# Patient Record
Sex: Female | Born: 1955
Health system: Southern US, Community
[De-identification: ages and names within clinical notes are randomized; demographics above are authoritative.]

## PROBLEM LIST (undated history)

## (undated) DIAGNOSIS — I10 Essential (primary) hypertension: Secondary | ICD-10-CM

## (undated) DIAGNOSIS — M1990 Unspecified osteoarthritis, unspecified site: Secondary | ICD-10-CM

## (undated) DIAGNOSIS — E039 Hypothyroidism, unspecified: Secondary | ICD-10-CM

## (undated) DIAGNOSIS — G4730 Sleep apnea, unspecified: Secondary | ICD-10-CM

## (undated) DIAGNOSIS — T7840XA Allergy, unspecified, initial encounter: Secondary | ICD-10-CM

## (undated) DIAGNOSIS — K432 Incisional hernia without obstruction or gangrene: Secondary | ICD-10-CM

## (undated) DIAGNOSIS — R140 Abdominal distension (gaseous): Secondary | ICD-10-CM

## (undated) DIAGNOSIS — E079 Disorder of thyroid, unspecified: Secondary | ICD-10-CM

## (undated) DIAGNOSIS — F419 Anxiety disorder, unspecified: Secondary | ICD-10-CM

## (undated) DIAGNOSIS — J189 Pneumonia, unspecified organism: Secondary | ICD-10-CM

## (undated) DIAGNOSIS — K219 Gastro-esophageal reflux disease without esophagitis: Secondary | ICD-10-CM

## (undated) DIAGNOSIS — J302 Other seasonal allergic rhinitis: Secondary | ICD-10-CM

## (undated) DIAGNOSIS — E785 Hyperlipidemia, unspecified: Secondary | ICD-10-CM

## (undated) DIAGNOSIS — E119 Type 2 diabetes mellitus without complications: Secondary | ICD-10-CM

## (undated) DIAGNOSIS — R011 Cardiac murmur, unspecified: Secondary | ICD-10-CM

## (undated) HISTORY — DX: Anxiety disorder, unspecified: F41.9

## (undated) HISTORY — DX: Allergy, unspecified, initial encounter: T78.40XA

## (undated) HISTORY — DX: Essential (primary) hypertension: I10

## (undated) HISTORY — PX: CARDIAC CATHETERIZATION: SHX172

## (undated) HISTORY — DX: Disorder of thyroid, unspecified: E07.9

## (undated) HISTORY — DX: Abdominal distension (gaseous): R14.0

## (undated) HISTORY — DX: Incisional hernia without obstruction or gangrene: K43.2

## (undated) HISTORY — DX: Cardiac murmur, unspecified: R01.1

## (undated) HISTORY — DX: Unspecified osteoarthritis, unspecified site: M19.90

## (undated) HISTORY — DX: Other seasonal allergic rhinitis: J30.2

## (undated) HISTORY — DX: Type 2 diabetes mellitus without complications: E11.9

## (undated) HISTORY — DX: Gastro-esophageal reflux disease without esophagitis: K21.9

## (undated) HISTORY — DX: Hyperlipidemia, unspecified: E78.5

---

## 1980-08-24 HISTORY — PX: APPENDECTOMY: SHX54

## 1999-01-14 ENCOUNTER — Ambulatory Visit (HOSPITAL_COMMUNITY): Admission: RE | Admit: 1999-01-14 | Discharge: 1999-01-14 | Payer: Self-pay | Admitting: Internal Medicine

## 1999-01-21 ENCOUNTER — Encounter: Payer: Self-pay | Admitting: *Deleted

## 1999-01-21 ENCOUNTER — Inpatient Hospital Stay (HOSPITAL_COMMUNITY): Admission: EM | Admit: 1999-01-21 | Discharge: 1999-01-23 | Payer: Self-pay | Admitting: *Deleted

## 1999-11-18 ENCOUNTER — Emergency Department (HOSPITAL_COMMUNITY): Admission: EM | Admit: 1999-11-18 | Discharge: 1999-11-18 | Payer: Self-pay | Admitting: Emergency Medicine

## 2001-11-24 ENCOUNTER — Ambulatory Visit (HOSPITAL_BASED_OUTPATIENT_CLINIC_OR_DEPARTMENT_OTHER): Admission: RE | Admit: 2001-11-24 | Discharge: 2001-11-24 | Payer: Self-pay | Admitting: Internal Medicine

## 2002-08-24 HISTORY — PX: HERNIA REPAIR: SHX51

## 2002-10-04 ENCOUNTER — Other Ambulatory Visit: Admission: RE | Admit: 2002-10-04 | Discharge: 2002-10-04 | Payer: Self-pay | Admitting: Obstetrics and Gynecology

## 2002-10-18 ENCOUNTER — Ambulatory Visit: Admission: RE | Admit: 2002-10-18 | Discharge: 2002-10-18 | Payer: Self-pay | Admitting: General Surgery

## 2002-10-30 ENCOUNTER — Encounter (HOSPITAL_BASED_OUTPATIENT_CLINIC_OR_DEPARTMENT_OTHER): Payer: Self-pay | Admitting: General Surgery

## 2002-10-30 ENCOUNTER — Ambulatory Visit (HOSPITAL_COMMUNITY): Admission: RE | Admit: 2002-10-30 | Discharge: 2002-10-30 | Payer: Self-pay | Admitting: General Surgery

## 2002-10-30 ENCOUNTER — Encounter (INDEPENDENT_AMBULATORY_CARE_PROVIDER_SITE_OTHER): Payer: Self-pay | Admitting: *Deleted

## 2003-07-25 ENCOUNTER — Emergency Department (HOSPITAL_COMMUNITY): Admission: EM | Admit: 2003-07-25 | Discharge: 2003-07-25 | Payer: Self-pay | Admitting: Emergency Medicine

## 2004-08-24 HISTORY — PX: KNEE SURGERY: SHX244

## 2005-02-02 ENCOUNTER — Other Ambulatory Visit: Admission: RE | Admit: 2005-02-02 | Discharge: 2005-02-02 | Payer: Self-pay | Admitting: Obstetrics and Gynecology

## 2005-04-06 ENCOUNTER — Ambulatory Visit: Payer: Self-pay | Admitting: Internal Medicine

## 2005-11-13 ENCOUNTER — Ambulatory Visit: Payer: Self-pay | Admitting: Internal Medicine

## 2006-04-27 ENCOUNTER — Ambulatory Visit: Payer: Self-pay | Admitting: Internal Medicine

## 2006-07-26 ENCOUNTER — Ambulatory Visit: Payer: Self-pay | Admitting: Pulmonary Disease

## 2006-10-22 ENCOUNTER — Ambulatory Visit: Payer: Self-pay | Admitting: Pulmonary Disease

## 2007-04-08 ENCOUNTER — Encounter: Admission: RE | Admit: 2007-04-08 | Discharge: 2007-04-08 | Payer: Self-pay | Admitting: Obstetrics and Gynecology

## 2007-10-14 ENCOUNTER — Ambulatory Visit: Payer: Self-pay | Admitting: Internal Medicine

## 2007-10-14 DIAGNOSIS — R7309 Other abnormal glucose: Secondary | ICD-10-CM | POA: Insufficient documentation

## 2007-10-14 DIAGNOSIS — I152 Hypertension secondary to endocrine disorders: Secondary | ICD-10-CM | POA: Insufficient documentation

## 2007-10-14 DIAGNOSIS — I10 Essential (primary) hypertension: Secondary | ICD-10-CM

## 2007-10-14 DIAGNOSIS — K432 Incisional hernia without obstruction or gangrene: Secondary | ICD-10-CM | POA: Insufficient documentation

## 2007-10-14 DIAGNOSIS — M1990 Unspecified osteoarthritis, unspecified site: Secondary | ICD-10-CM | POA: Insufficient documentation

## 2007-10-17 ENCOUNTER — Ambulatory Visit: Payer: Self-pay | Admitting: Internal Medicine

## 2007-10-17 DIAGNOSIS — I259 Chronic ischemic heart disease, unspecified: Secondary | ICD-10-CM | POA: Insufficient documentation

## 2007-10-17 DIAGNOSIS — I10 Essential (primary) hypertension: Secondary | ICD-10-CM | POA: Insufficient documentation

## 2007-10-19 LAB — CONVERTED CEMR LAB
ALT: 30 units/L (ref 0–35)
AST: 23 units/L (ref 0–37)
Albumin: 4.1 g/dL (ref 3.5–5.2)
Alkaline Phosphatase: 86 units/L (ref 39–117)
BUN: 10 mg/dL (ref 6–23)
Basophils Absolute: 0.1 10*3/uL (ref 0.0–0.1)
Basophils Relative: 1.3 % — ABNORMAL HIGH (ref 0.0–1.0)
Bilirubin Urine: NEGATIVE
Bilirubin, Direct: 0.1 mg/dL (ref 0.0–0.3)
CO2: 29 meq/L (ref 19–32)
Calcium: 9.4 mg/dL (ref 8.4–10.5)
Chloride: 104 meq/L (ref 96–112)
Cholesterol: 170 mg/dL (ref 0–200)
Creatinine, Ser: 0.6 mg/dL (ref 0.4–1.2)
Eosinophils Absolute: 0.3 10*3/uL (ref 0.0–0.6)
Eosinophils Relative: 4.6 % (ref 0.0–5.0)
GFR calc Af Amer: 136 mL/min
GFR calc non Af Amer: 112 mL/min
Glucose, Bld: 109 mg/dL — ABNORMAL HIGH (ref 70–99)
HCT: 43.4 % (ref 36.0–46.0)
HDL: 39.4 mg/dL (ref >39.0)
Hemoglobin, Urine: NEGATIVE
Hemoglobin: 14.6 g/dL (ref 12.0–15.0)
Hgb A1c MFr Bld: 6 % (ref 4.6–6.0)
Ketones, ur: NEGATIVE mg/dL
LDL Cholesterol: 102 mg/dL — ABNORMAL HIGH (ref 0–99)
Leukocytes, UA: NEGATIVE
Lymphocytes Relative: 35.5 % (ref 12.0–46.0)
MCHC: 33.8 g/dL (ref 30.0–36.0)
MCV: 85.4 fL (ref 78.0–100.0)
Monocytes Absolute: 0.4 10*3/uL (ref 0.2–0.7)
Monocytes Relative: 6.5 % (ref 3.0–11.0)
Neutro Abs: 3 10*3/uL (ref 1.4–7.7)
Neutrophils Relative %: 52.1 % (ref 43.0–77.0)
Nitrite: NEGATIVE
Platelets: 242 10*3/uL (ref 150–400)
Potassium: 4.4 meq/L (ref 3.5–5.1)
RBC: 5.08 M/uL (ref 3.87–5.11)
RDW: 13 % (ref 11.5–14.6)
Sodium: 139 meq/L (ref 135–145)
Specific Gravity, Urine: 1.025 (ref 1.000–1.03)
TSH: 2.87 microintl units/mL (ref 0.35–5.50)
Total Bilirubin: 0.7 mg/dL (ref 0.3–1.2)
Total CHOL/HDL Ratio: 4.3
Total Protein, Urine: NEGATIVE mg/dL
Total Protein: 7.3 g/dL (ref 6.0–8.3)
Triglycerides: 142 mg/dL (ref 0–149)
Urine Glucose: NEGATIVE mg/dL
Urobilinogen, UA: 0.2 (ref 0.0–1.0)
VLDL: 28 mg/dL (ref 0–40)
WBC: 5.9 10*3/uL (ref 4.5–10.5)
pH: 5.5 (ref 5.0–8.0)

## 2008-05-30 ENCOUNTER — Encounter: Payer: Self-pay | Admitting: Internal Medicine

## 2008-08-24 HISTORY — PX: CHOLECYSTECTOMY: SHX55

## 2009-05-17 ENCOUNTER — Encounter: Admission: RE | Admit: 2009-05-17 | Discharge: 2009-05-17 | Payer: Self-pay | Admitting: General Surgery

## 2009-07-01 ENCOUNTER — Ambulatory Visit (HOSPITAL_COMMUNITY): Admission: RE | Admit: 2009-07-01 | Discharge: 2009-07-02 | Payer: Self-pay | Admitting: General Surgery

## 2009-09-01 ENCOUNTER — Observation Stay (HOSPITAL_COMMUNITY): Admission: EM | Admit: 2009-09-01 | Discharge: 2009-09-01 | Payer: Self-pay | Admitting: Emergency Medicine

## 2009-09-01 ENCOUNTER — Encounter (INDEPENDENT_AMBULATORY_CARE_PROVIDER_SITE_OTHER): Payer: Self-pay | Admitting: Emergency Medicine

## 2009-09-01 ENCOUNTER — Ambulatory Visit: Payer: Self-pay | Admitting: Vascular Surgery

## 2009-10-22 ENCOUNTER — Encounter (INDEPENDENT_AMBULATORY_CARE_PROVIDER_SITE_OTHER): Payer: Self-pay | Admitting: *Deleted

## 2010-09-25 NOTE — Letter (Signed)
Summary: LEC Referral (unable to schedule) Notification  [facility]  [address] [place], [place] [address]   Phone: [phone]  Fax: [phone]      October 22, 2009 Katherine Sanchez 1956-07-13 MRN: [id number]   [facility] [address] [place], [place]  [address]   Dear A. MCCLUNG, PA-C:   Thank you for your kind referral of the above patient. We have attempted to schedule the recommended COLONOSCOPY but have been unable to schedule because:  _X_ The patient was not available by phone and/or has not returned our calls.  __ The patient declined to schedule the procedure at this time.  We appreciate the referral and hope that we will have the opportunity to treat this patient in the future.    Sincerely,   [facility]  Vania Rea. Jarold Motto M.D. Hedwig Morton. Juanda Chance M.D. Venita Lick. Russella Dar M.D. Wilhemina Bonito. Marina Goodell M.D. Barbette Hair. Arlyce Dice M.D. Iva Boop M.D. Cheron Every.D.

## 2010-11-26 LAB — DIFFERENTIAL
Basophils Absolute: 0 10*3/uL (ref 0.0–0.1)
Lymphocytes Relative: 36 % (ref 12–46)
Monocytes Absolute: 0.4 10*3/uL (ref 0.1–1.0)
Neutro Abs: 2.8 10*3/uL (ref 1.7–7.7)

## 2010-11-26 LAB — CBC
Hemoglobin: 14.6 g/dL (ref 12.0–15.0)
Platelets: 234 10*3/uL (ref 150–400)
RDW: 12.3 % (ref 11.5–15.5)

## 2010-11-26 LAB — BASIC METABOLIC PANEL
Calcium: 9.6 mg/dL (ref 8.4–10.5)
GFR calc Af Amer: >60 mL/min (ref >60)
GFR calc non Af Amer: >60 mL/min (ref >60)
Glucose, Bld: 111 mg/dL — ABNORMAL HIGH (ref 70–99)
Sodium: 139 mEq/L (ref 135–145)

## 2011-01-09 NOTE — Assessment & Plan Note (Signed)
[facility]                             PULMONARY OFFICE NOTE   NAME:Katherine Sanchez, Katherine Sanchez                    MRN:          [id number]  DATE:07/26/2006                            DOB:          [birth year]    REFERRING PHYSICIAN:  Self   HISTORY OF PRESENT ILLNESS:  The patient is a 55 year old female who  comes in today on self referral for evaluation of obstructive sleep  apnea. The patient was diagnosed with mild sleep apnea in 2003 and  decided to work on weight loss in order to treat this. I have not seen  the patient since that time and comes in today where her story has  gotten much worse. She has been having pauses in her breathing during  sleep as well as startles during the night and frequent awakenings. She  typically goes to bed between 11 and 6 a.m. and gets up at 6:15 to start  her day. It typically takes her approximately 30 minutes to fall asleep.  The patient states that she will fall asleep with any inactivity in the  evenings and has only rare sleepiness with driving. She denies any leg  jerks or other restless leg syndrome symptoms. Of note, the patient's  weight is up about 4 pounds since the last visit.   PAST MEDICAL HISTORY:  1. Hypertension.  2. History of dysrhythmias.  3. Status post appendectomy.  4. History of umbilical hernia repair.   CURRENT MEDICATIONS:  1. Lotrel 10/20 one daily.  2. Lorazepam 1 mg daily.  3. Aspirin 81 mg daily.   She has no known drug allergies.   SOCIAL HISTORY:  She has never smoked, she is married and has children.   FAMILY HISTORY:  Remarkable for her son having had asthma otherwise no  history on first degree relatives.   REVIEW OF SYSTEMS:  As per history of present illness. Also see patient  intake form documented in the chart.   PHYSICAL EXAMINATION:  GENERAL:  She is an overweight female in no acute  distress.  VITAL SIGNS:  Blood pressure is 118/76, pulse 66, temperature is 98,  weight is 199 pounds. O2 saturations on room air is 97%.  HEENT:  Pupils equal round and reactive to light and accommodation.  Extraocular muscles are intact. Nares shows mild septal deviation to the  left and there is elongation of the soft palate and uvula.  NECK:  Supple without JVD or lymphadenopathy. There is no palpable  thyromegaly.  CHEST:  Totally clear.  CARDIAC:  Reveals regular rate and rhythm. No murmurs, rubs or gallops.  ABDOMEN:  Soft, nontender with good bowel sounds.  GENITAL/RECTAL/BREASTS:  Not done and not indicated.  EXTREMITIES:  Lower extremities are without edema, good pulses distally,  no calf tenderness.  NEUROLOGIC:  Alert and oriented with no evidence of motor deficits.   IMPRESSION:  History of obstructive sleep apnea which was mild in  nature. Certainly based on her current symptomatology, I would think  that her degree of sleep apnea has worsened. At this point in time, she  is willing to try  CPAP and will go ahead and initiate her on this. She  also needs to work aggressively on weight loss.   PLAN:  1. Initial CPAP at 10 cm.  2. Follow up in 4 weeks or sooner if there are problems.  3. The patient will eventually need pressure optimization.  4. Work on weight loss.     Barbaraann Share, MD,FCCP  Electronically Signed    [facility]  DD: 08/31/2006  DT: 09/01/2006  Job #: [id number]

## 2011-01-09 NOTE — Op Note (Signed)
   NAME:  Katherine Sanchez, Katherine Sanchez                       ACCOUNT NO.:  [id number]   MEDICAL RECORD NO.:  [id number]                   PATIENT TYPE:  OIB   LOCATION:  2860                                 FACILITY:  [facility]   PHYSICIAN:  Leonie Man, M.D.                DATE OF BIRTH:  [birth year]   DATE OF PROCEDURE:  10/30/2002  DATE OF DISCHARGE:                                 OPERATIVE REPORT   PREOPERATIVE DIAGNOSIS:  Incarcerated umbilical hernia.   POSTOPERATIVE DIAGNOSIS:  Incarcerated umbilical hernia.   PROCEDURE:  Repair of incarcerated umbilical hernia with mesh.   SURGEON:  Leonie Man, M.D.   ASSISTANT:  Magnus Ivan, RN FA.   ANESTHESIA:  General.   INDICATIONS FOR PROCEDURE:  The patient is a 55 year old woman who presented  with umbilical pain, no symptoms of bowel obstruction, but with a persistent  umbilical mass which is tender.  She comes to the operating room after the  risks and potential benefits of surgery are fully discussed.  All questions  answered and consent obtained.   DESCRIPTION OF PROCEDURE:  Following induction of satisfactory general  anesthesia, the patient is positioned supinely. The abdomen was prepped and  draped routinely to be included in a sterile operative field.  I infiltrated  the repair umbilical region with 0.5% Marcaine with epinephrine. An  infraumbilical incision is made in a semicircular fashion, raising the  umbilical skin as a flap, carrying it cephalad, and dissecting the hernia  sac from off of the umbilicus and carrying the dissection down and around  the hernia sac on all sides down to the fascia.  The fascia is scored at its  base and the umbilical hernia sac is removed in its entirety along with a  portion of the incarcerated omentum, which was divided with electrocautery.  The remainder of the omentum was then reduced into the peritoneal cavity.  The defect is repaired with a mesh plug placed into the defect and using  interrupted 0 Novafil sutures, sewing the mesh into the defect.  At the end  of the repair, the repair was felt to be intact.  Needle, sponge, and  instrument count correct.  The subcutaneous tissues were closed with a  running 2-0 Vicryl suture and the skin was closed with a running 4-0  Monocryl suture reinforced with Steri-Strips. A sterile dressing was  applied. The anesthetic was reversed. The patient was removed from the  operating room to the recovery room in stable condition. She tolerated the  procedure well.                                               Leonie Man, M.D.    PB/MEDQ  D:  10/30/2002  T:  10/30/2002  Job:  [id number]

## 2011-07-08 ENCOUNTER — Ambulatory Visit (INDEPENDENT_AMBULATORY_CARE_PROVIDER_SITE_OTHER): Payer: Federal, State, Local not specified - PPO | Admitting: Surgery

## 2011-07-08 ENCOUNTER — Encounter (INDEPENDENT_AMBULATORY_CARE_PROVIDER_SITE_OTHER): Payer: Self-pay | Admitting: Surgery

## 2011-07-08 VITALS — BP 158/96 | HR 80 | Temp 98.2°F | Resp 18 | Ht 62.0 in | Wt 217.8 lb

## 2011-07-08 DIAGNOSIS — E669 Obesity, unspecified: Secondary | ICD-10-CM | POA: Insufficient documentation

## 2011-07-08 DIAGNOSIS — K432 Incisional hernia without obstruction or gangrene: Secondary | ICD-10-CM

## 2011-07-08 NOTE — Progress Notes (Signed)
Subjective:     Patient ID: Katherine Sanchez, female   DOB: [birth year], 55 y.o.   MRN: [id number]  HPI  Patient Care Team: Sonda Primes, MD as PCP - General  This patient is a 55 y.o.female who presents today for surgical evaluation.   Reason for visit: Recurrent periumbilical swelling. Concern of recurrent hernia.  Patient is a pleasant obese female who's had a periumbilical ventral hernia. She had an open repair with mesh in 2004. She had a half cholecystectomy and primary re-repair in 2010. She had to take care of her ailing mother last year. This required a lot of lifting. She worries that a hernia came back after that point.  She notes that she has pain and swelling with activity.  She has been trying to lose weight. She woke her dogs several times a day. They have adjusted to a low fiber discharge diet. She has been losing about the 10 pounds over the past month. However, while her activities better after her knee surgery she still limited secondary to this pain. She wishes to have consideration of surgery for repair.  She has some friends whom are nurses that recommended me.  She walks regularly without difficulty. Daily bowel movements on a fiber regimen. No history of skin infections.  Past Medical History  Diagnosis Date  . Arthritis   . Hypertension   . Hyperlipidemia   . Thyroid disease   . Heart murmur   . Abdominal distension   . Constipation   . Incisional hernia     Past Surgical History  Procedure Date  . Appendectomy 1982  . Knee surgery 2006    left - arthroscopic  . Cholecystectomy 2010    lap with open redo umbilical hernia repair.  Dr. Zachery Dakins  . Hernia repair 2004    umbilical open w mesh plug. Dr. Lurene Shadow    History   Social History  . Marital Status: Married    Spouse Name: N/A    Number of Children: N/A  . Years of Education: N/A   Occupational History  . Not on file.   Social History Main Topics  . Smoking status: Former Smoker   Quit date: 07/07/1978  . Smokeless tobacco: Never Used  . Alcohol Use: No  . Drug Use: No  . Sexually Active: Not on file   Other Topics Concern  . Not on file   Social History Narrative  . No narrative on file    Family History  Problem Relation Age of Onset  . Cancer Mother     colon  . Heart disease Mother 94    congestive heart failure  . Other Father 58    car accident  . Cancer Maternal Grandmother     ovarian    Current outpatient prescriptions:levothyroxine (SYNTHROID, LEVOTHROID) 25 MCG tablet, Daily., Disp: , Rfl: ;  meloxicam (MOBIC) 15 MG tablet, as needed., Disp: , Rfl: ;  simvastatin (ZOCOR) 10 MG tablet, Take 10 mg by mouth at bedtime.  , Disp: , Rfl: ;  amLODipine-benazepril (LOTREL) 10-20 MG per capsule, 1 capsule daily. , Disp: , Rfl:   No Known Allergies     Review of Systems  Constitutional: Negative for fever, chills, diaphoresis, appetite change and fatigue.  HENT: Negative for ear pain, sore throat, trouble swallowing, neck pain and ear discharge.   Eyes: Negative for photophobia, discharge and visual disturbance.  Respiratory: Negative for cough, choking, chest tightness and shortness of breath.   Cardiovascular: Negative for chest  pain and palpitations.  Gastrointestinal: Positive for abdominal pain. Negative for nausea, vomiting, diarrhea, constipation, blood in stool, abdominal distention, anal bleeding and rectal pain.  Genitourinary: Negative for dysuria, frequency and difficulty urinating.  Musculoskeletal: Negative for myalgias and gait problem.  Skin: Negative for color change, pallor and rash.  Neurological: Negative for dizziness, speech difficulty, weakness and numbness.  Hematological: Negative for adenopathy.  Psychiatric/Behavioral: Negative for confusion and agitation. The patient is not nervous/anxious.        Objective:   Physical Exam  Constitutional: She is oriented to person, place, and time. She appears well-developed and  well-nourished. No distress.  HENT:  Head: Normocephalic.  Mouth/Throat: Oropharynx is clear and moist. No oropharyngeal exudate.  Eyes: Conjunctivae and EOM are normal. Pupils are equal, round, and reactive to light. No scleral icterus.  Neck: Normal range of motion. Neck supple. No tracheal deviation present.  Cardiovascular: Normal rate, regular rhythm and intact distal pulses.   Pulmonary/Chest: Effort normal and breath sounds normal. No respiratory distress. She exhibits no tenderness.  Abdominal: Soft. She exhibits mass. She exhibits no distension. There is tenderness. Hernia confirmed negative in the right inguinal area and confirmed negative in the left inguinal area.       6x6cm periumb mass - reduces while supine.  +TTP  Morbidly obese w panniculis  Genitourinary: No vaginal discharge found.  Musculoskeletal: Normal range of motion. She exhibits no tenderness.  Lymphadenopathy:    She has no cervical adenopathy.       Right: No inguinal adenopathy present.       Left: No inguinal adenopathy present.  Neurological: She is alert and oriented to person, place, and time. No cranial nerve deficit. She exhibits normal muscle tone. Coordination normal.  Skin: Skin is warm and dry. No rash noted. She is not diaphoretic. No erythema.  Psychiatric: Her behavior is normal. Judgment and thought content normal.       Pleasant, tearful talking about her recently deceased mother but consolable       Assessment:     Recurrent VWH    Plan:     She would benefit from repair. I think he would be good to do a laparoscopic approach to make sure that she does not have multiple small Swiss cheese hernias. I suspect given her obesity and size that she will be seen an overnight stay. I spent some time trying to console her about the death of her mother. I noted that she had to do what she needed take care of her mother. Hopefully, we can help take care of this problem. I agreed with her efforts to  lose weight and have a healthier lifestyle.  The anatomy & physiology of the abdominal wall was discussed.  The pathophysiology of hernias was discussed.  Natural history risks without surgery of enlargement, pain, incarceration & strangulation was discussed.   Contributors to complications such as smoking, obesity, diabetes, prior surgery, etc were discussed.  I feel the risks of no intervention will lead to serious problems that outweigh the operative risks; therefore, I recommended surgery to reduce and repair the hernia.  I explained laparoscopic techniques with possible need for an open approach.  I noted the probable use of mesh to patch and/or buttress hernia repair  Risks such as bleeding, infection, abscess, need for further treatment, heart attack, death, and other risks were discussed.  Goals of post-operative recovery were discussed as well.  Possibility that this will not correct all symptoms was explained.  I stressed the importance of low-impact activity, aggressive pain control, avoiding constipation, & not pushing through pain to minimize risk of post-operative chronic pain or injury. Possibility of reherniation was discussed.  We will work to minimize complications.   An educational handout further explaining the pathology & treatment options was given as well.  Questions were answered.  The patient expresses understanding & wishes to proceed with surgery.

## 2011-08-06 ENCOUNTER — Encounter (HOSPITAL_COMMUNITY): Payer: Self-pay | Admitting: Pharmacy Technician

## 2011-08-11 ENCOUNTER — Other Ambulatory Visit: Payer: Self-pay

## 2011-08-11 ENCOUNTER — Encounter (HOSPITAL_COMMUNITY)
Admission: RE | Admit: 2011-08-11 | Discharge: 2011-08-11 | Disposition: A | Discharge status: Home / Self Care | Payer: Federal, State, Local not specified - PPO | Source: Ambulatory Visit | Attending: Surgery | Admitting: Surgery

## 2011-08-11 ENCOUNTER — Encounter (HOSPITAL_COMMUNITY): Payer: Self-pay

## 2011-08-11 ENCOUNTER — Ambulatory Visit (HOSPITAL_COMMUNITY)
Admission: RE | Admit: 2011-08-11 | Discharge: 2011-08-11 | Disposition: A | Discharge status: Home / Self Care | Payer: Federal, State, Local not specified - PPO | Source: Ambulatory Visit | Attending: Surgery | Admitting: Surgery

## 2011-08-11 DIAGNOSIS — Z01812 Encounter for preprocedural laboratory examination: Secondary | ICD-10-CM | POA: Insufficient documentation

## 2011-08-11 DIAGNOSIS — R0789 Other chest pain: Secondary | ICD-10-CM | POA: Insufficient documentation

## 2011-08-11 DIAGNOSIS — M5380 Other specified dorsopathies, site unspecified: Secondary | ICD-10-CM | POA: Insufficient documentation

## 2011-08-11 DIAGNOSIS — K439 Ventral hernia without obstruction or gangrene: Secondary | ICD-10-CM | POA: Insufficient documentation

## 2011-08-11 DIAGNOSIS — Z01810 Encounter for preprocedural cardiovascular examination: Secondary | ICD-10-CM | POA: Insufficient documentation

## 2011-08-11 DIAGNOSIS — I10 Essential (primary) hypertension: Secondary | ICD-10-CM | POA: Insufficient documentation

## 2011-08-11 HISTORY — DX: Sleep apnea, unspecified: G47.30

## 2011-08-11 HISTORY — DX: Hypothyroidism, unspecified: E03.9

## 2011-08-11 HISTORY — DX: Pneumonia, unspecified organism: J18.9

## 2011-08-11 LAB — SURGICAL PCR SCREEN: MRSA, PCR: NEGATIVE

## 2011-08-11 NOTE — Pre-Procedure Instructions (Signed)
07/22/11 CBC  With DIFF and CMP on chart

## 2011-08-11 NOTE — Pre-Procedure Instructions (Signed)
08/11/11 Instructed pt to bring CPA mask and tubing day of surgery .

## 2011-08-11 NOTE — Pre-Procedure Instructions (Signed)
08/11/11 most recent office visit note for pulmonary - Dr Shelle Iron who handles CPAP is 07/26/2006 - on chart - CPAP settings are within note.  I called office to verify there was no updated info. None available per office personnel.

## 2011-08-11 NOTE — Patient Instructions (Signed)
20 Katherine Sanchez  08/11/2011   Your procedure is scheduled on:  08/13/2011 0800am-0950am  Report to [facility] Stay [facility] at 0600 AM.  Call this number if you have problems the morning of surgery: [phone]   Remember:   Do not eat food:After Midnight.  May have clear liquids:until Midnight .  Clear liquids include soda, tea, black coffee, apple or grape juice, broth.  Take these medicines the morning of surgery with A SIP OF WATER:    Do not wear jewelry, make-up or nail polish.  Do not wear lotions, powders, or perfumes.  Do not shave 48 hours prior to surgery.  Do not bring valuables to the hospital.  Contacts, dentures or bridgework may not be worn into surgery.  Leave suitcase in the car. After surgery it may be brought to your room.  For patients admitted to the hospital, checkout time is 11:00 AM the day of discharge.      Special Instructions: CHG Shower Use Special Wash: 1/2 bottle night before surgery and 1/2 bottle morning of surgery. shower chin to toes with CHG. Wash face and private parts with regular soap.    Please read over the following fact sheets that you were given: MRSA Information, coughing and deep breathing exercises, leg exercises

## 2011-08-13 ENCOUNTER — Encounter (HOSPITAL_COMMUNITY): Payer: Self-pay | Admitting: Anesthesiology

## 2011-08-13 ENCOUNTER — Encounter (HOSPITAL_COMMUNITY): Payer: Self-pay

## 2011-08-13 ENCOUNTER — Ambulatory Visit (HOSPITAL_COMMUNITY): Payer: Federal, State, Local not specified - PPO | Admitting: Anesthesiology

## 2011-08-13 ENCOUNTER — Observation Stay (HOSPITAL_COMMUNITY)
Admission: RE | Admit: 2011-08-13 | Discharge: 2011-08-14 | Disposition: A | Discharge status: Home / Self Care | Payer: Federal, State, Local not specified - PPO | Source: Ambulatory Visit | Attending: Surgery | Admitting: Surgery

## 2011-08-13 ENCOUNTER — Encounter (HOSPITAL_COMMUNITY)
Admission: RE | Disposition: A | Discharge status: Home / Self Care | Payer: Self-pay | Source: Ambulatory Visit | Attending: Surgery

## 2011-08-13 DIAGNOSIS — R109 Unspecified abdominal pain: Secondary | ICD-10-CM | POA: Insufficient documentation

## 2011-08-13 DIAGNOSIS — K420 Umbilical hernia with obstruction, without gangrene: Principal | ICD-10-CM | POA: Insufficient documentation

## 2011-08-13 DIAGNOSIS — G4730 Sleep apnea, unspecified: Secondary | ICD-10-CM | POA: Insufficient documentation

## 2011-08-13 DIAGNOSIS — I10 Essential (primary) hypertension: Secondary | ICD-10-CM | POA: Diagnosis present

## 2011-08-13 DIAGNOSIS — E785 Hyperlipidemia, unspecified: Secondary | ICD-10-CM | POA: Insufficient documentation

## 2011-08-13 DIAGNOSIS — M1990 Unspecified osteoarthritis, unspecified site: Secondary | ICD-10-CM | POA: Diagnosis present

## 2011-08-13 DIAGNOSIS — K432 Incisional hernia without obstruction or gangrene: Secondary | ICD-10-CM | POA: Diagnosis present

## 2011-08-13 DIAGNOSIS — K436 Other and unspecified ventral hernia with obstruction, without gangrene: Secondary | ICD-10-CM

## 2011-08-13 DIAGNOSIS — E039 Hypothyroidism, unspecified: Secondary | ICD-10-CM | POA: Insufficient documentation

## 2011-08-13 HISTORY — PX: VENTRAL HERNIA REPAIR: SHX424

## 2011-08-13 LAB — CBC
HCT: 40.1 % (ref 36.0–46.0)
MCV: 83.7 fL (ref 78.0–100.0)
RBC: 4.79 MIL/uL (ref 3.87–5.11)
WBC: 10.2 10*3/uL (ref 4.0–10.5)

## 2011-08-13 LAB — CREATININE, SERUM: GFR calc Af Amer: >90 mL/min (ref >90)

## 2011-08-13 SURGERY — REPAIR, HERNIA, VENTRAL, LAPAROSCOPIC
Anesthesia: General | Site: Abdomen | Wound class: Clean

## 2011-08-13 MED ORDER — LACTATED RINGERS IV SOLN
INTRAVENOUS | Status: DC
  Administered 2011-08-13: 75 mL via INTRAVENOUS

## 2011-08-13 MED ORDER — TETRAHYDROZOLINE HCL 0.05 % OP SOLN
1.0000 [drp] | Freq: Two times a day (BID) | OPHTHALMIC | Status: DC | PRN
  Filled 2011-08-13: qty 15

## 2011-08-13 MED ORDER — KETOROLAC TROMETHAMINE 30 MG/ML IJ SOLN
INTRAMUSCULAR | Status: DC | PRN
  Administered 2011-08-13: 30 mg via INTRAVENOUS

## 2011-08-13 MED ORDER — NEOSTIGMINE METHYLSULFATE 1 MG/ML IJ SOLN
INTRAMUSCULAR | Status: DC | PRN
  Administered 2011-08-13: 3 mg via INTRAVENOUS

## 2011-08-13 MED ORDER — SUCCINYLCHOLINE CHLORIDE 20 MG/ML IJ SOLN
INTRAMUSCULAR | Status: DC | PRN
  Administered 2011-08-13: 100 mg via INTRAVENOUS

## 2011-08-13 MED ORDER — STERILE WATER FOR IRRIGATION IR SOLN
Status: DC | PRN
  Administered 2011-08-13: 1500 mL

## 2011-08-13 MED ORDER — MIDAZOLAM HCL 5 MG/5ML IJ SOLN
INTRAMUSCULAR | Status: DC | PRN
  Administered 2011-08-13 (×2): 1 mg via INTRAVENOUS

## 2011-08-13 MED ORDER — SIMVASTATIN 20 MG PO TABS
20.0000 mg | ORAL_TABLET | Freq: Every day | ORAL | Status: DC
  Administered 2011-08-13: 20 mg via ORAL
  Filled 2011-08-13 (×2): qty 1

## 2011-08-13 MED ORDER — BUPIVACAINE 0.25 % ON-Q PUMP DUAL CATH 300 ML
300.0000 mL | INJECTION | Status: DC
  Administered 2011-08-13: 300 mL
  Filled 2011-08-13: qty 300

## 2011-08-13 MED ORDER — LACTATED RINGERS IR SOLN
Status: DC | PRN
  Administered 2011-08-13: 1000 mL

## 2011-08-13 MED ORDER — BUPIVACAINE 0.25 % ON-Q PUMP DUAL CATH 300 ML: 300.0000 mL | INJECTION | Status: DC

## 2011-08-13 MED ORDER — GUAIFENESIN-DM 100-10 MG/5ML PO SYRP: 15.0000 mL | ORAL_SOLUTION | ORAL | Status: DC | PRN

## 2011-08-13 MED ORDER — NAPROXEN 500 MG PO TABS
500.0000 mg | ORAL_TABLET | Freq: Two times a day (BID) | ORAL | Status: DC
  Administered 2011-08-13 – 2011-08-14 (×2): 500 mg via ORAL
  Filled 2011-08-13 (×3): qty 1

## 2011-08-13 MED ORDER — HEPARIN SODIUM (PORCINE) 5000 UNIT/ML IJ SOLN
5000.0000 [IU] | Freq: Three times a day (TID) | INTRAMUSCULAR | Status: DC
  Administered 2011-08-13 – 2011-08-14 (×3): 5000 [IU] via SUBCUTANEOUS
  Filled 2011-08-13 (×5): qty 1

## 2011-08-13 MED ORDER — METOPROLOL TARTRATE 1 MG/ML IV SOLN
5.0000 mg | Freq: Four times a day (QID) | INTRAVENOUS | Status: DC | PRN
  Filled 2011-08-13: qty 5

## 2011-08-13 MED ORDER — 0.9 % SODIUM CHLORIDE (POUR BTL) OPTIME
TOPICAL | Status: DC | PRN
  Administered 2011-08-13: 1000 mL

## 2011-08-13 MED ORDER — CEFAZOLIN SODIUM 1-5 GM-% IV SOLN
INTRAVENOUS | Status: DC | PRN
  Administered 2011-08-13: 2 g via INTRAVENOUS

## 2011-08-13 MED ORDER — CISATRACURIUM BESYLATE 2 MG/ML IV SOLN
INTRAVENOUS | Status: DC | PRN
  Administered 2011-08-13: 6 mg via INTRAVENOUS
  Administered 2011-08-13: 2 mg via INTRAVENOUS

## 2011-08-13 MED ORDER — HYDROMORPHONE HCL PF 1 MG/ML IJ SOLN
0.5000 mg | INTRAMUSCULAR | Status: DC | PRN
  Administered 2011-08-13 (×2): 0.5 mg via INTRAVENOUS

## 2011-08-13 MED ORDER — DIPHENHYDRAMINE HCL 50 MG/ML IJ SOLN: 12.5000 mg | Freq: Four times a day (QID) | INTRAMUSCULAR | Status: DC | PRN

## 2011-08-13 MED ORDER — PSYLLIUM 95 % PO PACK
1.0000 | PACK | Freq: Two times a day (BID) | ORAL | Status: DC
  Administered 2011-08-13 – 2011-08-14 (×3): 1 via ORAL
  Filled 2011-08-13 (×4): qty 1

## 2011-08-13 MED ORDER — LORATADINE 10 MG PO TABS
10.0000 mg | ORAL_TABLET | Freq: Every day | ORAL | Status: DC
  Administered 2011-08-13 – 2011-08-14 (×2): 10 mg via ORAL
  Filled 2011-08-13 (×2): qty 1

## 2011-08-13 MED ORDER — LACTATED RINGERS IV SOLN
INTRAVENOUS | Status: DC | PRN
  Administered 2011-08-13 (×2): via INTRAVENOUS

## 2011-08-13 MED ORDER — ONDANSETRON HCL 4 MG/2ML IJ SOLN
INTRAMUSCULAR | Status: DC | PRN
  Administered 2011-08-13 (×2): 1 mg via INTRAVENOUS
  Administered 2011-08-13: 2 mg via INTRAVENOUS

## 2011-08-13 MED ORDER — FENTANYL CITRATE 0.05 MG/ML IJ SOLN
INTRAMUSCULAR | Status: DC | PRN
  Administered 2011-08-13 (×8): 50 ug via INTRAVENOUS

## 2011-08-13 MED ORDER — BUPIVACAINE-EPINEPHRINE 0.25% -1:200000 IJ SOLN
INTRAMUSCULAR | Status: DC | PRN
  Administered 2011-08-13: 50 mL

## 2011-08-13 MED ORDER — ALBUTEROL SULFATE (5 MG/ML) 0.5% IN NEBU: 2.5000 mg | INHALATION_SOLUTION | Freq: Four times a day (QID) | RESPIRATORY_TRACT | Status: DC | PRN

## 2011-08-13 MED ORDER — VITAMIN C 500 MG PO TABS
1000.0000 mg | ORAL_TABLET | Freq: Every day | ORAL | Status: DC
  Administered 2011-08-14: 1000 mg via ORAL
  Filled 2011-08-13: qty 2

## 2011-08-13 MED ORDER — LIP MEDEX EX OINT
1.0000 "application " | TOPICAL_OINTMENT | Freq: Two times a day (BID) | CUTANEOUS | Status: DC
  Administered 2011-08-13 – 2011-08-14 (×3): 1 via TOPICAL
  Filled 2011-08-13: qty 7

## 2011-08-13 MED ORDER — ALUM & MAG HYDROXIDE-SIMETH 200-200-20 MG/5ML PO SUSP: 30.0000 mL | Freq: Four times a day (QID) | ORAL | Status: DC | PRN

## 2011-08-13 MED ORDER — ACETAMINOPHEN 325 MG PO TABS: 325.0000 mg | ORAL_TABLET | Freq: Four times a day (QID) | ORAL | Status: DC | PRN

## 2011-08-13 MED ORDER — CEFAZOLIN SODIUM-DEXTROSE 2-3 GM-% IV SOLR: 2.0000 g | INTRAVENOUS | Status: DC

## 2011-08-13 MED ORDER — PROMETHAZINE HCL 25 MG/ML IJ SOLN: 6.2500 mg | INTRAMUSCULAR | Status: DC | PRN

## 2011-08-13 MED ORDER — GLYCOPYRROLATE 0.2 MG/ML IJ SOLN
INTRAMUSCULAR | Status: DC | PRN
  Administered 2011-08-13: .6 mg via INTRAVENOUS

## 2011-08-13 MED ORDER — EPHEDRINE SULFATE 50 MG/ML IJ SOLN
INTRAMUSCULAR | Status: DC | PRN
  Administered 2011-08-13 (×2): 5 mg via INTRAVENOUS

## 2011-08-13 MED ORDER — HYDROMORPHONE HCL PF 2 MG/ML IJ SOLN
0.5000 mg | INTRAMUSCULAR | Status: DC | PRN
  Administered 2011-08-13 (×2): 2 mg via INTRAVENOUS
  Administered 2011-08-13: 1 mg via INTRAVENOUS
  Administered 2011-08-14 (×2): 2 mg via INTRAVENOUS
  Filled 2011-08-13 (×6): qty 1

## 2011-08-13 MED ORDER — PROPOFOL 10 MG/ML IV EMUL
INTRAVENOUS | Status: DC | PRN
  Administered 2011-08-13: 150 mg via INTRAVENOUS

## 2011-08-13 MED ORDER — OXYCODONE HCL 5 MG PO TABS
5.0000 mg | ORAL_TABLET | ORAL | Status: DC | PRN
  Administered 2011-08-14 (×2): 10 mg via ORAL
  Filled 2011-08-13 (×3): qty 2

## 2011-08-13 MED ORDER — OXYCODONE HCL 5 MG PO TABS: 5.0000 mg | ORAL_TABLET | ORAL | Status: AC | PRN

## 2011-08-13 MED ORDER — DEXAMETHASONE SODIUM PHOSPHATE 10 MG/ML IJ SOLN
INTRAMUSCULAR | Status: DC | PRN
  Administered 2011-08-13: 10 mg via INTRAVENOUS

## 2011-08-13 MED ORDER — MELOXICAM 15 MG PO TABS
15.0000 mg | ORAL_TABLET | Freq: Every day | ORAL | Status: DC | PRN
  Filled 2011-08-13: qty 1

## 2011-08-13 MED ORDER — LIDOCAINE HCL (CARDIAC) 20 MG/ML IV SOLN
INTRAVENOUS | Status: DC | PRN
  Administered 2011-08-13: 80 mg via INTRAVENOUS

## 2011-08-13 MED ORDER — FLORA-Q PO CAPS
1.0000 | ORAL_CAPSULE | Freq: Every day | ORAL | Status: DC
  Administered 2011-08-13 – 2011-08-14 (×2): 1 via ORAL
  Filled 2011-08-13 (×2): qty 1

## 2011-08-13 MED ORDER — LACTATED RINGERS IV SOLN
INTRAVENOUS | Status: DC
  Administered 2011-08-13: 1000 mL via INTRAVENOUS

## 2011-08-13 MED ORDER — LEVOTHYROXINE SODIUM 25 MCG PO TABS
25.0000 ug | ORAL_TABLET | Freq: Every day | ORAL | Status: DC
  Administered 2011-08-14: 25 ug via ORAL
  Filled 2011-08-13: qty 1

## 2011-08-13 SURGICAL SUPPLY — 45 items
APPLIER CLIP 5 13 M/L LIGAMAX5 (MISCELLANEOUS)
BINDER ABD UNIV 12 45-62 (WOUND CARE) ×1 IMPLANT
BINDER ABDOMINAL 46IN 62IN (WOUND CARE) ×2
CANISTER SUCTION 2500CC (MISCELLANEOUS) ×2 IMPLANT
CATH KIT ON-Q SILVERSOAKER 10 (CATHETERS) ×4 IMPLANT
CLIP APPLIE 5 13 M/L LIGAMAX5 (MISCELLANEOUS) IMPLANT
CLOSURE STERI STRIP 1/2 X4 (GAUZE/BANDAGES/DRESSINGS) ×2 IMPLANT
CLOTH BEACON ORANGE TIMEOUT ST (SAFETY) ×2 IMPLANT
DECANTER SPIKE VIAL GLASS SM (MISCELLANEOUS) ×2 IMPLANT
DEVICE SECURE STRAP 25 ABSORB (INSTRUMENTS) ×4 IMPLANT
DEVICE TROCAR PUNCTURE CLOSURE (ENDOMECHANICALS) ×2 IMPLANT
DRAPE LAPAROSCOPIC ABDOMINAL (DRAPES) ×2 IMPLANT
DRSG TEGADERM 2-3/8X2-3/4 SM (GAUZE/BANDAGES/DRESSINGS) ×2 IMPLANT
DRSG TEGADERM 4X4.75 (GAUZE/BANDAGES/DRESSINGS) ×2 IMPLANT
ELECT REM PT RETURN 9FT ADLT (ELECTROSURGICAL) ×2
ELECTRODE REM PT RTRN 9FT ADLT (ELECTROSURGICAL) ×1 IMPLANT
FILTER SMOKE EVAC LAPAROSHD (FILTER) IMPLANT
GAUZE SPONGE 2X2 8PLY STRL LF (GAUZE/BANDAGES/DRESSINGS) ×1 IMPLANT
GLOVE ECLIPSE 8.0 STRL XLNG CF (GLOVE) ×2 IMPLANT
GLOVE INDICATOR 8.0 STRL GRN (GLOVE) ×4 IMPLANT
GOWN STRL NON-REIN LRG LVL3 (GOWN DISPOSABLE) ×2 IMPLANT
GOWN STRL REIN XL XLG (GOWN DISPOSABLE) ×4 IMPLANT
HAND ACTIVATED (MISCELLANEOUS) ×2 IMPLANT
KIT BASIN OR (CUSTOM PROCEDURE TRAY) ×2 IMPLANT
MESH PARIETEX 25X20 (Mesh General) ×2 IMPLANT
NEEDLE SPNL 22GX3.5 QUINCKE BK (NEEDLE) IMPLANT
NS IRRIG 1000ML POUR BTL (IV SOLUTION) ×2 IMPLANT
PEN SKIN MARKING BROAD (MISCELLANEOUS) ×2 IMPLANT
PENCIL BUTTON HOLSTER BLD 10FT (ELECTRODE) IMPLANT
SCISSORS LAP 5X35 DISP (ENDOMECHANICALS) ×2 IMPLANT
SET IRRIG TUBING LAPAROSCOPIC (IRRIGATION / IRRIGATOR) IMPLANT
SLEEVE Z-THREAD 5X100MM (TROCAR) ×4 IMPLANT
SPONGE GAUZE 2X2 STER 10/PKG (GAUZE/BANDAGES/DRESSINGS) ×1
STRIP CLOSURE SKIN 1/2X4 (GAUZE/BANDAGES/DRESSINGS) IMPLANT
SUT MNCRL AB 4-0 PS2 18 (SUTURE) ×2 IMPLANT
SUT PROLENE 1 CT 1 30 (SUTURE) ×14 IMPLANT
SUT VIC AB 2-0 UR6 27 (SUTURE) IMPLANT
TOWEL OR 17X26 10 PK STRL BLUE (TOWEL DISPOSABLE) ×2 IMPLANT
TRAY FOLEY CATH 14FRSI W/METER (CATHETERS) ×2 IMPLANT
TRAY LAP CHOLE (CUSTOM PROCEDURE TRAY) ×2 IMPLANT
TROCAR XCEL BLADELESS 5X75MML (TROCAR) ×2 IMPLANT
TROCAR Z-THREAD FIOS 11X100 BL (TROCAR) ×2 IMPLANT
TROCAR Z-THREAD FIOS 5X100MM (TROCAR) ×2 IMPLANT
TUBING INSUFFLATION 10FT LAP (TUBING) ×2 IMPLANT
TUNNELER SHEATH ON-Q 16GX12 DP (PAIN MANAGEMENT) ×2 IMPLANT

## 2011-08-13 NOTE — Brief Op Note (Signed)
08/13/2011  9:33 AM  PATIENT:  Katherine Sanchez  55 y.o. female  PRE-OPERATIVE DIAGNOSIS:  recurrent abdominal wall hernia  POST-OPERATIVE DIAGNOSIS:  recurrent incarcerated incisional abdominal wall hernia  PROCEDURE:  Procedure(s): LAPAROSCOPIC reduction & repair of incarcerated recurrent VENTRAL HERNIA  SURGEON:  Surgeon(s): Ardeth Sportsman, MD  ANESTHESIA:   local and general  EBL:  Total I/O In: 1000 [I.V.:1000] Out: 200 [Urine:200]  BLOOD ADMINISTERED:none  DRAINS: none   LOCAL MEDICATIONS USED:  BUPIVICAINE 50CC  SPECIMEN:  No Specimen  DISPOSITION OF SPECIMEN:  N/A  COUNTS:  YES  TOURNIQUET:  * No tourniquets in log *  DICTATION: .Other Dictation: Dictation Number [id number]  PLAN OF CARE: Admit for overnight observation  PATIENT DISPOSITION:  PACU - hemodynamically stable.   Delay start of Pharmacological VTE agent (>24hrs) due to surgical blood loss or risk of bleeding: NO

## 2011-08-13 NOTE — Transfer of Care (Signed)
Immediate Anesthesia Transfer of Care Note  Patient: Katherine Sanchez  Procedure(s) Performed:  LAPAROSCOPIC VENTRAL HERNIA - Laparoscopic Ventral Hernia Repair with Mesh  Patient Location: PACU  Anesthesia Type: General  Level of Consciousness: awake, oriented and patient cooperative  Airway & Oxygen Therapy: Patient Spontanous Breathing and Patient connected to face mask oxygen  Post-op Assessment: Report given to PACU RN, Post -op Vital signs reviewed and stable and Patient moving all extremities  Post vital signs: Reviewed and stable  Complications: No apparent anesthesia complications

## 2011-08-13 NOTE — Anesthesia Preprocedure Evaluation (Addendum)
Anesthesia Evaluation  Patient identified by MRN, date of birth, ID band Patient awake    Reviewed: Allergy & Precautions, H&P , NPO status , Patient's Chart, lab work & pertinent test results, reviewed documented beta blocker date and time   Airway Mallampati: II TM Distance: >3 FB Neck ROM: full    Dental No notable dental hx. (+) Teeth Intact and Dental Advisory Given   Pulmonary neg pulmonary ROS, sleep apnea and Continuous Positive Airway Pressure Ventilation , pneumonia ,  clear to auscultation  Pulmonary exam normal       Cardiovascular Exercise Tolerance: Good hypertension, On Medications neg cardio ROS + Valvular Problems/Murmurs MVP regular Normal    Neuro/Psych Negative Neurological ROS  Negative Psych ROS   GI/Hepatic negative GI ROS, Neg liver ROS,   Endo/Other  Negative Endocrine ROSHypothyroidism   Renal/GU negative Renal ROS  Genitourinary negative   Musculoskeletal   Abdominal   Peds  Hematology negative hematology ROS (+)   Anesthesia Other Findings   Reproductive/Obstetrics negative OB ROS                          Anesthesia Physical Anesthesia Plan  ASA: III  Anesthesia Plan: General   Post-op Pain Management:    Induction: Intravenous  Airway Management Planned: Oral ETT  Additional Equipment:   Intra-op Plan:   Post-operative Plan: Extubation in OR  Informed Consent: I have reviewed the patients History and Physical, chart, labs and discussed the procedure including the risks, benefits and alternatives for the proposed anesthesia with the patient or authorized representative who has indicated his/her understanding and acceptance.   Dental Advisory Given  Plan Discussed with: CRNA and Surgeon  Anesthesia Plan Comments:         Anesthesia Quick Evaluation

## 2011-08-13 NOTE — Anesthesia Postprocedure Evaluation (Signed)
  Anesthesia Post-op Note  Patient: Katherine Sanchez  Procedure(s) Performed:  LAPAROSCOPIC VENTRAL HERNIA - Laparoscopic Ventral Hernia Repair with Mesh  Patient Location: PACU  Anesthesia Type: General  Level of Consciousness: awake and alert   Airway and Oxygen Therapy: Patient Spontanous Breathing  Post-op Pain: mild  Post-op Assessment: Post-op Vital signs reviewed, Patient's Cardiovascular Status Stable, Respiratory Function Stable, Patent Airway and No signs of Nausea or vomiting  Post-op Vital Signs: stable  Complications: No apparent anesthesia complications

## 2011-08-13 NOTE — Preoperative (Signed)
Beta Blockers   Reason not to administer Beta Blockers:Not Applicable 

## 2011-08-13 NOTE — Op Note (Signed)
NAMEMARYA, Katherine Sanchez NO.:  [id number]  MEDICAL RECORD NO.:  [id number]  LOCATION:  [facility]                         FACILITY:  [facility]  PHYSICIAN:  Ardeth Sportsman, MD     DATE OF BIRTH:  22-Feb-1956  DATE OF PROCEDURE:  08/13/2011 DATE OF DISCHARGE:                              OPERATIVE REPORT   PRIMARY CARE PHYSICIAN:  Georgina Quint. Plotnikov, MD.  SURGEON:  Ardeth Sportsman, MD.  PREOPERATIVE DIAGNOSIS:  Recurrent periumbilical ventral wall hernia.  POSTOPERATIVE DIAGNOSIS:  Recurrent incarcerated periumbilical incisional ventral wall hernia.  PROCEDURE PERFORMED:  Laparoscopic reduction and repair of incarcerated ventral wall hernia repair with mesh.  ANESTHESIA: 1. General anesthesia. 2. Local anesthetic and a field block on all port sites. 3. Continuous bupivacaine pump in deep abdominal fascia.  ESTIMATED BLOOD LOSS:  Minimal.  COMPLICATIONS:  None apparent.  INDICATIONS:  Katherine Sanchez is a 55 year old morbidly obese female who had a periumbilical ventral hernia that was repaired with a mesh in 2004. She had required a cholecystectomy and re-repair of her hernia in 2010. She had to do a lot of lifting, take care of her mother, and developed a recurrent hernia.  She is morbidly obese.  The pathophysiology of herniation with its natural history, risks of incarceration and strangulation were discussed.  Options discussed. Recommendation made for diagnostic laparoscopy with underlay repair with mesh.  Risks, benefits, alternatives discussed.  All questions were answered and she agreed to proceed.  OPERATIVE FINDINGS:  She had a 9 x 12 cm region of Swiss-cheese hernias, periumbilical, primary laying in a transverse oval pattern.  It was incarcerated with omentum.  They were probably about 12 defects total, the largest one was about 4 x 5 cm.  DESCRIPTION OF PROCEDURE:  Informed consent was confirmed.  Patient underwent general anesthesia without any  difficulty.  She was in supine with arms tucked.  She had a Foley catheter in place.  Her abdomen and mons pubis were clipped, prepped and draped in a sterile fashion. Surgical time-out confirmed our plan.  I placed a #5 mm port in the left upper quadrant using optical entry technique with the patient in steep reverse Trendelenburg and left side up.  Entry was clean.  I induced carbon dioxide insufflation.  Camera inspection revealed no injury.  I could see a large swath of omentum incarcerated in the midline in the mid abdomen around the umbilicus. Under direct visualization, I placed a 10-mm port in the left flank and a 5-mm port in the left lower quadrant.  I used sharp dissection to free up the hernia sac and omentum out of the periumbilical hernia.  It was incarcerated in there, but eventually after freeing off the edges of the hernia, I could start reducing out the omentum.  Hemostasis was excellent.  I did trim off some excess sac.  I measured out the defect.  Given it is a larger region than anticipated, I used a larger mesh than anticipated.  I used a Parietex/Seprafilm dual-sided mesh.  I laid it in the abdomen and laid it primarily transversely since it was more of a transverse than a vertical defect.  I secured it to the  anterior abdominal wall using #1 Prolene transabdominal fascial wall stitches through the mesh x14 stitches around the edge.  I used a SecureStrap tacker x2 to help tack the central and lateral portions of the mesh.  This provided at least 5- 10 cm circumferential coverage around the whole defect.  Because of the large defect and her obesity and pain issues, I placed her on On-Q pain pump.  I did that using catheters in the preperitoneal and transversus abdominis layers.  I did inspection and ensured hemostasis.  I went ahead and closed the fascial defect of the 10-mm port using a 0 Vicryl stitch using laparoscopic suture passer under direct  visualization.  I evacuate carbon dioxide and removed the port.  I secured the fascial stitch on the 10-mm port site.  I closed the port sites using Monocryl with sterile dressings.  I closed the puncture sites with Steri-Strips. I put in the catheters and peeled away the sheath that are On-Q.  Patient is being extubated to go to the recovery room.  Given the size and her issues with other health issues, I anticipate she will need an overnight stay and will see how things go.     Ardeth Sportsman, MD     SCG/MEDQ  D:  08/13/2011  T:  08/13/2011  Job:  [id number]  cc:   Georgina Quint. Plotnikov, MD [address] [place] [address]

## 2011-08-13 NOTE — H&P (Signed)
HPI  Patient Care Team:  Sonda Primes, MD as PCP - General   This patient is a 55 y.o.female who presents today for surgical evaluation.   Reason for visit: Recurrent periumbilical swelling. Concern of recurrent hernia.  Patient is a pleasant obese female who's had a periumbilical ventral hernia. She had an open repair with mesh in 2004. She had a half cholecystectomy and primary re-repair in 2010. She had to take care of her ailing mother last year. This required a lot of lifting. She worries that a hernia came back after that point. She notes that she has pain and swelling with activity.  She has been trying to lose weight. She woke her dogs several times a day. They have adjusted to a low fiber discharge diet. She has been losing about the 10 pounds over the past month. However, while her activities better after her knee surgery she still limited secondary to this pain. She wishes to have consideration of surgery for repair. She has some friends whom are nurses that recommended me.  She walks regularly without difficulty. Daily bowel movements on a fiber regimen. No history of skin infections.   Past Medical History   Diagnosis  Date   .  Arthritis    .  Hypertension    .  Hyperlipidemia    .  Thyroid disease    .  Heart murmur    .  Abdominal distension    .  Constipation    .  Incisional hernia     Past Surgical History   Procedure  Date   .  Appendectomy  1982   .  Knee surgery  2006     left - arthroscopic   .  Cholecystectomy  2010     lap with open redo umbilical hernia repair. Dr. Zachery Dakins   .  Hernia repair  2004     umbilical open w mesh plug. Dr. Lurene Shadow    History    Social History   .  Marital Status:  Married     Spouse Name:  N/A     Number of Children:  N/A   .  Years of Education:  N/A    Occupational History   .  Not on file.    Social History Main Topics   .  Smoking status:  Former Smoker     Quit date:  07/07/1978   .  Smokeless tobacco:  Never  Used   .  Alcohol Use:  No   .  Drug Use:  No   .  Sexually Active:  Not on file    Other Topics  Concern   .  Not on file    Social History Narrative   .  No narrative on file    Family History   Problem  Relation  Age of Onset   .  Cancer  Mother       colon    .  Heart disease  Mother  42      congestive heart failure    .  Other  Father  11      car accident    .  Cancer  Maternal Grandmother       ovarian    Current outpatient prescriptions:levothyroxine (SYNTHROID, LEVOTHROID) 25 MCG tablet, Daily., Disp: , Rfl: ; meloxicam (MOBIC) 15 MG tablet, as needed., Disp: , Rfl: ; simvastatin (ZOCOR) 10 MG tablet, Take 10 mg by mouth at bedtime. , Disp: , Rfl: ; amLODipine-benazepril (  LOTREL) 10-20 MG per capsule, 1 capsule daily. , Disp: , Rfl:  No Known Allergies  Review of Systems  Constitutional: Negative for fever, chills, diaphoresis, appetite change and fatigue.  HENT: Negative for ear pain, sore throat, trouble swallowing, neck pain and ear discharge.  Eyes: Negative for photophobia, discharge and visual disturbance.  Respiratory: Negative for cough, choking, chest tightness and shortness of breath.  Cardiovascular: Negative for chest pain and palpitations.  Gastrointestinal: Positive for abdominal pain. Negative for nausea, vomiting, diarrhea, constipation, blood in stool, abdominal distention, anal bleeding and rectal pain.  Genitourinary: Negative for dysuria, frequency and difficulty urinating.  Musculoskeletal: Negative for myalgias and gait problem.  Skin: Negative for color change, pallor and rash.  Neurological: Negative for dizziness, speech difficulty, weakness and numbness.  Hematological: Negative for adenopathy.  Psychiatric/Behavioral: Negative for confusion and agitation. The patient is not nervous/anxious.    Objective:   Physical Exam  Constitutional: She is oriented to person, place, and time. She appears well-developed and well-nourished. No  distress.  HENT:  Head: Normocephalic.  Mouth/Throat: Oropharynx is clear and moist. No oropharyngeal exudate.  Eyes: Conjunctivae and EOM are normal. Pupils are equal, round, and reactive to light. No scleral icterus.  Neck: Normal range of motion. Neck supple. No tracheal deviation present.  Cardiovascular: Normal rate, regular rhythm and intact distal pulses.  Pulmonary/Chest: Effort normal and breath sounds normal. No respiratory distress. She exhibits no tenderness.  Abdominal: Soft. She exhibits mass. She exhibits no distension. There is tenderness. Hernia confirmed negative in the right inguinal area and confirmed negative in the left inguinal area.  6x6cm periumb mass - reduces while supine. +mild TTP  Morbidly obese w panniculis  Genitourinary: No vaginal discharge found.  Musculoskeletal: Normal range of motion. She exhibits no tenderness.  Lymphadenopathy:  She has no cervical adenopathy.  Right: No inguinal adenopathy present.  Left: No inguinal adenopathy present.  Neurological: She is alert and oriented to person, place, and time. No cranial nerve deficit. She exhibits normal muscle tone. Coordination normal.  Skin: Skin is warm and dry. No rash noted. She is not diaphoretic. No erythema.  Psychiatric: Her behavior is normal. Judgment and thought content normal.  Pleasant in NAD    Assessment:    Recurrent VWH   Plan:    She would benefit from repair. I think he would be good to do a laparoscopic approach to make sure that she does not have multiple small Swiss cheese hernias. I suspect given her obesity and size that she will be seen an overnight stay. I spent some time trying to console her about the death of her mother. I noted that she had to do what she needed take care of her mother. Hopefully, we can help take care of this problem. I agreed with her efforts to lose weight and have a healthier lifestyle.   The anatomy & physiology of the abdominal wall was  discussed. The pathophysiology of hernias was discussed. Natural history risks without surgery of enlargement, pain, incarceration & strangulation was discussed. Contributors to complications such as smoking, obesity, diabetes, prior surgery, etc were discussed. I feel the risks of no intervention will lead to serious problems that outweigh the operative risks; therefore, I recommended surgery to reduce and repair the hernia. I explained laparoscopic techniques with possible need for an open approach. I noted the probable use of mesh to patch and/or buttress hernia repair   Risks such as bleeding, infection, abscess, need for further treatment, heart attack,  death, and other risks were discussed. Goals of post-operative recovery were discussed as well. Possibility that this will not correct all symptoms was explained. I stressed the importance of low-impact activity, aggressive pain control, avoiding constipation, & not pushing through pain to minimize risk of post-operative chronic pain or injury. Possibility of reherniation was discussed. We will work to minimize complications. An educational handout further explaining the pathology & treatment options was given as well. Questions were answered. The patient expresses understanding & wishes to proceed with surgery.   I have re-reviewed the the patient's records, history, medications, and allergies.  I have re-examined the patient.  I again discussed intraoperative plans and goals of post-operative recovery.  The patient agrees to proceed.

## 2011-08-14 MED ORDER — BUPIVACAINE 0.25 % ON-Q PUMP DUAL CATH 300 ML: 300.0000 mL | INJECTION | Status: DC

## 2011-08-14 MED ORDER — OXYCODONE HCL 5 MG PO TABS: 5.0000 mg | ORAL_TABLET | ORAL | Status: AC | PRN

## 2011-08-14 NOTE — Discharge Summary (Signed)
Physician Discharge Summary  Patient ID: Katherine Sanchez MRN: [id number] DOB/AGE: [id number] y.o.  Admit date: 08/13/2011 Discharge date: 08/14/2011  Admission Diagnoses:  Discharge Diagnoses:  Principal Problem:  *Recurrent ventral incisional hernia, periumbilical Active Problems:  Essential hypertension, benign  OSTEOARTHRITIS   Discharged Condition: good  Hospital Course: Pt underwent surgery w/o problems  By the time of D/C, she was walking well, tolerating oral liquids/solids, voiding, +flatus, and had adequate pain control off IV medicines.  Therefore, I felt it was reasonable to D/C home  Consults: none  Significant Diagnostic Studies:   Treatments: surgery: LAp VWH repair w mesh  Discharge Exam: Blood pressure 113/70, pulse 60, temperature 98.5 F (36.9 C), temperature source Oral, resp. rate 18, height 5\' 2"  (1.575 m), weight 214 lb (97.07 kg), SpO2 96.00%.  General: Pt awake/alert/oriented x4 in no major acute distress Eyes: PERRL, normal EOM. Neuro: CN II-XII intact w/o focal sensory/motor deficits. Lymph: No head/neck/groin lymphadenopathy Psych:  No delerium/psychosis/paranoia HEENT: Normocephalic, Mucus membranes moist.  No thrush Neck: Supple, No tracheal deviation Chest: No pain w good excursion CV:  Pulses intact.  Regular rhythm Abdomen: soft, Appropriately tender/nondistended.  No incarcerated hernias. Ext:  SCDs BLE.  No mjr edema.  No cyanosis Skin: No petechiae / purpurae   Disposition:    Medication List  As of 08/14/2011  6:31 AM   START taking these medications         * oxyCODONE 5 MG immediate release tablet   Commonly known as: Oxy IR/ROXICODONE   Take 1-2 tablets (5-10 mg total) by mouth every 4 (four) hours as needed for pain.                 * Notice: This list has 2 medication(s) that are the same as other medications prescribed for you. Read the directions carefully, and ask your doctor or other care provider to  review them with you.       CONTINUE taking these medications         amLODipine-benazepril 10-20 MG per capsule   Commonly known as: LOTREL      cetirizine 10 MG tablet   Commonly known as: ZYRTEC      docusate sodium 100 MG capsule   Commonly known as: COLACE      ibuprofen 200 MG tablet   Commonly known as: ADVIL,MOTRIN      levothyroxine 25 MCG tablet   Commonly known as: SYNTHROID, LEVOTHROID      meloxicam 15 MG tablet   Commonly known as: MOBIC      simvastatin 20 MG tablet   Commonly known as: ZOCOR      tetrahydrozoline 0.05 % ophthalmic solution      Vitamin C Powd          Where to get your medications    These are the prescriptions that you need to pick up.   You may get these medications from any pharmacy.         oxyCODONE 5 MG immediate release tablet           Follow-up Information    Follow up with Tor Tsuda C., MD in 2 weeks.   Contact information:   [facility] [address] [place] [address] [phone]          Signed: Ardeth Sportsman. 08/14/2011, 6:31 AM

## 2011-08-14 NOTE — Progress Notes (Signed)
Katherine Sanchez 05-15-1956 [id number]  PCP: Sonda Primes, MD, MD  Outpatient Care Team: Patient Care Team: Sonda Primes, MD as PCP - General  Inpatient Treatment Team: Treatment Team: Attending Provider: Ardeth Sportsman, MD; Registered Nurse: Skipper Cliche, RN; Respiratory Therapist: Carney Harder, RRT; Registered Nurse: Romeo Rabon, RN; Respiratory Therapist: Fontaine No, RRT   LOS: 1 day   1 Day Post-Op  Procedure(s): LAPAROSCOPIC VENTRAL HERNIArepairs with mesh    Subjective:  No major events Sore - using mostly PO meds Walking in room OK  Objective:  Vital signs:  Temp:  [97.5 F (36.4 C)-98.4 F (36.9 C)] 97.8 F (36.6 C) (12/21 0100) Pulse Rate:  [61-87] 62  (12/21 0100) Resp:  [13-24] 20  (12/21 0100) BP: (102-137)/(59-76) 102/63 mmHg (12/21 0100) SpO2:  [92 %-100 %] 96 % (12/21 0100) Weight:  [214 lb (97.07 kg)] 214 lb (97.07 kg) (12/20 1200)    Intake/Output    from previous day: 12/20 0701 - 12/21 0700 In: 3363 [P.O.:960; I.V.:2403] Out: 2775 [Urine:2775]  this shift: Total I/O In: 480 [P.O.:480] Out: 1550 [Urine:1550]  Flatus: yes BM: No  Physical Exam:  General: Pt awake/alert/oriented x4 in no acute distress Eyes: PERRL, normal EOM.  Sclera clear.  No icterus Neuro: CN II-XII intact w/o focal sensory/motor deficits. Lymph: No head/neck/groin lymphadenopathy Psych:  No delerium/psychosis/paranoia HENT: Normocephalic, Mucus membranes moist.  No thrush Neck: Supple, No tracheal deviation Chest: Clear No chest wall pain w good excursion CV:  Pulses intact.  Regular rhythm Abdomen: Soft, Mildly tender/Nondistended.  No incarcerated hernias.  Abd Binder in place Ext:  SCDs BLE.  No mjr edema.  No cyanosis Skin: No petechiae / purpurae  Results:   Labs: Results for orders placed during the hospital encounter of 08/13/11 (from the past 48 hour(s))  CBC     Status: Normal   Collection Time   08/13/11   2:20 PM      Component Value Range Comment   WBC 10.2  4.0 - 10.5 (K/uL)    RBC 4.79  3.87 - 5.11 (MIL/uL)    Hemoglobin 13.7  12.0 - 15.0 (g/dL)    HCT 21.3  08.6 - 57.8 (%)    MCV 83.7  78.0 - 100.0 (fL)    MCH 28.6  26.0 - 34.0 (pg)    MCHC 34.2  30.0 - 36.0 (g/dL)    RDW 46.9  62.9 - 52.8 (%)    Platelets 228  150 - 400 (K/uL)   CREATININE, SERUM     Status: Normal   Collection Time   08/13/11  2:20 PM      Component Value Range Comment   Creatinine, Ser 0.53  0.50 - 1.10 (mg/dL)    GFR calc non Af Amer >90  >90 (mL/min)    GFR calc Af Amer >90  >90 (mL/min)     Imaging / Studies: No results found.  Antibiotics: Anti-infectives     Start     Dose/Rate Route Frequency Ordered Stop   08/13/11 0615   ceFAZolin (ANCEF) IVPB 2 g/50 mL premix  Status:  Discontinued        2 g 100 mL/hr over 30 Minutes Intravenous 60 min pre-op 08/13/11 4132 08/13/11 1205          Medications / Allergies: per chart  Assessment / Plan: Katherine Sanchez  55 y.o. female   1 Day Post-Op  Procedure(s): LAPAROSCOPIC VENTRAL HERNIA repairs with mesh  Problem List:  Principal  Problem:  *Recurrent ventral incisional hernia, periumbilical Active Problems:  Essential hypertension, benign  -BP controlled  OSTEOARTHRITIS -D/C IVF -adv diet -increase PO pain control & cont ice/heat/On-Q pump -VTE prophylaxis- SCDs, etc -mobilize as tolerated to help recovery  Lorenso Courier, M.D., F.A.C.S. Gastrointestinal and Minimally Invasive Surgery [facility] [address] [place], [place] [address] [phone] Main / Paging [phone] Voice Mail   08/14/2011

## 2011-08-19 ENCOUNTER — Encounter (HOSPITAL_COMMUNITY): Payer: Self-pay | Admitting: Surgery

## 2011-08-31 ENCOUNTER — Other Ambulatory Visit (INDEPENDENT_AMBULATORY_CARE_PROVIDER_SITE_OTHER): Payer: Self-pay

## 2011-08-31 DIAGNOSIS — G8918 Other acute postprocedural pain: Secondary | ICD-10-CM

## 2011-08-31 MED ORDER — HYDROCODONE-ACETAMINOPHEN 5-325 MG PO TABS: 1.0000 | ORAL_TABLET | Freq: Four times a day (QID) | ORAL | Status: AC | PRN

## 2011-08-31 NOTE — Telephone Encounter (Signed)
Patient called wanting pain medicine. Out of oxy and said she would take hydrocodone. Dr Michaell Cowing said ok to hydrocodone and said she could have it refilled until February. Called it into [facility] [address]

## 2011-09-07 ENCOUNTER — Encounter (INDEPENDENT_AMBULATORY_CARE_PROVIDER_SITE_OTHER): Payer: Self-pay | Admitting: Surgery

## 2011-09-07 ENCOUNTER — Ambulatory Visit (INDEPENDENT_AMBULATORY_CARE_PROVIDER_SITE_OTHER): Payer: Federal, State, Local not specified - PPO | Admitting: Surgery

## 2011-09-07 VITALS — BP 132/84 | HR 72 | Temp 98.6°F | Resp 18 | Ht 62.0 in | Wt 214.6 lb

## 2011-09-07 DIAGNOSIS — E669 Obesity, unspecified: Secondary | ICD-10-CM

## 2011-09-07 DIAGNOSIS — K432 Incisional hernia without obstruction or gangrene: Secondary | ICD-10-CM

## 2011-09-07 NOTE — Progress Notes (Signed)
Subjective:     Patient ID: Katherine Sanchez, female   DOB: Mar 07, 1956, 56 y.o.   MRN: [id number]  HPI  Katherine Sanchez  12-21-1955 [id number]  Patient Care Team: Sonda Primes, MD as PCP - General  This patient is a 56 y.o.female who presents today for surgical evaluation.   Diagnosis:  ventral herniae  Procedure: laparoscopic ventral hernia appear with mesh 08/13/2011  The patient comes in today feeling better. She's weaned herself off all narcotics. Occasionally taking ibuprofen. No nausea or vomiting. No fevers or chills. She had some crampy abdominal pain and is decreasing. Mild numbness. Mild pulling on the left side. She was using a binder the first few weeks. She is not felt the need for it.  She's one into work on losing weight. She suggested her diet. She wants to start doing low-impact exercise.  Patient Active Problem List  Diagnoses  . Essential hypertension, benign  . OTHER SPEC FORMS CHRONIC ISCHEMIC HEART DISEASE  . Recurrent ventral incisional hernia, periumbilical  . OSTEOARTHRITIS  . ABNORMAL GLUCOSE NEC  . Obesity (BMI 30-39.9)    Past Medical History  Diagnosis Date  . Hypertension   . Hyperlipidemia   . Thyroid disease   . Heart murmur   . Abdominal distension   . Constipation   . Incisional hernia   . Pneumonia     hx of 2006   . Sleep apnea     cpap x 10 yrs Dr Shelle Iron   . Hypothyroidism   . Arthritis     knees     Past Surgical History  Procedure Date  . Knee surgery 2006    left - arthroscopic  . Cholecystectomy 2010    lap with open redo umbilical hernia repair.  Dr. Zachery Dakins  . Cardiac catheterization     18 yrs ago negative   . Appendectomy 1982  . Hernia repair 2004    umbilical open w mesh plug. Dr. Lurene Shadow and 2010 Dr Zachery Dakins   . Ventral hernia repair 08/13/2011    Procedure: LAPAROSCOPIC VENTRAL HERNIA;  Surgeon: Ardeth Sportsman, MD;  Location: [facility];  Service: General;  Laterality: N/A;  Laparoscopic Ventral Hernia  Repair with Mesh    History   Social History  . Marital Status: Married    Spouse Name: N/A    Number of Children: N/A  . Years of Education: N/A   Occupational History  . Not on file.   Social History Main Topics  . Smoking status: Former Smoker    Quit date: 07/07/1978  . Smokeless tobacco: Never Used  . Alcohol Use: No  . Drug Use: No  . Sexually Active: Not on file   Other Topics Concern  . Not on file   Social History Narrative  . No narrative on file    Family History  Problem Relation Age of Onset  . Cancer Mother     colon  . Heart disease Mother 30    congestive heart failure  . Other Father 32    car accident  . Cancer Maternal Grandmother     ovarian    Current outpatient prescriptions:amLODipine-benazepril (LOTREL) 10-20 MG per capsule, Take 1 capsule by mouth at bedtime. , Disp: , Rfl: ;  Ascorbic Acid (VITAMIN C) POWD, Take 1,000 mg by mouth daily. , Disp: , Rfl: ;  Bupivacaine HCl (BUPIVACAINE 0.25 % ON-Q PUMP DUAL CATH 300 ML), 300 mLs by Other route continuous., Disp: 1 mL, Rfl: 0 cetirizine (ZYRTEC) 10  MG tablet, Take 10 mg by mouth daily as needed. Pt takes [employer] brand  Allergy med which is Aller-Tec , Disp: , Rfl: ;  docusate sodium (COLACE) 100 MG capsule, Take 100 mg by mouth 2 (two) times daily as needed.  , Disp: , Rfl: ;  HYDROcodone-acetaminophen (NORCO) 5-325 MG per tablet, Take 1 tablet by mouth every 6 (six) hours as needed for pain., Disp: 30 tablet, Rfl: 0 ibuprofen (ADVIL,MOTRIN) 200 MG tablet, Take 200 mg by mouth every 6 (six) hours as needed.  , Disp: , Rfl: ;  levothyroxine (SYNTHROID, LEVOTHROID) 25 MCG tablet, Take 25 mcg by mouth every morning. , Disp: , Rfl: ;  meloxicam (MOBIC) 15 MG tablet, Take 15 mg by mouth daily as needed. Pain , Disp: , Rfl: ;  simvastatin (ZOCOR) 20 MG tablet, Take 20 mg by mouth at bedtime. , Disp: , Rfl:  tetrahydrozoline 0.05 % ophthalmic solution, Place 1 drop into both eyes 2 (two) times daily as  needed. Dry eye  , Disp: , Rfl:   No Known Allergies  BP 132/84  Pulse 72  Temp(Src) 98.6 F (37 C) (Temporal)  Resp 18  Ht 5\' 2"  (1.575 m)  Wt 214 lb 9.6 oz (97.342 kg)  BMI 39.25 kg/m2     Review of Systems  Constitutional: Negative for fever, chills and diaphoresis.  HENT: Negative for ear pain, sore throat and trouble swallowing.   Eyes: Negative for photophobia and visual disturbance.  Respiratory: Negative for cough and choking.   Cardiovascular: Negative for chest pain and palpitations.  Gastrointestinal: Negative for nausea, vomiting, diarrhea, constipation, blood in stool, abdominal distention, anal bleeding and rectal pain.  Genitourinary: Negative for dysuria, frequency and difficulty urinating.  Musculoskeletal: Negative for myalgias and gait problem.  Skin: Negative for color change, pallor and rash.  Neurological: Negative for dizziness, speech difficulty, weakness and numbness.  Hematological: Negative for adenopathy.  Psychiatric/Behavioral: Negative for confusion and agitation. The patient is not nervous/anxious.        Objective:   Physical Exam  Constitutional: She is oriented to person, place, and time. She appears well-developed and well-nourished. No distress.  HENT:  Head: Normocephalic.  Mouth/Throat: Oropharynx is clear and moist. No oropharyngeal exudate.  Eyes: Conjunctivae and EOM are normal. Pupils are equal, round, and reactive to light. No scleral icterus.  Neck: Normal range of motion. No tracheal deviation present.  Cardiovascular: Normal rate and intact distal pulses.   Pulmonary/Chest: Effort normal. No respiratory distress. She exhibits no tenderness.  Abdominal: Soft. She exhibits no distension and no mass. There is no rebound and no guarding. Hernia confirmed negative in the right inguinal area and confirmed negative in the left inguinal area.       Morbidly obese.  Incisions clean with normal healing ridges.  Mild soreness L  paramedian.  No hernias  Genitourinary: No vaginal discharge found.  Musculoskeletal: Normal range of motion. She exhibits no tenderness.  Lymphadenopathy:       Right: No inguinal adenopathy present.       Left: No inguinal adenopathy present.  Neurological: She is alert and oriented to person, place, and time. No cranial nerve deficit. She exhibits normal muscle tone. Coordination normal.  Skin: Skin is warm and dry. No rash noted. She is not diaphoretic.  Psychiatric: She has a normal mood and affect. Her behavior is normal.       Assessment:     3 weeks s/p lap VWH repair in morbidly obese patient, improving  Plan:     Increase activity as tolerated.  Do not push through pain.  OK to start low-impact activity & gradually do more.  Advanced on diet as tolerated. Bowel regimen to avoid problems.  Return to clinic p.r.n. The patient expressed understanding and appreciation

## 2012-02-28 ENCOUNTER — Other Ambulatory Visit: Payer: Self-pay | Admitting: Physician Assistant

## 2012-05-03 ENCOUNTER — Other Ambulatory Visit: Payer: Self-pay | Admitting: Physician Assistant

## 2012-05-03 NOTE — Telephone Encounter (Signed)
Please pull paper chart.  

## 2012-05-04 NOTE — Telephone Encounter (Signed)
Chart pulled to PA pool at nurse station [phone]

## 2012-07-04 ENCOUNTER — Other Ambulatory Visit: Payer: Self-pay | Admitting: Physician Assistant

## 2012-07-27 ENCOUNTER — Ambulatory Visit (INDEPENDENT_AMBULATORY_CARE_PROVIDER_SITE_OTHER): Payer: Federal, State, Local not specified - PPO | Admitting: Physician Assistant

## 2012-07-27 ENCOUNTER — Encounter: Payer: Self-pay | Admitting: Physician Assistant

## 2012-07-27 VITALS — BP 138/90 | HR 61 | Temp 97.8°F | Resp 16 | Ht 62.0 in | Wt 208.0 lb

## 2012-07-27 DIAGNOSIS — Z23 Encounter for immunization: Secondary | ICD-10-CM

## 2012-07-27 DIAGNOSIS — E785 Hyperlipidemia, unspecified: Secondary | ICD-10-CM

## 2012-07-27 DIAGNOSIS — I10 Essential (primary) hypertension: Secondary | ICD-10-CM

## 2012-07-27 DIAGNOSIS — E039 Hypothyroidism, unspecified: Secondary | ICD-10-CM

## 2012-07-27 LAB — CBC WITH DIFFERENTIAL/PLATELET
Eosinophils Relative: 3 % (ref 0–5)
Lymphocytes Relative: 40 % (ref 12–46)
Lymphs Abs: 2.3 10*3/uL (ref 0.7–4.0)
MCV: 84.8 fL (ref 78.0–100.0)
Platelets: 271 10*3/uL (ref 150–400)
RBC: 4.95 MIL/uL (ref 3.87–5.11)
WBC: 5.8 10*3/uL (ref 4.0–10.5)

## 2012-07-27 MED ORDER — AMLODIPINE BESY-BENAZEPRIL HCL 10-20 MG PO CAPS: 1.0000 | ORAL_CAPSULE | Freq: Every day | ORAL | Status: DC

## 2012-07-27 MED ORDER — LEVOTHYROXINE SODIUM 25 MCG PO TABS: 25.0000 ug | ORAL_TABLET | Freq: Every day | ORAL | Status: DC

## 2012-07-27 MED ORDER — MELOXICAM 15 MG PO TABS: 15.0000 mg | ORAL_TABLET | Freq: Every day | ORAL | Status: DC | PRN

## 2012-07-28 LAB — COMPREHENSIVE METABOLIC PANEL
CO2: 24 mEq/L (ref 19–32)
Creat: 0.52 mg/dL (ref 0.50–1.10)
Glucose, Bld: 105 mg/dL — ABNORMAL HIGH (ref 70–99)
Sodium: 139 mEq/L (ref 135–145)
Total Bilirubin: 0.4 mg/dL (ref 0.3–1.2)
Total Protein: 6.8 g/dL (ref 6.0–8.3)

## 2012-07-28 LAB — LIPID PANEL
Cholesterol: 156 mg/dL (ref 0–200)
HDL: 45 mg/dL (ref >39)
Total CHOL/HDL Ratio: 3.5 Ratio
Triglycerides: 106 mg/dL (ref <150)

## 2012-07-29 NOTE — Progress Notes (Signed)
  Subjective:    Patient ID: Katherine Sanchez, female    DOB: 1956-01-30, 56 y.o.   MRN: [id number]  HPI  56 yr old CF presents for BP, thyroid, cholesterol check.  She actually stopped her simvistatin a few months ago bc she felt like it was causing her muscle aches when she would go for a walk.  She admits to overall poor diet.  She has been completely out of her meds for about 1 week.  She has been caring for her mother-in-law that is living with her and her husband after sustaining a hip fracture in October. Because of this, the patient states she has not had time to get her mammogram or colonoscopy done.  She is overdue for her physical. She denies any problems currently Review of Systems  All other systems reviewed and are negative.       Objective:   Physical Exam  Nursing note and vitals reviewed. Constitutional: She is oriented to person, place, and time. She appears well-developed and well-nourished.  HENT:  Head: Normocephalic and atraumatic.  Cardiovascular: Normal rate, regular rhythm and normal heart sounds.   Pulmonary/Chest: Effort normal and breath sounds normal.  Neurological: She is alert and oriented to person, place, and time.  Skin: Skin is warm and dry.  Psychiatric: She has a normal mood and affect. Her behavior is normal.       Assessment & Plan:  Hypertension-not controlled, but she is off her meds X 1 week.  She agrees to check her BP OOO and make sure it is back to normal within a few days. Hypercholesterolemia-she stopped meds.  Will check levels. Hypothyroidism-check levels today CPE in Feb/ March.  We once again had a lengthy discussion about her MMG and colonoscopy that she has now been putting off for years.  I discussed the dangers of this with her. She says she will get this up to date after the first of the year.

## 2012-10-12 ENCOUNTER — Encounter: Payer: Federal, State, Local not specified - PPO | Admitting: Physician Assistant

## 2012-10-12 ENCOUNTER — Ambulatory Visit (INDEPENDENT_AMBULATORY_CARE_PROVIDER_SITE_OTHER): Payer: Federal, State, Local not specified - PPO | Admitting: Physician Assistant

## 2012-10-12 ENCOUNTER — Encounter: Payer: Self-pay | Admitting: Physician Assistant

## 2012-10-12 VITALS — BP 130/72 | HR 74 | Temp 98.6°F | Resp 16 | Ht 62.0 in | Wt 210.0 lb

## 2012-10-12 DIAGNOSIS — R5381 Other malaise: Secondary | ICD-10-CM

## 2012-10-12 DIAGNOSIS — R748 Abnormal levels of other serum enzymes: Secondary | ICD-10-CM

## 2012-10-12 DIAGNOSIS — R7401 Elevation of levels of liver transaminase levels: Secondary | ICD-10-CM

## 2012-10-12 DIAGNOSIS — Z1211 Encounter for screening for malignant neoplasm of colon: Secondary | ICD-10-CM

## 2012-10-12 DIAGNOSIS — R7309 Other abnormal glucose: Secondary | ICD-10-CM

## 2012-10-12 DIAGNOSIS — R6882 Decreased libido: Secondary | ICD-10-CM

## 2012-10-12 DIAGNOSIS — Z0000 Encounter for general adult medical examination without abnormal findings: Secondary | ICD-10-CM

## 2012-10-12 DIAGNOSIS — L821 Other seborrheic keratosis: Secondary | ICD-10-CM

## 2012-10-12 LAB — HEPATIC FUNCTION PANEL
ALT: 28 U/L (ref 0–35)
AST: 24 U/L (ref 0–37)
Albumin: 4.2 g/dL (ref 3.5–5.2)
Alkaline Phosphatase: 92 U/L (ref 39–117)
Total Protein: 7.2 g/dL (ref 6.0–8.3)

## 2012-10-12 LAB — POCT URINALYSIS DIPSTICK
Blood, UA: NEGATIVE
Leukocytes, UA: NEGATIVE
Nitrite, UA: NEGATIVE
Protein, UA: NEGATIVE
pH, UA: 5.5

## 2012-10-12 MED ORDER — LEVOTHYROXINE SODIUM 25 MCG PO TABS: 25.0000 ug | ORAL_TABLET | Freq: Every day | ORAL | Status: DC

## 2012-10-12 MED ORDER — FLUTICASONE PROPIONATE 50 MCG/ACT NA SUSP: 2.0000 | Freq: Every day | NASAL | Status: DC

## 2012-10-12 MED ORDER — AMLODIPINE BESY-BENAZEPRIL HCL 10-20 MG PO CAPS: 1.0000 | ORAL_CAPSULE | Freq: Every day | ORAL | Status: DC

## 2012-10-12 MED ORDER — MELOXICAM 15 MG PO TABS: 15.0000 mg | ORAL_TABLET | Freq: Every day | ORAL | Status: DC | PRN

## 2012-10-12 NOTE — Patient Instructions (Addendum)
Schedule your mammogram;

## 2012-10-12 NOTE — Progress Notes (Signed)
  Subjective:    Patient ID: Katherine Sanchez, female    DOB: 02-05-1956, 57 y.o.   MRN: [id number]  HPI 57 yr old CF here for CPE.  She has some skin lesions that are changing and she wants to see a dermatologist.  She is also agreeing now that I can get her scheduled for a colonoscopy. She has been having some ear congestion and ear pain on and off.  Review of Systems  All other systems reviewed and are negative.       Objective:   Physical Exam  Nursing note and vitals reviewed. Constitutional: She is oriented to person, place, and time. She appears well-developed and well-nourished.  obese  HENT:  Head: Normocephalic and atraumatic.  Right Ear: External ear normal.  Left Ear: External ear normal.  Nose: Nose normal.  Mouth/Throat: No oropharyngeal exudate.  B TM congested w/o evidence of infection  Eyes: Conjunctivae and EOM are normal. Pupils are equal, round, and reactive to light.  Fundi benign  Neck: Normal range of motion. Neck supple. No JVD present. No tracheal deviation present. No thyromegaly present.  Cardiovascular: Normal rate, regular rhythm, normal heart sounds and intact distal pulses.  Exam reveals no gallop and no friction rub.   No murmur heard. Pulmonary/Chest: Effort normal and breath sounds normal. Right breast exhibits no inverted nipple, no mass, no nipple discharge, no skin change and no tenderness. Left breast exhibits no inverted nipple, no mass, no nipple discharge, no skin change and no tenderness. Breasts are symmetrical.  Abdominal: Soft. Bowel sounds are normal.  Limited by obesity  Genitourinary: Vagina normal and uterus normal.  Bimanual exam done. Adnexa not palpable  Musculoskeletal: Normal range of motion.  Lymphadenopathy:    She has no cervical adenopathy.  Neurological: She is alert and oriented to person, place, and time. No cranial nerve deficit.  Skin: Skin is warm and dry.  Multiple seborrheic keratosis appearing lesions present on  breasts, chest and back.  Psychiatric: She has a normal mood and affect. Her behavior is normal.   Results for orders placed in visit on 10/12/12  POCT URINALYSIS DIPSTICK      Result Value Range   Color, UA yellow     Clarity, UA clear     Glucose, UA 100     Bilirubin, UA neg     Ketones, UA neg     Spec Grav, UA 1.020     Blood, UA neg     pH, UA 5.5     Protein, UA neg     Urobilinogen, UA 0.2     Nitrite, UA neg     Leukocytes, UA Negative    GLUCOSE, POCT (MANUAL RESULT ENTRY)      Result Value Range   POC Glucose 114 (*) 70 - 99 mg/dl       Assessment & Plan:  CPE Seborrheic keratosis-refer to derm Needs colonoscopy screening-will refer to GI Patient will schedule her mammogram. Elevated glucose-she isn't fasting this morning.  She will work on this with exercise and dietary changes and we will recheck in 6 weeks. Hypertension-stable-will improve with weight loss and continue current meds Hypothyroid-checking Hyperlipidemia-stable without meds Elevated LFTs in December-recheck today

## 2012-10-13 ENCOUNTER — Encounter: Payer: Self-pay | Admitting: *Deleted

## 2012-11-10 ENCOUNTER — Other Ambulatory Visit: Payer: Self-pay

## 2012-11-10 MED ORDER — FLUTICASONE PROPIONATE 50 MCG/ACT NA SUSP: 2.0000 | Freq: Every day | NASAL | Status: DC

## 2012-12-14 ENCOUNTER — Other Ambulatory Visit: Payer: Self-pay | Admitting: Physician Assistant

## 2013-04-12 ENCOUNTER — Ambulatory Visit: Payer: Federal, State, Local not specified - PPO | Admitting: Physician Assistant

## 2013-06-03 ENCOUNTER — Other Ambulatory Visit: Payer: Self-pay | Admitting: Physician Assistant

## 2013-06-28 ENCOUNTER — Encounter: Payer: Self-pay | Admitting: Pulmonary Disease

## 2013-06-28 ENCOUNTER — Ambulatory Visit (INDEPENDENT_AMBULATORY_CARE_PROVIDER_SITE_OTHER): Payer: Federal, State, Local not specified - PPO | Admitting: Pulmonary Disease

## 2013-06-28 VITALS — BP 142/90 | HR 68 | Temp 97.8°F | Ht 62.0 in | Wt 217.2 lb

## 2013-06-28 DIAGNOSIS — G4733 Obstructive sleep apnea (adult) (pediatric): Secondary | ICD-10-CM | POA: Insufficient documentation

## 2013-06-28 NOTE — Assessment & Plan Note (Signed)
The patient has a history of mild obstructive sleep apnea, and has done well with CPAP over the years.  She now has had increasing daytime and nocturnal symptoms, and is having difficulties with her air flow from her device.  She has gained significant weight since the last visit.  At this point, I think she needs to get a new CPAP device, and we will optimize her pressure again on the automatic setting.  I have also encouraged her to work aggressively on weight loss.

## 2013-06-28 NOTE — Progress Notes (Signed)
Subjective:    Patient ID: Katherine Sanchez, female    DOB: [birth year], 57 y.o.   MRN: [id number]  HPI The patient is a 57 year old female who comes in today to reestablish for management of a truck of sleep apnea.  She was first diagnosed in 2003 with mild OSA, and wanted to work on weight loss for treatment.  She returned in 2007 with increasing symptoms, and the decision was made to start her on CPAP.  She has been on CPAP since that time, and has done very well with the device despite gaining 17 pounds since the last visit.  She uses nasal pillows with heated humidification, but thinks that she is having issues with mouth opening.  She is also describing air gulping.  Starting about one year ago, she began to have increased snoring, and decreased CPAP tolerance.  She would pull the mask off during the night, and thinks it may be secondary to excessive air flow.  She was also having abdominal discomfort due to air swallowing.  The patient states that her machine is not working properly, and I am unable to get downloaded data today.   Her Epworth score is abnormal at 11.   Sleep Questionnaire What time do you typically go to bed?( Between what hours) 1100-1130 1100-1130 at 1200 on 06/28/13 by Maisie Fus, CMA How long does it take you to fall asleep?  at 1200 on 06/28/13 by Maisie Fus, CMA How many times during the night do you wake up? 3 3 at 1200 on 06/28/13 by Maisie Fus, CMA What time do you get out of bed to start your day? 0700 0700 at 1200 on 06/28/13 by Maisie Fus, CMA Do you drive or operate heavy machinery in your occupation? No No at 1200 on 06/28/13 by Maisie Fus, CMA How much has your weight changed (up or down) over the past two years? (In pounds) 20 lb (9.072 kg)20 lb (9.072 kg) increased at 1200 on 06/28/13 by Maisie Fus, CMA Have you ever had a sleep study before? Yes Yes at 1200 on 06/28/13 by Maisie Fus, CMA If yes, location  of study? [facility] [place] [facility] at 1200 on 06/28/13 by Maisie Fus, CMA If yes, date of study? 2003 2003 at 1200 on 06/28/13 by Maisie Fus, CMA Do you currently use CPAP? Yes Yes at 1200 on 06/28/13 by Maisie Fus, CMA If so, what pressure? not sure fixed pressure-- highest RAMP not sure fixed pressure-- highest RAMP at 1200 on 06/28/13 by Maisie Fus, CMA Do you wear oxygen at any time? No    Review of Systems  Constitutional: Negative for fever and unexpected weight change.  HENT: Positive for congestion and ear pain. Negative for dental problem, nosebleeds, postnasal drip, rhinorrhea, sinus pressure, sneezing, sore throat and trouble swallowing.   Eyes: Negative for redness and itching.  Respiratory: Positive for shortness of breath. Negative for cough, chest tightness and wheezing.   Cardiovascular: Positive for palpitations ( heart murmur). Negative for leg swelling.  Gastrointestinal: Negative for nausea and vomiting.  Genitourinary: Negative for dysuria.  Musculoskeletal: Negative for joint swelling.  Skin: Negative for rash.  Neurological: Negative for headaches.  Hematological: Does not bruise/bleed easily.  Psychiatric/Behavioral: Negative for dysphoric mood. The patient is not nervous/anxious.        Objective:   Physical Exam Constitutional:  Obese female, no acute distress  HENT:  Nares patent without discharge  Oropharynx without exudate, palate and uvula are moderately elongated.  Eyes:  Perrla, eomi, no scleral icterus  Neck:  No JVD, no TMG  Cardiovascular:  Normal rate, regular rhythm, no rubs or gallops.  2/6 sem.         Intact distal pulses  Pulmonary :  Normal breath sounds, no stridor or respiratory distress   No rales, rhonchi, or wheezing  Abdominal:  Soft, nondistended, bowel sounds present.  No tenderness noted.   Musculoskeletal:  mild lower extremity edema noted.  Lymph Nodes:  No cervical lymphadenopathy  noted  Skin:  No cyanosis noted  Neurologic:  Alert, appropriate, moves all 4 extremities without obvious deficit.         Assessment & Plan:

## 2013-06-28 NOTE — Patient Instructions (Signed)
Will get you a new cpap machine, and use the automatic mode to optimize your pressure.  Will call you with results once I receive your download, and you will have choice of staying on auto setting or going to optimal fixed pressure. Work on weight loss followup with me in 6mos, but please call if having tolerance issues.

## 2013-06-30 ENCOUNTER — Encounter: Payer: Self-pay | Admitting: Internal Medicine

## 2013-10-02 ENCOUNTER — Other Ambulatory Visit: Payer: Self-pay | Admitting: Physician Assistant

## 2013-11-25 ENCOUNTER — Other Ambulatory Visit: Payer: Self-pay | Admitting: Physician Assistant

## 2013-12-04 ENCOUNTER — Other Ambulatory Visit: Payer: Self-pay | Admitting: Physician Assistant

## 2013-12-27 ENCOUNTER — Ambulatory Visit (INDEPENDENT_AMBULATORY_CARE_PROVIDER_SITE_OTHER): Payer: Federal, State, Local not specified - PPO | Admitting: Pulmonary Disease

## 2013-12-27 ENCOUNTER — Encounter (INDEPENDENT_AMBULATORY_CARE_PROVIDER_SITE_OTHER): Payer: Self-pay

## 2013-12-27 ENCOUNTER — Encounter: Payer: Self-pay | Admitting: Pulmonary Disease

## 2013-12-27 VITALS — BP 130/80 | HR 65 | Temp 97.7°F | Ht 62.0 in | Wt 217.0 lb

## 2013-12-27 DIAGNOSIS — G4733 Obstructive sleep apnea (adult) (pediatric): Secondary | ICD-10-CM

## 2013-12-27 NOTE — Patient Instructions (Signed)
Will limit your cpap pressure to 5-15. Let us know if you continue to have issues with mask leak or if your pressure is not adequatel Work on weight loss. followup with me again in 9mos.

## 2013-12-27 NOTE — Progress Notes (Signed)
   Subjective:    Patient ID: Katherine Sanchez, female    DOB: [birth year], 58 y.o.   MRN: [id number]  HPI Patient comes in today for followup of her obstructive sleep apnea. She is wearing CPAP compliantly by her download, and has excellent control of her obstructive events. She is having some mask leak issues, but this occurs when her machine cramps to higher pressures. A higher pressures can also awaken her from sleep. Overall, she is quite happy with the device, and has seen significant improvement in her sleep and daytime alertness.   Review of Systems  Constitutional: Negative for fever and unexpected weight change.  HENT: Positive for sneezing. Negative for congestion, dental problem, ear pain, nosebleeds, postnasal drip, rhinorrhea, sinus pressure, sore throat and trouble swallowing.   Eyes: Negative for redness and itching.  Respiratory: Negative for cough, chest tightness, shortness of breath and wheezing.   Cardiovascular: Negative for palpitations and leg swelling.  Gastrointestinal: Negative for nausea and vomiting.  Genitourinary: Negative for dysuria.  Musculoskeletal: Negative for joint swelling.  Skin: Negative for rash.  Neurological: Negative for headaches.  Hematological: Does not bruise/bleed easily.  Psychiatric/Behavioral: Negative for dysphoric mood. The patient is not nervous/anxious.        Objective:   Physical Exam Overweight female in no acute distress Nose without purulence or discharge noted No skin breakdown or pressure necrosis from the CPAP mask Neck without lymphadenopathy or thyromegaly Lower extremities without edema, no cyanosis Alert and oriented, does not appear to be sleepy, moves all 4 extremities.       Assessment & Plan:

## 2013-12-27 NOTE — Assessment & Plan Note (Signed)
The patient has done very well with CPAP by her download, and has excellent control of her obstructive events on the automatic setting. However, she is having a few mask leaks at the higher pressures, and this can also awaken her from sleep. It appears that she can get by with a lower pressure, and we'll therefore limit her setting to 5-15 cm. She is to let us know if she continues to have mask leaks or is not sleeping as well as she feels she should.  I also encouraged her to work aggressively on weight loss.

## 2014-01-13 ENCOUNTER — Other Ambulatory Visit: Payer: Self-pay | Admitting: Physician Assistant

## 2014-02-09 ENCOUNTER — Telehealth: Payer: Self-pay

## 2014-02-09 MED ORDER — AMLODIPINE BESY-BENAZEPRIL HCL 10-20 MG PO CAPS: ORAL_CAPSULE | ORAL | Status: DC

## 2014-02-09 NOTE — Telephone Encounter (Signed)
Sent in 30 day supply- pt advised.

## 2014-02-09 NOTE — Telephone Encounter (Signed)
Patient is needing to know if she could get enough BP medication to last her until she can come back in and see Dr Marin Comment on 02/16/14.  Patient states that she has been out of her BP Medicine for at least two weeks.   [facility]#: [phone]   Pharmacy: [facility] on [address]

## 2014-02-16 ENCOUNTER — Ambulatory Visit (INDEPENDENT_AMBULATORY_CARE_PROVIDER_SITE_OTHER): Payer: Federal, State, Local not specified - PPO | Admitting: Family Medicine

## 2014-02-16 ENCOUNTER — Encounter: Payer: Self-pay | Admitting: Family Medicine

## 2014-02-16 VITALS — BP 110/78 | HR 62 | Temp 98.0°F | Resp 16 | Ht 62.0 in | Wt 220.4 lb

## 2014-02-16 DIAGNOSIS — Z9989 Dependence on other enabling machines and devices: Secondary | ICD-10-CM

## 2014-02-16 DIAGNOSIS — G4733 Obstructive sleep apnea (adult) (pediatric): Secondary | ICD-10-CM

## 2014-02-16 DIAGNOSIS — M1990 Unspecified osteoarthritis, unspecified site: Secondary | ICD-10-CM

## 2014-02-16 DIAGNOSIS — M129 Arthropathy, unspecified: Secondary | ICD-10-CM

## 2014-02-16 DIAGNOSIS — J309 Allergic rhinitis, unspecified: Secondary | ICD-10-CM

## 2014-02-16 DIAGNOSIS — J302 Other seasonal allergic rhinitis: Secondary | ICD-10-CM

## 2014-02-16 DIAGNOSIS — E039 Hypothyroidism, unspecified: Secondary | ICD-10-CM

## 2014-02-16 DIAGNOSIS — I10 Essential (primary) hypertension: Secondary | ICD-10-CM

## 2014-02-16 LAB — COMPLETE METABOLIC PANEL WITHOUT GFR
ALT: 30 U/L (ref 0–35)
Creat: 0.58 mg/dL (ref 0.50–1.10)
Sodium: 137 meq/L (ref 135–145)
Total Bilirubin: 0.5 mg/dL (ref 0.2–1.2)
Total Protein: 7.7 g/dL (ref 6.0–8.3)

## 2014-02-16 LAB — COMPLETE METABOLIC PANEL WITH GFR
AST: 26 U/L (ref 0–37)
Albumin: 4.5 g/dL (ref 3.5–5.2)
Alkaline Phosphatase: 99 U/L (ref 39–117)
BUN: 11 mg/dL (ref 6–23)
CO2: 27 mEq/L (ref 19–32)
Calcium: 9.5 mg/dL (ref 8.4–10.5)
Chloride: 101 mEq/L (ref 96–112)
GFR, Est African American: >89 mL/min
GFR, Est Non African American: >89 mL/min
Glucose, Bld: 108 mg/dL — ABNORMAL HIGH (ref 70–99)
Potassium: 4.7 mEq/L (ref 3.5–5.3)

## 2014-02-16 LAB — MICROALBUMIN, URINE: Microalb, Ur: 3.89 mg/dL — ABNORMAL HIGH (ref 0.00–1.89)

## 2014-02-16 LAB — TSH: TSH: 4.096 u[IU]/mL (ref 0.350–4.500)

## 2014-02-16 MED ORDER — LEVOTHYROXINE SODIUM 25 MCG PO TABS: 25.0000 ug | ORAL_TABLET | Freq: Every day | ORAL | Status: DC

## 2014-02-16 MED ORDER — AMLODIPINE BESY-BENAZEPRIL HCL 10-20 MG PO CAPS: ORAL_CAPSULE | ORAL | Status: DC

## 2014-02-16 MED ORDER — MELOXICAM 15 MG PO TABS: 15.0000 mg | ORAL_TABLET | Freq: Every day | ORAL | Status: DC

## 2014-02-16 MED ORDER — FLUTICASONE PROPIONATE 50 MCG/ACT NA SUSP: 2.0000 | Freq: Every day | NASAL | Status: DC

## 2014-02-16 NOTE — Progress Notes (Signed)
Chief Complaint:  Chief Complaint  Patient presents with  . Medication Refill    BP med    HPI: Katherine Sanchez is a 58 y.o. female who is here for med refills HTN-doing well, taking meds without problems.  HYpothyroidism doing well She was out of meds for 1 week and was able to refill meds for only 15 days prior to coming in here, otherwise no other time laspses with medication complaince. She is doing well overall, no SEs, no CP, sob, palpitations   Past Medical History  Diagnosis Date  . Hypertension   . Hyperlipidemia   . Thyroid disease   . Heart murmur   . Abdominal distension   . Constipation   . Incisional hernia   . Pneumonia     hx of 2006   . Sleep apnea     cpap x 10 yrs Dr [name]   . Hypothyroidism   . Arthritis     knees    Past Surgical History  Procedure Laterality Date  . Knee surgery  2006    left - arthroscopic  . Cholecystectomy  2010    lap with open redo umbilical hernia repair.  Dr. [name]  . Cardiac catheterization      1999 and negative  . Appendectomy  1982  . Hernia repair  1950    umbilical open w mesh plug. Dr. [name] and 2010 Dr [name]   . Ventral hernia repair  08/13/2011    Procedure: LAPAROSCOPIC VENTRAL HERNIA;  Surgeon: Adin Hector, MD;  Location: [facility];  Service: General;  Laterality: N/A;  Laparoscopic Ventral Hernia Repair with Mesh   History   Social History  . Marital Status: Married    Spouse Name: N/A    Number of Children: N/A  . Years of Education: N/A   Occupational History  . 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)    Social History Main Topics  . Smoking status: Former Smoker -- 0.10 packs/day for 4 years    Types: Cigarettes    Quit date: 07/07/1978  . Smokeless tobacco: Never Used     Comment: occassional smoker with alcohol consumption  . Alcohol Use: No     Comment: quit 1979  . Drug Use: No  . Sexual Activity: Yes    Birth Control/ Protection: None     Comment: number of sex partners in  the last 52 months 1   Other Topics Concern  . None   Social History Narrative   Lives with husband and his mother is currently living with them after sustaining a fall in Oct. 2013.   Exercise walking 2 times daily   Family History  Problem Relation Age of Onset  . Cancer Mother     colon  . Heart disease Mother 77    congestive heart failure  . COPD Mother   . Other Father 51    car accident  . Cancer Maternal Grandmother     ovarian  . Asthma Son   . Allergies Son   . Hypertension Paternal Grandmother   . Cancer Paternal Grandfather     prostate   Allergies  Allergen Reactions  . Amoxil [Amoxicillin]     hives   Prior to Admission medications   Medication Sig Start Date End Date Taking? Authorizing Provider  amLODipine-benazepril (LOTREL) 10-20 MG per capsule TAKE 1 CAPSULE BY MOUTH DAILY. PATIENT NEEDS OFFICE VISIT FOR ADDITIONAL REFILLS 02/09/14  Yes Mancel Bale, PA-C  Ascorbic Acid (  VITAMIN C) POWD Take 1,000 mg by mouth daily.    Yes Historical Provider, MD  aspirin 81 MG tablet Take 81 mg by mouth daily.   Yes Historical Provider, MD  cetirizine (ZYRTEC) 10 MG tablet Take 10 mg by mouth daily as needed. Pt takes [employer] brand  Allergy med which is Aller-Tec    Yes Historical Provider, MD  docusate sodium (COLACE) 100 MG capsule Take 100 mg by mouth 2 (two) times daily as needed.     Yes Historical Provider, MD  fluticasone (FLONASE) 50 MCG/ACT nasal spray Place 2 sprays into the nose daily. 11/10/12  Yes Dionne Bucy McClung, PA-C  ibuprofen (ADVIL,MOTRIN) 200 MG tablet Take 200 mg by mouth every 6 (six) hours as needed.     Yes Historical Provider, MD  levothyroxine (SYNTHROID, LEVOTHROID) 25 MCG tablet TAKE 1 TABLET (25 MCG TOTAL) BY MOUTH DAILY. PATIENT NEEDS OFFICE VISIT FOR ADDITIONAL REFILLS   Yes Mancel Bale, PA-C  meloxicam (MOBIC) 15 MG tablet Take 1 tablet (15 mg total) by mouth daily. PATIENT NEEDS OFFICE VISIT FOR ADDITIONAL REFILLS   Yes Theda Sers,  PA-C     ROS: The patient denies fevers, chills, night sweats, unintentional weight loss, chest pain, palpitations, wheezing, dyspnea on exertion, nausea, vomiting, abdominal pain, dysuria, hematuria, melena, numbness, weakness, or tingling.   All other systems have been reviewed and were otherwise negative with the exception of those mentioned in the HPI and as above.    PHYSICAL EXAM: Filed Vitals:   02/16/14 1012  BP: 110/78  Pulse: 62  Temp: 98 F (36.7 C)  Resp: 16   Filed Vitals:   02/16/14 1012  Height: 5\' 2"  (1.575 m)  Weight: 220 lb 6.4 oz (99.973 kg)   Body mass index is 40.3 kg/(m^2).  General: Alert, no acute distress, obese female HEENT:  Normocephalic, atraumatic, oropharynx patent. EOMI, PERRLA, no thyroid megaly Cardiovascular:  Regular rate and rhythm, no rubs murmurs or gallops.  No Carotid bruits, radial pulse intact. No pedal edema.  Respiratory: Clear to auscultation bilaterally.  No wheezes, rales, or rhonchi.  No cyanosis, no use of accessory musculature GI: No organomegaly, abdomen is soft and non-tender, positive bowel sounds.  No masses. Skin: No rashes. Neurologic: Facial musculature symmetric. Psychiatric: Patient is appropriate throughout our interaction. Lymphatic: No cervical lymphadenopathy Musculoskeletal: Gait intact.   LABS: Results for orders placed in visit on 10/12/12  HEPATIC FUNCTION PANEL      Result Value Ref Range   Total Bilirubin 0.4  0.3 - 1.2 mg/dL   Bilirubin, Direct 0.1  0.0 - 0.3 mg/dL   Indirect Bilirubin 0.3  0.0 - 0.9 mg/dL   Alkaline Phosphatase 92  39 - 117 U/L   AST 24  0 - 37 U/L   ALT 28  0 - 35 U/L   Total Protein 7.2  6.0 - 8.3 g/dL   Albumin 4.2  3.5 - 5.2 g/dL  TSH      Result Value Ref Range   TSH 3.924  0.350 - 4.500 uIU/mL  POCT URINALYSIS DIPSTICK      Result Value Ref Range   Color, UA yellow     Clarity, UA clear     Glucose, UA 100     Bilirubin, UA neg     Ketones, UA neg     Spec Grav, UA  1.020     Blood, UA neg     pH, UA 5.5     Protein, UA  neg     Urobilinogen, UA 0.2     Nitrite, UA neg     Leukocytes, UA Negative    GLUCOSE, POCT (MANUAL RESULT ENTRY)      Result Value Ref Range   POC Glucose 114 (*) 70 - 99 mg/dl     EKG/XRAY:   Primary read interpreted by Dr. Marin Comment at [facility].   ASSESSMENT/PLAN: Encounter Diagnoses  Name Primary?  Marland Kitchen Unspecified hypothyroidism Yes  . Essential hypertension   . OSA on CPAP    58 y.o female with HTN, OSA on CPAP and also hypothyprid Doing well overall, BP is WNL and is similar here to what she meausres at home Refilled meds for 1 year, will return for annual visit F/u as directed   Gross sideeffects, risk and benefits, and alternatives of medications d/w patient. Patient is aware that all medications have potential sideeffects and we are unable to predict every sideeffect or drug-drug interaction that may occur.  Katherine Sanchez, [place], DO 02/16/2014 10:38 AM

## 2014-02-28 ENCOUNTER — Encounter: Payer: Self-pay | Admitting: Family Medicine

## 2014-03-09 ENCOUNTER — Encounter: Payer: Federal, State, Local not specified - PPO | Admitting: Family Medicine

## 2014-04-13 ENCOUNTER — Encounter: Payer: Self-pay | Admitting: Family Medicine

## 2014-04-13 ENCOUNTER — Ambulatory Visit (INDEPENDENT_AMBULATORY_CARE_PROVIDER_SITE_OTHER): Payer: Federal, State, Local not specified - PPO | Admitting: Family Medicine

## 2014-04-13 VITALS — BP 130/82 | HR 68 | Temp 97.9°F | Resp 16 | Ht 62.0 in | Wt 222.0 lb

## 2014-04-13 DIAGNOSIS — Z130 Encounter for screening for diseases of the blood and blood-forming organs and certain disorders involving the immune mechanism: Secondary | ICD-10-CM

## 2014-04-13 DIAGNOSIS — Z124 Encounter for screening for malignant neoplasm of cervix: Secondary | ICD-10-CM

## 2014-04-13 DIAGNOSIS — Z1239 Encounter for other screening for malignant neoplasm of breast: Secondary | ICD-10-CM

## 2014-04-13 DIAGNOSIS — E039 Hypothyroidism, unspecified: Secondary | ICD-10-CM

## 2014-04-13 DIAGNOSIS — R635 Abnormal weight gain: Secondary | ICD-10-CM

## 2014-04-13 DIAGNOSIS — Z13220 Encounter for screening for lipoid disorders: Secondary | ICD-10-CM

## 2014-04-13 DIAGNOSIS — Z131 Encounter for screening for diabetes mellitus: Secondary | ICD-10-CM

## 2014-04-13 DIAGNOSIS — Z1211 Encounter for screening for malignant neoplasm of colon: Secondary | ICD-10-CM

## 2014-04-13 DIAGNOSIS — I10 Essential (primary) hypertension: Secondary | ICD-10-CM

## 2014-04-13 DIAGNOSIS — R21 Rash and other nonspecific skin eruption: Secondary | ICD-10-CM

## 2014-04-13 DIAGNOSIS — Z0000 Encounter for general adult medical examination without abnormal findings: Secondary | ICD-10-CM

## 2014-04-13 DIAGNOSIS — E669 Obesity, unspecified: Secondary | ICD-10-CM

## 2014-04-13 LAB — CBC
HCT: 43.1 % (ref 36.0–46.0)
Hemoglobin: 14.9 g/dL (ref 12.0–15.0)
MCH: 28.9 pg (ref 26.0–34.0)
MCHC: 34.6 g/dL (ref 30.0–36.0)
MCV: 83.7 fL (ref 78.0–100.0)
Platelets: 254 10*3/uL (ref 150–400)
RBC: 5.15 MIL/uL — ABNORMAL HIGH (ref 3.87–5.11)
RDW: 13.9 % (ref 11.5–15.5)
WBC: 6.3 10*3/uL (ref 4.0–10.5)

## 2014-04-13 LAB — POCT GLYCOSYLATED HEMOGLOBIN (HGB A1C): Hemoglobin A1C: 6.7

## 2014-04-13 MED ORDER — NYSTATIN 100000 UNIT/GM EX CREA: 1.0000 "application " | TOPICAL_CREAM | Freq: Two times a day (BID) | CUTANEOUS | Status: DC

## 2014-04-13 MED ORDER — NYSTATIN 100000 UNIT/GM EX POWD: CUTANEOUS | Status: DC

## 2014-04-13 NOTE — Progress Notes (Signed)
 Chief Complaint:  Chief Complaint  Patient presents with  . Annual Exam  . Rash    on inner left thigh  vagina is a little dryer    HPI: Katherine Sanchez is a 58 y.o. female who is here for  Annual exam with pap. She has vaginal dryness or itching around her vagina and under her breast She has HTN but that is doing ok, she wants her thyroid checked since she has had weight gain She ahs been taking care of her husband and parents sicne they have been sick, she ahs not had time for herself She is not UTD On mammogram, seh is not UTD on colonscopy ( never had one--will need to check with insurance company to see where they want her to go) She had no prior abnormal paps, last one was over 3 years ago  Past Medical History  Diagnosis Date  . Hypertension   . Hyperlipidemia   . Thyroid disease   . Heart murmur   . Abdominal distension   . Constipation   . Incisional hernia   . Pneumonia     hx of 2006   . Sleep apnea     cpap x 10 yrs Dr [name]   . Hypothyroidism   . Arthritis     knees   . GERD (gastroesophageal reflux disease)    Past Surgical History  Procedure Laterality Date  . Knee surgery  2006    left - arthroscopic  . Cholecystectomy  2010    lap with open redo umbilical hernia repair.  Dr. [name]  . Cardiac catheterization      1999 and negative  . Appendectomy  1982  . Hernia repair  [id number]    umbilical open w mesh plug. Dr. [name] and 2010 Dr [name]   . Ventral hernia repair  08/13/2011    Procedure: LAPAROSCOPIC VENTRAL HERNIA;  Surgeon: Adin Hector, MD;  Location: [facility];  Service: General;  Laterality: N/A;  Laparoscopic Ventral Hernia Repair with Mesh   History   Social History  . Marital Status: Married    Spouse Name: N/A    Number of Children: N/A  . Years of Education: N/A   Occupational History  . 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)    Social History Main Topics  . Smoking status: Former Smoker -- 0.10 packs/day for 4 years   Types: Cigarettes    Quit date: 07/07/1978  . Smokeless tobacco: Never Used     Comment: occassional smoker with alcohol consumption  . Alcohol Use: No     Comment: quit 1979  . Drug Use: No  . Sexual Activity: Yes    Birth Control/ Protection: None     Comment: number of sex partners in the last 19 months 1   Other Topics Concern  . None   Social History Narrative   Lives with husband and his mother is currently living with them after sustaining a fall in Oct. 2013.   Exercise walking 2 times daily   Family History  Problem Relation Age of Onset  . Cancer Mother     colon  . Heart disease Mother 58    congestive heart failure  . COPD Mother   . Other Father 46    car accident  . Cancer Maternal Grandmother     ovarian  . Asthma Son   . Allergies Son   . Hypertension Paternal Grandmother   . Cancer Paternal Grandfather  prostate   Allergies  Allergen Reactions  . Amoxil [Amoxicillin]     hives   Prior to Admission medications   Medication Sig Start Date End Date Taking? Authorizing Provider  amLODipine-benazepril (LOTREL) 10-20 MG per capsule Take 1 tab PO daily 02/16/14  Yes  P , DO  fluticasone (FLONASE) 50 MCG/ACT nasal spray Place 2 sprays into both nostrils daily. 02/16/14  Yes  P , DO  levothyroxine (SYNTHROID, LEVOTHROID) 25 MCG tablet Take 1 tablet (25 mcg total) by mouth daily before breakfast. 02/16/14  Yes  P , DO  meloxicam (MOBIC) 15 MG tablet Take 1 tablet (15 mg total) by mouth daily. Do not take with other NSAIDs, take with food. 02/16/14  Yes  P , DO  Ascorbic Acid (VITAMIN C) POWD Take 1,000 mg by mouth daily.     Historical Provider, MD  aspirin 81 MG tablet Take 81 mg by mouth daily.    Historical Provider, MD  cetirizine (ZYRTEC) 10 MG tablet Take 10 mg by mouth daily as needed. Pt takes [employer] brand  Allergy med which is Aller-Tec     Historical Provider, MD  docusate sodium (COLACE) 100 MG capsule Take 100 mg by mouth 2  (two) times daily as needed.      Historical Provider, MD  ibuprofen (ADVIL,MOTRIN) 200 MG tablet Take 200 mg by mouth every 6 (six) hours as needed.      Historical Provider, MD     ROS: The patient denies fevers, chills, night sweats, unintentional weight loss, chest pain, palpitations, wheezing, dyspnea on exertion, nausea, vomiting, abdominal pain, dysuria, hematuria, melena, numbness, weakness, or tingling.   All other systems have been reviewed and were otherwise negative with the exception of those mentioned in the HPI and as above.    PHYSICAL EXAM: Filed Vitals:   04/13/14 1130  BP: 130/82  Pulse: 68  Temp: 97.9 F (36.6 C)  Resp: 16   Filed Vitals:   04/13/14 1130  Height: 5\' 2"  (1.575 m)  Weight: 222 lb (100.699 kg)   Body mass index is 40.59 kg/(m^2).  General: Alert, no acute distress HEENT:  Normocephalic, atraumatic, oropharynx patent. EOMI, PERRLA, fundo exam normal, tm normal Cardiovascular:  Regular rate and rhythm, no rubs murmurs or gallops.  No ,Carotid bruits, radial pulse intact. No pedal edema.  Respiratory: Clear to auscultation bilaterally.  No wheezes, rales, or rhonchi.  No cyanosis, no use of accessory musculature GI: No organomegaly, abdomen is soft and non-tender, positive bowel sounds.  No masses. Skin: + candida groin and underneath breast rashes. Neurologic: Facial musculature symmetric. Psychiatric: Patient is appropriate throughout our interaction. Lymphatic: No cervical lymphadenopathy Musculoskeletal: Gait intact. GU-normal Breast normal   LABS: Results for orders placed in visit on 02/16/14  COMPLETE METABOLIC PANEL WITH GFR      Result Value Ref Range   Sodium 137  135 - 145 mEq/L   Potassium 4.7  3.5 - 5.3 mEq/L   Chloride 101  96 - 112 mEq/L   CO2 27  19 - 32 mEq/L   Glucose, Bld 108 (*) 70 - 99 mg/dL   BUN 11  6 - 23 mg/dL   Creat 0.58  0.50 - 1.10 mg/dL   Total Bilirubin 0.5  0.2 - 1.2 mg/dL   Alkaline Phosphatase 99  39 -  117 U/L   AST 26  0 - 37 U/L   ALT 30  0 - 35 U/L   Total Protein 7.7  6.0 - 8.3  g/dL   Albumin 4.5  3.5 - 5.2 g/dL   Calcium 9.5  8.4 - 10.5 mg/dL   GFR, Est African American >89     GFR, Est Non African American >89    MICROALBUMIN, URINE      Result Value Ref Range   Microalb, Ur 3.89 (*) 0.00 - 1.89 mg/dL  TSH      Result Value Ref Range   TSH 4.096  0.350 - 4.500 uIU/mL     EKG/XRAY:   Primary read interpreted by Dr. Marin Comment at [facility].   ASSESSMENT/PLAN: Encounter Diagnoses  Name Primary?  . Special screening for malignant neoplasms, colon   . Annual physical exam Yes  . Screening for hyperlipidemia   . Screening for deficiency anemia   . Essential hypertension   . Rash and nonspecific skin eruption   . Obesity, unspecified   . Screening for cervical cancer   . Screening for breast cancer   . Hypothyroidism, unspecified hypothyroidism type   . Screening for diabetes mellitus   . Weight gain    58 y.o female with newly dx hyperglycemia/glucose intolerance/DM Will start on Metformin 500 mg daily Labs pending Return in 3 months  Gross sideeffects, risk and benefits, and alternatives of medications d/w patient. Patient is aware that all medications have potential sideeffects and we are unable to predict every sideeffect or drug-drug interaction that may occur.  , [place], DO 04/13/2014 1:27 PM  Spoke to patient about labs on 04/15/14

## 2014-04-14 ENCOUNTER — Encounter: Payer: Self-pay | Admitting: Family Medicine

## 2014-04-14 LAB — COMPLETE METABOLIC PANEL WITH GFR
ALT: 36 U/L — ABNORMAL HIGH (ref 0–35)
AST: 31 U/L (ref 0–37)
Albumin: 4.4 g/dL (ref 3.5–5.2)
Alkaline Phosphatase: 104 U/L (ref 39–117)
BUN: 12 mg/dL (ref 6–23)
GFR, Est Non African American: >89 mL/min
Glucose, Bld: 117 mg/dL — ABNORMAL HIGH (ref 70–99)
Potassium: 4.6 mEq/L (ref 3.5–5.3)
Sodium: 138 mEq/L (ref 135–145)
Total Bilirubin: 0.4 mg/dL (ref 0.2–1.2)
Total Protein: 7.5 g/dL (ref 6.0–8.3)

## 2014-04-14 LAB — TSH: TSH: 3.499 u[IU]/mL (ref 0.350–4.500)

## 2014-04-14 LAB — LIPID PANEL
Cholesterol: 191 mg/dL (ref 0–200)
HDL: 50 mg/dL (ref >39)
LDL Cholesterol: 115 mg/dL — ABNORMAL HIGH (ref 0–99)
Total CHOL/HDL Ratio: 3.8 Ratio
Triglycerides: 128 mg/dL (ref <150)
VLDL: 26 mg/dL (ref 0–40)

## 2014-04-14 LAB — COMPLETE METABOLIC PANEL WITHOUT GFR
CO2: 24 meq/L (ref 19–32)
Calcium: 9.2 mg/dL (ref 8.4–10.5)
Chloride: 102 meq/L (ref 96–112)
Creat: 0.55 mg/dL (ref 0.50–1.10)
GFR, Est African American: >89 mL/min

## 2014-04-16 LAB — PAP IG W/ RFLX HPV ASCU

## 2014-04-16 MED ORDER — METFORMIN HCL 500 MG PO TABS: 500.0000 mg | ORAL_TABLET | Freq: Every day | ORAL | Status: DC

## 2014-04-25 ENCOUNTER — Other Ambulatory Visit: Payer: Self-pay | Admitting: Family Medicine

## 2014-04-25 ENCOUNTER — Ambulatory Visit (HOSPITAL_COMMUNITY)
Admission: RE | Admit: 2014-04-25 | Discharge: 2014-04-25 | Disposition: A | Discharge status: Home / Self Care | Payer: Federal, State, Local not specified - PPO | Source: Ambulatory Visit | Attending: Family Medicine | Admitting: Family Medicine

## 2014-04-25 DIAGNOSIS — Z1239 Encounter for other screening for malignant neoplasm of breast: Secondary | ICD-10-CM

## 2014-04-25 DIAGNOSIS — Z1231 Encounter for screening mammogram for malignant neoplasm of breast: Secondary | ICD-10-CM | POA: Diagnosis present

## 2014-04-25 DIAGNOSIS — Z0000 Encounter for general adult medical examination without abnormal findings: Secondary | ICD-10-CM

## 2014-07-11 ENCOUNTER — Ambulatory Visit: Payer: Federal, State, Local not specified - PPO | Admitting: Pulmonary Disease

## 2014-08-04 ENCOUNTER — Other Ambulatory Visit: Payer: Self-pay | Admitting: Family Medicine

## 2014-08-22 ENCOUNTER — Ambulatory Visit: Payer: Federal, State, Local not specified - PPO | Admitting: Pulmonary Disease

## 2014-09-05 ENCOUNTER — Ambulatory Visit: Payer: Federal, State, Local not specified - PPO | Admitting: Pulmonary Disease

## 2014-09-08 ENCOUNTER — Other Ambulatory Visit: Payer: Self-pay | Admitting: Physician Assistant

## 2015-02-23 ENCOUNTER — Other Ambulatory Visit: Payer: Self-pay | Admitting: Family Medicine

## 2015-03-24 ENCOUNTER — Ambulatory Visit (INDEPENDENT_AMBULATORY_CARE_PROVIDER_SITE_OTHER): Payer: Federal, State, Local not specified - PPO | Admitting: Family Medicine

## 2015-03-24 ENCOUNTER — Telehealth: Payer: Self-pay

## 2015-03-24 VITALS — BP 100/64 | HR 73 | Temp 97.7°F | Resp 14 | Ht 62.25 in | Wt 220.4 lb

## 2015-03-24 DIAGNOSIS — M1990 Unspecified osteoarthritis, unspecified site: Secondary | ICD-10-CM | POA: Diagnosis not present

## 2015-03-24 DIAGNOSIS — I10 Essential (primary) hypertension: Secondary | ICD-10-CM | POA: Diagnosis not present

## 2015-03-24 DIAGNOSIS — E1165 Type 2 diabetes mellitus with hyperglycemia: Secondary | ICD-10-CM | POA: Diagnosis not present

## 2015-03-24 DIAGNOSIS — G4733 Obstructive sleep apnea (adult) (pediatric): Secondary | ICD-10-CM

## 2015-03-24 DIAGNOSIS — E119 Type 2 diabetes mellitus without complications: Secondary | ICD-10-CM

## 2015-03-24 DIAGNOSIS — E039 Hypothyroidism, unspecified: Secondary | ICD-10-CM

## 2015-03-24 DIAGNOSIS — Z5181 Encounter for therapeutic drug level monitoring: Secondary | ICD-10-CM

## 2015-03-24 DIAGNOSIS — E038 Other specified hypothyroidism: Secondary | ICD-10-CM | POA: Diagnosis not present

## 2015-03-24 DIAGNOSIS — R079 Chest pain, unspecified: Secondary | ICD-10-CM | POA: Diagnosis not present

## 2015-03-24 DIAGNOSIS — IMO0002 Reserved for concepts with insufficient information to code with codable children: Secondary | ICD-10-CM

## 2015-03-24 DIAGNOSIS — Z9989 Dependence on other enabling machines and devices: Secondary | ICD-10-CM

## 2015-03-24 LAB — POCT CBC
Granulocyte percent: 54.3 %G (ref 37–80)
HCT, POC: 46.3 % (ref 37.7–47.9)
Hemoglobin: 15.3 g/dL (ref 12.2–16.2)
Lymph, poc: 2.2 (ref 0.6–3.4)
MCH, POC: 28 pg (ref 27–31.2)
MCHC: 33.1 g/dL (ref 31.8–35.4)
MCV: 84.5 fL (ref 80–97)
MID (cbc): 0.4 (ref 0–0.9)
MPV: 7.6 fL (ref 0–99.8)
POC Granulocyte: 3.1 (ref 2–6.9)
POC LYMPH PERCENT: 38.1 %L (ref 10–50)
POC MID %: 7.6 %M (ref 0–12)
Platelet Count, POC: 260 10*3/uL (ref 142–424)
RBC: 5.48 M/uL (ref 4.04–5.48)
RDW, POC: 13.9 %
WBC: 5.8 10*3/uL (ref 4.6–10.2)

## 2015-03-24 LAB — LIPID PANEL
Cholesterol: 165 mg/dL (ref 125–200)
HDL: 47 mg/dL (ref >=46)
LDL Cholesterol: 93 mg/dL (ref <130)
Total CHOL/HDL Ratio: 3.5 Ratio (ref <=5.0)
Triglycerides: 126 mg/dL (ref <150)
VLDL: 25 mg/dL (ref <30)

## 2015-03-24 LAB — COMPLETE METABOLIC PANEL WITHOUT GFR
Alkaline Phosphatase: 105 U/L (ref 33–130)
BUN: 10 mg/dL (ref 7–25)
Calcium: 9.5 mg/dL (ref 8.6–10.4)
Chloride: 103 mmol/L (ref 98–110)
Creat: 0.6 mg/dL (ref 0.50–1.05)

## 2015-03-24 LAB — COMPLETE METABOLIC PANEL WITH GFR
ALT: 35 U/L — ABNORMAL HIGH (ref 6–29)
AST: 26 U/L (ref 10–35)
Albumin: 4.4 g/dL (ref 3.6–5.1)
CO2: 25 mmol/L (ref 20–31)
GFR, Est African American: >89 mL/min (ref >=60)
GFR, Est Non African American: >89 mL/min (ref >=60)
Glucose, Bld: 140 mg/dL — ABNORMAL HIGH (ref 65–99)
Potassium: 4.3 mmol/L (ref 3.5–5.3)
Sodium: 137 mmol/L (ref 135–146)
Total Bilirubin: 0.6 mg/dL (ref 0.2–1.2)
Total Protein: 7.6 g/dL (ref 6.1–8.1)

## 2015-03-24 LAB — GLUCOSE, POCT (MANUAL RESULT ENTRY): POC Glucose: 144 mg/dl — AB (ref 70–99)

## 2015-03-24 LAB — TROPONIN I: Troponin I: <0.01 ng/mL (ref <0.06)

## 2015-03-24 LAB — MICROALBUMIN, URINE: Microalb, Ur: 2.2 mg/dL — ABNORMAL HIGH (ref <2.0)

## 2015-03-24 LAB — TSH: TSH: 3.797 u[IU]/mL (ref 0.350–4.500)

## 2015-03-24 LAB — POCT GLYCOSYLATED HEMOGLOBIN (HGB A1C): Hemoglobin A1C: 7.9

## 2015-03-24 MED ORDER — MELOXICAM 15 MG PO TABS: 15.0000 mg | ORAL_TABLET | Freq: Every day | ORAL | Status: DC

## 2015-03-24 MED ORDER — METFORMIN HCL 1000 MG PO TABS: 1000.0000 mg | ORAL_TABLET | Freq: Two times a day (BID) | ORAL | Status: DC

## 2015-03-24 MED ORDER — BLOOD GLUCOSE MONITOR KIT: PACK | Status: AC

## 2015-03-24 MED ORDER — AMLODIPINE BESY-BENAZEPRIL HCL 10-20 MG PO CAPS: 1.0000 | ORAL_CAPSULE | Freq: Every day | ORAL | Status: DC

## 2015-03-24 MED ORDER — FLUTICASONE PROPIONATE 50 MCG/ACT NA SUSP: 2.0000 | Freq: Every day | NASAL | Status: DC

## 2015-03-24 MED ORDER — LEVOTHYROXINE SODIUM 25 MCG PO TABS: 25.0000 ug | ORAL_TABLET | Freq: Every day | ORAL | Status: DC

## 2015-03-24 MED ORDER — METFORMIN HCL 500 MG PO TABS: 500.0000 mg | ORAL_TABLET | Freq: Every day | ORAL | Status: DC

## 2015-03-24 NOTE — Progress Notes (Signed)
Chief Complaint:  Chief Complaint  Patient presents with  . Medication Refill    Amlodipine, Levothyroxine, Meloxicam, Nystatin Cream and Powder  . Diabetic Check    Pt states she has been fasting    HPI: Katherine Sanchez is a 59 y.o. female who reports to [facility] today complaining of medication refills, she would like to do an annual but not sure if her insurance will pay for it, she is fasting.  She states their are a lot of stressors in her life right now, she is taking care of elderly mom and also elderly neighbor who she gets  Lots of stress, no new activities , she has been lifting them Has had a cardiac cath 17 years ago, she has right arm pain, aching pain but it is intermittent and some intermittent left sided CP with rest and exertion and it lasts for a few seconds then goes away for the last 2-3 weeks She has no nausea or vomiting , no diaphoresis, no numbness or tingling, feels her GERD is under control.  She is using CPAP, Dr [name] is no longer at practice so need to transfer care She is taking meds and is not having any SEs however she ran out of meds about 1 month ago, she is having 2 boys who are going to [employer] [place], one is laready there, dad had to go move him a tthe last minute and the other one is going to [employer] so she is moving him in. It has been very chaotic and she has not had time to take care of herself, eating wise or execise wise. She is thinking of getting an ellipitical soon.   Past Medical History  Diagnosis Date  . Hypertension   . Hyperlipidemia   . Thyroid disease   . Heart murmur   . Abdominal distension   . Constipation   . Incisional hernia   . Pneumonia     hx of 2006   . Sleep apnea     cpap x 10 yrs Dr [name]   . Hypothyroidism   . Arthritis     knees   . GERD (gastroesophageal reflux disease)    Past Surgical History  Procedure Laterality Date  . Knee surgery  2006    left - arthroscopic  . Cholecystectomy  2010    lap with  open redo umbilical hernia repair.  Dr. [name]  . Cardiac catheterization      1999 and negative  . Appendectomy  1982  . Hernia repair  [id number]    umbilical open w mesh plug. Dr. [name] and 2010 Dr [name]   . Ventral hernia repair  08/13/2011    Procedure: LAPAROSCOPIC VENTRAL HERNIA;  Surgeon: Adin Hector, MD;  Location: [facility];  Service: General;  Laterality: N/A;  Laparoscopic Ventral Hernia Repair with Mesh   History   Social History  . Marital Status: Married    Spouse Name: N/A  . Number of Children: N/A  . Years of Education: N/A   Occupational History  . 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)    Social History Main Topics  . Smoking status: Former Smoker -- 0.10 packs/day for 4 years    Types: Cigarettes    Quit date: 07/07/1978  . Smokeless tobacco: Never Used     Comment: occassional smoker with alcohol consumption  . Alcohol Use: No     Comment: quit 1979  . Drug Use: No  . Sexual Activity: Yes  Birth Control/ Protection: None     Comment: number of sex partners in the last 64 months 1   Other Topics Concern  . None   Social History Narrative   Lives with husband and his mother is currently living with them after sustaining a fall in Oct. 2013.   Exercise walking 2 times daily   Family History  Problem Relation Age of Onset  . Cancer Mother     colon  . Heart disease Mother 2    congestive heart failure  . COPD Mother   . Other Father 84    car accident  . Cancer Maternal Grandmother     ovarian  . Asthma Son   . Allergies Son   . Hypertension Paternal Grandmother   . Cancer Paternal Grandfather     prostate   Allergies  Allergen Reactions  . Amoxil [Amoxicillin]     hives   Prior to Admission medications   Medication Sig Start Date End Date Taking? Authorizing Provider  amLODipine-benazepril (LOTREL) 10-20 MG per capsule Take 1 capsule by mouth daily. PATIENT NEEDS OFFICE VISIT FOR ADDITIONAL REFILLS 03/24/15  Yes Ravenne Wayment P Giulio Bertino, DO  Ascorbic  Acid (VITAMIN C) POWD Take 1,000 mg by mouth daily.    Yes Historical Provider, MD  aspirin 81 MG tablet Take 81 mg by mouth daily.   Yes Historical Provider, MD  docusate sodium (COLACE) 100 MG capsule Take 100 mg by mouth 2 (two) times daily as needed.     Yes Historical Provider, MD  fluticasone (FLONASE) 50 MCG/ACT nasal spray Place 2 sprays into both nostrils daily. 03/24/15  Yes Vale Peraza P Sherita Decoste, DO  ibuprofen (ADVIL,MOTRIN) 200 MG tablet Take 200 mg by mouth every 6 (six) hours as needed.     Yes Historical Provider, MD  levothyroxine (SYNTHROID, LEVOTHROID) 25 MCG tablet Take 1 tablet (25 mcg total) by mouth daily before breakfast. 03/24/15  Yes Kanda Deluna P Roniesha Hollingshead, DO  meloxicam (MOBIC) 15 MG tablet Take 1 tablet (15 mg total) by mouth daily. Do not take with other NSAIDs, take with food. 03/24/15  Yes Myshawn Chiriboga P Doretta Remmert, DO  nystatin (MYCOSTATIN/NYSTOP) 100000 UNIT/GM POWD Apply to affected area BID prn 04/13/14  Yes Aprel Egelhoff P Alizah Sills, DO  nystatin cream (MYCOSTATIN) Apply 1 application topically 2 (two) times daily. 04/13/14  Yes Abby Stines P Emileigh Kellett, DO  blood glucose meter kit and supplies KIT Dispense based on patient and insurance preference. Use up to one time as directed  (FOR ICD-9 250.00, 250.01). 03/24/15   Beryle Zeitz P Bee Marchiano, DO  cetirizine (ZYRTEC) 10 MG tablet Take 10 mg by mouth daily as needed. Pt takes [employer] brand  Allergy med which is Aller-Tec     Historical Provider, MD  metFORMIN (GLUCOPHAGE) 500 MG tablet Take 1 tablet (500 mg total) by mouth daily with breakfast. 03/24/15   Makinzi Prieur P Jahfari Ambers, DO     ROS: The patient denies fevers, chills, night sweats, unintentional weight loss,  palpitations, wheezing, dyspnea on exertion, nausea, vomiting, abdominal pain, dysuria, hematuria, melena, numbness, weakness, or tingling.  All other systems have been reviewed and were otherwise negative with the exception of those mentioned in the HPI and as above.    PHYSICAL EXAM: Filed Vitals:   03/24/15 0927  BP: 100/64  Pulse: 73  Temp: 97.7 F  (36.5 C)  Resp: 14   SpO2 Readings from Last 3 Encounters:  03/24/15 97%  04/13/14 97%  02/16/14 97%    Body mass index is 40 kg/(m^2).  General: Alert, no acute distress HEENT:  Normocephalic, atraumatic, oropharynx patent. EOMI, PERRLA, fundo exam normal, tm normal, no appreciable thyrodimegaly Cardiovascular:  Regular rate and rhythm, no rubs murmurs or gallops.  No Carotid bruits, radial pulse intact. No pedal edema. Minimally tender on palptation right chest wall Respiratory: Clear to auscultation bilaterally.  No wheezes, rales, or rhonchi.  No cyanosis, no use of accessory musculature Abdominal: No organomegaly, abdomen is soft and non-tender, positive bowel sounds. No masses. Skin: No rashes. Neurologic: Facial musculature symmetric. Psychiatric: Patient acts appropriately throughout our interaction. Lymphatic: No cervical or submandibular lymphadenopathy Musculoskeletal: Gait intact. No edema, tenderness 2/2 DTRs, 5/5 UE and Zamara Cozad strangth  LABS: Results for orders placed or performed in visit on 03/24/15  POCT glycosylated hemoglobin (Hb A1C)  Result Value Ref Range   Hemoglobin A1C 7.9   POCT glucose (manual entry)  Result Value Ref Range   POC Glucose 144 (A) 70 - 99 mg/dl  POCT CBC  Result Value Ref Range   WBC 5.8 4.6 - 10.2 K/uL   Lymph, poc 2.2 0.6 - 3.4   POC LYMPH PERCENT 38.1 10 - 50 %L   MID (cbc) 0.4 0 - 0.9   POC MID % 7.6 0 - 12 %M   POC Granulocyte 3.1 2 - 6.9   Granulocyte percent 54.3 37 - 80 %G   RBC 5.48 4.04 - 5.48 M/uL   Hemoglobin 15.3 12.2 - 16.2 g/dL   HCT, POC 46.3 37.7 - 47.9 %   MCV 84.5 80 - 97 fL   MCH, POC 28.0 27 - 31.2 pg   MCHC 33.1 31.8 - 35.4 g/dL   RDW, POC 13.9 %   Platelet Count, POC 260 142 - 424 K/uL   MPV 7.6 0 - 99.8 fL     EKG/XRAY:   Primary read interpreted by Dr. Marin Comment at [facility].   ASSESSMENT/PLAN: Encounter Diagnoses  Name Primary?  . Essential hypertension Yes  . Hypothyroidism, unspecified hypothyroidism  type   . OSA on CPAP   . Type 2 diabetes mellitus without complication   . Arthritis   . Other specified hypothyroidism   . Chest pain, unspecified chest pain type    Nl ekg, stat Troponin pending, labs pending Return in 3 months Increased metformin from 500 mg daily to 1 gram bid due to to uncontrolled DM, refer to DM education Refer to cardiology for atypical chest pain, I suspect it may be stress induced and laso increase lfiting activities since she is caring for 2 elderly people However she has significant risk factors ie HTN, DM, hyperlipidemia  Gross sideeffects, risk and benefits, and alternatives of medications d/w patient. Patient is aware that all medications have potential sideeffects and we are unable to predict every sideeffect or drug-drug interaction that may occur.  Danish Ruffins DO  03/24/2015 12:08 PM

## 2015-03-24 NOTE — Patient Instructions (Signed)
Diabetes Mellitus and Food It is important for you to manage your blood sugar (glucose) level. Your blood glucose level can be greatly affected by what you eat. Eating healthier foods in the appropriate amounts throughout the day at about the same time each day will help you control your blood glucose level. It can also help slow or prevent worsening of your diabetes mellitus. Healthy eating may even help you improve the level of your blood pressure and reach or maintain a healthy weight.  HOW CAN FOOD AFFECT ME? Carbohydrates Carbohydrates affect your blood glucose level more than any other type of food. Your dietitian will help you determine how many carbohydrates to eat at each meal and teach you how to count carbohydrates. Counting carbohydrates is important to keep your blood glucose at a healthy level, especially if you are using insulin or taking certain medicines for diabetes mellitus. Alcohol Alcohol can cause sudden decreases in blood glucose (hypoglycemia), especially if you use insulin or take certain medicines for diabetes mellitus. Hypoglycemia can be a life-threatening condition. Symptoms of hypoglycemia (sleepiness, dizziness, and disorientation) are similar to symptoms of having too much alcohol.  If your health care provider has given you approval to drink alcohol, do so in moderation and use the following guidelines:  Women should not have more than one drink per day, and men should not have more than two drinks per day. One drink is equal to:  12 oz of beer.  5 oz of wine.  1 oz of hard liquor.  Do not drink on an empty stomach.  Keep yourself hydrated. Have water, diet soda, or unsweetened iced tea.  Regular soda, juice, and other mixers might contain a lot of carbohydrates and should be counted. WHAT FOODS ARE NOT RECOMMENDED? As you make food choices, it is important to remember that all foods are not the same. Some foods have fewer nutrients per serving than other  foods, even though they might have the same number of calories or carbohydrates. It is difficult to get your body what it needs when you eat foods with fewer nutrients. Examples of foods that you should avoid that are high in calories and carbohydrates but low in nutrients include:  Trans fats (most processed foods list trans fats on the Nutrition Facts label).  Regular soda.  Juice.  Candy.  Sweets, such as cake, pie, doughnuts, and cookies.  Fried foods. WHAT FOODS CAN I EAT? Have nutrient-rich foods, which will nourish your body and keep you healthy. The food you should eat also will depend on several factors, including:  The calories you need.  The medicines you take.  Your weight.  Your blood glucose level.  Your blood pressure level.  Your cholesterol level. You also should eat a variety of foods, including:  Protein, such as meat, poultry, fish, tofu, nuts, and seeds (lean animal proteins are best).  Fruits.  Vegetables.  Dairy products, such as milk, cheese, and yogurt (low fat is best).  Breads, grains, pasta, cereal, rice, and beans.  Fats such as olive oil, trans fat-free margarine, canola oil, avocado, and olives. DOES EVERYONE WITH DIABETES MELLITUS HAVE THE SAME MEAL PLAN? Because every person with diabetes mellitus is different, there is not one meal plan that works for everyone. It is very important that you meet with a dietitian who will help you create a meal plan that is just right for you. Document Released: 05/07/2005 Document Revised: 08/15/2013 Document Reviewed: 07/07/2013 [employer] Patient Information 2015 [employer]. This   information is not intended to replace advice given to you by your health care provider. Make sure you discuss any questions you have with your health care provider. Diabetes and Standards of Medical Care Diabetes is complicated. You may find that your diabetes team includes a dietitian, nurse, diabetes educator, eye doctor,  and more. To help everyone know what is going on and to help you get the care you deserve, the following schedule of care was developed to help keep you on track. Below are the tests, exams, vaccines, medicines, education, and plans you will need. HbA1c test This test shows how well you have controlled your glucose over the past 2-3 months. It is used to see if your diabetes management plan needs to be adjusted.   It is performed at least 2 times a year if you are meeting treatment goals.  It is performed 4 times a year if therapy has changed or if you are not meeting treatment goals. Blood pressure test  This test is performed at every routine medical visit. The goal is less than 140/90 mm Hg for most people, but 130/80 mm Hg in some cases. Ask your health care provider about your goal. Dental exam  Follow up with the dentist regularly. Eye exam  If you are diagnosed with type 1 diabetes as a child, get an exam upon reaching the age of 10 years or older and have had diabetes for 3-5 years. Yearly eye exams are recommended after that initial eye exam.  If you are diagnosed with type 1 diabetes as an adult, get an exam within 5 years of diagnosis and then yearly.  If you are diagnosed with type 2 diabetes, get an exam as soon as possible after the diagnosis and then yearly. Foot care exam  Visual foot exams are performed at every routine medical visit. The exams check for cuts, injuries, or other problems with the feet.  A comprehensive foot exam should be done yearly. This includes visual inspection as well as assessing foot pulses and testing for loss of sensation.  Check your feet nightly for cuts, injuries, or other problems with your feet. Tell your health care provider if anything is not healing. Kidney function test (urine microalbumin)  This test is performed once a year.  Type 1 diabetes: The first test is performed 5 years after diagnosis.  Type 2 diabetes: The first test is  performed at the time of diagnosis.  A serum creatinine and estimated glomerular filtration rate (eGFR) test is done once a year to assess the level of chronic kidney disease (CKD), if present. Lipid profile (cholesterol, HDL, LDL, triglycerides)  Performed every 5 years for most people.  The goal for LDL is less than 100 mg/dL. If you are at high risk, the goal is less than 70 mg/dL.  The goal for HDL is 40 mg/dL-50 mg/dL for men and 50 mg/dL-60 mg/dL for women. An HDL cholesterol of 60 mg/dL or higher gives some protection against heart disease.  The goal for triglycerides is less than 150 mg/dL. Influenza vaccine, pneumococcal vaccine, and hepatitis B vaccine  The influenza vaccine is recommended yearly.  It is recommended that people with diabetes who are over 65 years old get the pneumonia vaccine. In some cases, two separate shots may be given. Ask your health care provider if your pneumonia vaccination is up to date.  The hepatitis B vaccine is also recommended for adults with diabetes. Diabetes self-management education  Education is recommended at diagnosis and   at diagnosis and ongoing as needed. Treatment plan  Your treatment plan is reviewed at every medical visit. Document Released: 06/07/2009 Document Revised: 12/25/2013 Document Reviewed: 01/10/2013 [facility] Patient Information 2015 [place], [place]. This information is not intended to replace advice given to you by your health care provider. Make sure you discuss any questions you have with your health care provider.

## 2015-03-24 NOTE — Telephone Encounter (Signed)
Called patient's mobile number and spoke with her about [facility] lab which was normal. Katherine Sanchez, [place]

## 2015-03-26 ENCOUNTER — Other Ambulatory Visit: Payer: Self-pay | Admitting: Family Medicine

## 2015-03-26 DIAGNOSIS — R079 Chest pain, unspecified: Secondary | ICD-10-CM

## 2015-04-18 ENCOUNTER — Ambulatory Visit: Payer: Federal, State, Local not specified - PPO

## 2015-04-25 ENCOUNTER — Ambulatory Visit: Payer: Federal, State, Local not specified - PPO

## 2015-04-27 ENCOUNTER — Other Ambulatory Visit: Payer: Self-pay | Admitting: Family Medicine

## 2015-05-02 ENCOUNTER — Ambulatory Visit: Payer: Federal, State, Local not specified - PPO

## 2015-05-17 ENCOUNTER — Other Ambulatory Visit: Payer: Self-pay | Admitting: Family Medicine

## 2015-07-25 ENCOUNTER — Encounter: Payer: Self-pay | Admitting: *Deleted

## 2015-08-17 ENCOUNTER — Other Ambulatory Visit: Payer: Self-pay | Admitting: Family Medicine

## 2015-09-15 ENCOUNTER — Other Ambulatory Visit: Payer: Self-pay | Admitting: Family Medicine

## 2015-12-05 ENCOUNTER — Other Ambulatory Visit: Payer: Self-pay | Admitting: Family Medicine

## 2015-12-11 ENCOUNTER — Other Ambulatory Visit: Payer: Self-pay | Admitting: Family Medicine

## 2016-03-06 ENCOUNTER — Other Ambulatory Visit: Payer: Self-pay | Admitting: Family Medicine

## 2016-04-11 ENCOUNTER — Other Ambulatory Visit: Payer: Self-pay | Admitting: Family Medicine

## 2016-04-29 DIAGNOSIS — K080 Exfoliation of teeth due to systemic causes: Secondary | ICD-10-CM | POA: Diagnosis not present

## 2016-06-11 DIAGNOSIS — E785 Hyperlipidemia, unspecified: Secondary | ICD-10-CM | POA: Diagnosis not present

## 2016-06-11 DIAGNOSIS — I10 Essential (primary) hypertension: Secondary | ICD-10-CM | POA: Diagnosis not present

## 2016-06-11 DIAGNOSIS — I350 Nonrheumatic aortic (valve) stenosis: Secondary | ICD-10-CM | POA: Diagnosis not present

## 2016-06-11 DIAGNOSIS — E1165 Type 2 diabetes mellitus with hyperglycemia: Secondary | ICD-10-CM | POA: Diagnosis not present

## 2016-06-12 ENCOUNTER — Ambulatory Visit (HOSPITAL_BASED_OUTPATIENT_CLINIC_OR_DEPARTMENT_OTHER)
Admission: RE | Admit: 2016-06-12 | Discharge: 2016-06-12 | Disposition: A | Discharge status: Home / Self Care | Payer: Federal, State, Local not specified - PPO | Source: Ambulatory Visit | Attending: Family Medicine | Admitting: Family Medicine

## 2016-06-12 ENCOUNTER — Ambulatory Visit (INDEPENDENT_AMBULATORY_CARE_PROVIDER_SITE_OTHER): Payer: Federal, State, Local not specified - PPO | Admitting: Family Medicine

## 2016-06-12 VITALS — BP 124/86 | HR 70 | Temp 97.7°F | Resp 17 | Ht 62.0 in | Wt 213.0 lb

## 2016-06-12 DIAGNOSIS — R519 Headache, unspecified: Secondary | ICD-10-CM

## 2016-06-12 DIAGNOSIS — R51 Headache: Secondary | ICD-10-CM | POA: Diagnosis not present

## 2016-06-12 LAB — COMPREHENSIVE METABOLIC PANEL
ALBUMIN: 4.2 g/dL (ref 3.6–5.1)
ALK PHOS: 80 U/L (ref 33–130)
ALT: 29 U/L (ref 6–29)
AST: 28 U/L (ref 10–35)
BILIRUBIN TOTAL: 0.6 mg/dL (ref 0.2–1.2)
BUN: 12 mg/dL (ref 7–25)
CO2: 25 mmol/L (ref 20–31)
CREATININE: 0.69 mg/dL (ref 0.50–1.05)
Calcium: 9.3 mg/dL (ref 8.6–10.4)
Chloride: 104 mmol/L (ref 98–110)
Glucose, Bld: 105 mg/dL — ABNORMAL HIGH (ref 65–99)
Potassium: 4.4 mmol/L (ref 3.5–5.3)
SODIUM: 138 mmol/L (ref 135–146)
TOTAL PROTEIN: 7.2 g/dL (ref 6.1–8.1)

## 2016-06-12 LAB — POCT URINALYSIS DIP (MANUAL ENTRY)
BILIRUBIN UA: NEGATIVE
Bilirubin, UA: NEGATIVE
Glucose, UA: NEGATIVE
Leukocytes, UA: NEGATIVE
Nitrite, UA: NEGATIVE
PH UA: 5.5
Protein Ur, POC: NEGATIVE
RBC UA: NEGATIVE
SPEC GRAV UA: 1.01
Urobilinogen, UA: 0.2

## 2016-06-12 LAB — TSH: TSH: 3.32 m[IU]/L

## 2016-06-12 LAB — GLUCOSE, POCT (MANUAL RESULT ENTRY): POC GLUCOSE: 103 mg/dL — AB (ref 70–99)

## 2016-06-12 LAB — POCT GLYCOSYLATED HEMOGLOBIN (HGB A1C): Hemoglobin A1C: 6.3

## 2016-06-12 MED ORDER — KETOROLAC TROMETHAMINE 60 MG/2ML IM SOLN
60.0000 mg | Freq: Once | INTRAMUSCULAR | Status: AC
  Administered 2016-06-12: 60 mg via INTRAMUSCULAR

## 2016-06-12 MED ORDER — PROMETHAZINE HCL 25 MG/ML IJ SOLN
25.0000 mg | Freq: Once | INTRAMUSCULAR | Status: AC
  Administered 2016-06-12: 25 mg via INTRAMUSCULAR

## 2016-06-12 MED ORDER — BUTALBITAL-APAP-CAFFEINE 50-325-40 MG PO TABS: 1.0000 | ORAL_TABLET | Freq: Four times a day (QID) | ORAL | 0 refills | Status: AC | PRN

## 2016-06-12 MED ORDER — AMLODIPINE BESY-BENAZEPRIL HCL 10-20 MG PO CAPS: 1.0000 | ORAL_CAPSULE | Freq: Every day | ORAL | 1 refills | Status: DC

## 2016-06-12 NOTE — Progress Notes (Signed)
By signing my name below, I, Mesha Guinyard, attest that this documentation has been prepared under the direction and in the presence of Delman Cheadle, MD.  Electronically Signed: Verlee Monte, Medical Scribe. 06/12/16. 12:36 PM.  Subjective:    Patient ID: Katherine Sanchez, female    DOB: [birth year], 60 y.o.   MRN: [id number]  HPI Chief Complaint  Patient presents with  . Annual Exam  . Headache    Onset last night  . Dizziness    onset today  . Blurred Vision    today in office    HPI Comments: Katherine Sanchez is a 60 y.o. female who presents to the [facility] for HA onset yesterday. Pt reports worsening HA onset yesterday. Pt does not usually get HAs and states this is the worse HA she's ever had. Pt took amlodipine after leaving Dr. Einar Gip yesterday and felt a dull pain in her head 3 hours after taking it yesterday and woke up with a dull HA this morning. Pt had a boiled egg with her metformin and amlodipine this morning. Pt wasn't taking amlodipine for 1.5 weeks since she ran out and her bp was running 180/102 at one point but it normally runs around 130/90. Pt reports frontal HA, blurry vision that caused dizziness while she was trying to fill out paper work today, and photophobia today. Pt had N/V/D, tremors, and chills 2 days ago, but felt better the next day except for residual ear pain that continued. Pt was told by her coworkers there was a stomach virus floating around. Pt denies recent falls   Immunizations: Pt had her flu shot 3 days ago.  Patient Active Problem List   Diagnosis Date Noted  . OSA (obstructive sleep apnea) 06/28/2013  . Obesity (BMI 30-39.9) 07/08/2011  . Essential hypertension, benign 10/17/2007  . OTHER SPEC FORMS CHRONIC ISCHEMIC HEART DISEASE 10/17/2007  . Recurrent ventral incisional hernia, periumbilical 72/82/0601  . OSTEOARTHRITIS 10/14/2007  . ABNORMAL GLUCOSE NEC 10/14/2007   Past Medical History:  Diagnosis Date  .  Abdominal distension   . Arthritis    knees   . Constipation   . GERD (gastroesophageal reflux disease)   . Heart murmur   . Hyperlipidemia   . Hypertension   . Hypothyroidism   . Incisional hernia   . Pneumonia    hx of 2006   . Sleep apnea    cpap x 10 yrs Dr [name]   . Thyroid disease    Past Surgical History:  Procedure Laterality Date  . APPENDECTOMY  1982  . CARDIAC CATHETERIZATION     1999 and negative  . CHOLECYSTECTOMY  2010   lap with open redo umbilical hernia repair.  Dr. [name]  . HERNIA REPAIR  5615   umbilical open w mesh plug. Dr. [name] and 2010 Dr [name]   . KNEE SURGERY  2006   left - arthroscopic  . VENTRAL HERNIA REPAIR  08/13/2011   Procedure: LAPAROSCOPIC VENTRAL HERNIA;  Surgeon: Adin Hector, MD;  Location: [facility];  Service: General;  Laterality: N/A;  Laparoscopic Ventral Hernia Repair with Mesh   Allergies  Allergen Reactions  . Amoxil [Amoxicillin]     hives   Prior to Admission medications   Medication Sig Start Date End Date Taking? Authorizing Provider  amLODipine-benazepril (LOTREL) 10-40 MG capsule Take 1 capsule by mouth daily.   Yes Historical Provider, MD  Ascorbic Acid (VITAMIN C) POWD Take 1,000 mg by mouth daily.  Yes Historical Provider, MD  aspirin 81 MG tablet Take 81 mg by mouth daily.   Yes Historical Provider, MD  blood glucose meter kit and supplies KIT Dispense based on patient and insurance preference. Use up to one time as directed  (FOR ICD-9 250.00, 250.01). 03/24/15  Yes [name] Le, DO  docusate sodium (COLACE) 100 MG capsule Take 100 mg by mouth 2 (two) times daily as needed.     Yes Historical Provider, MD  fluticasone (FLONASE) 50 MCG/ACT nasal spray PLACE 2 SPRAYS INTO BOTH NOSTRILS EVERY DAY 12/06/15  Yes [name] Le, DO  ibuprofen (ADVIL,MOTRIN) 200 MG tablet Take 200 mg by mouth every 6 (six) hours as needed.     Yes Historical Provider, MD  levothyroxine (SYNTHROID, LEVOTHROID) 25 MCG tablet Take 1 tablet  (25 mcg total) by mouth daily before breakfast. 03/24/15  Yes [name] Le, DO  meloxicam (MOBIC) 15 MG tablet TAKE 1 TABLET BY MOUTH DAILY. DO NOT TAKE WITH OTHER NSAIDS, TAKE WITH FOOD. 09/17/15  Yes Chelle Jeffery, PA-C  metFORMIN (GLUCOPHAGE) 1000 MG tablet Take 1 tablet (1,000 mg total) by mouth 2 (two) times daily with a meal. 03/24/15  Yes [name] Le, DO  nystatin (MYCOSTATIN/NYSTOP) 100000 UNIT/GM POWD Apply to affected area BID prn 04/13/14  Yes [name] Le, DO  nystatin cream (MYCOSTATIN) Apply 1 application topically 2 (two) times daily. 04/13/14  Yes [name] Le, DO  ONE TOUCH ULTRA TEST test strip USE UP TO 1 TIME AS DIRECTED 12/11/15  Yes [name] Le, DO  ONETOUCH DELICA LANCETS 50V MISC USE UP TO 1 TIME AS DIRECTED 12/11/15  Yes [name] Le, DO   Social History   Social History  . Marital status: Married    Spouse name: N/A  . Number of children: N/A  . Years of education: N/A   Occupational History  . 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)    Social History Main Topics  . Smoking status: Former Smoker    Packs/day: 0.10    Years: 4.00    Types: Cigarettes    Quit date: 07/07/1978  . Smokeless tobacco: Never Used     Comment: occassional smoker with alcohol consumption  . Alcohol use No     Comment: quit 1979  . Drug use: No  . Sexual activity: Yes    Birth control/ protection: None     Comment: number of sex partners in the last 20 months 1   Other Topics Concern  . Not on file   Social History Narrative   Lives with husband and his mother is currently living with them after sustaining a fall in Oct. 2013.   Exercise walking 2 times daily   Family History  Problem Relation Age of Onset  . Cancer Mother     colon  . Heart disease Mother 6    congestive heart failure  . COPD Mother   . Other Father 90    car accident  . Cancer Maternal Grandmother     ovarian  . Asthma Son   . Allergies Son   . Hypertension Paternal Grandmother   . Cancer Paternal Grandfather     prostate     Review of Systems  Constitutional: Positive for chills.  HENT: Positive for ear pain.   Eyes: Positive for photophobia and visual disturbance.  Gastrointestinal: Positive for diarrhea, nausea and vomiting.  Neurological: Positive for dizziness, tremors and headaches.  All other systems reviewed and are negative.  Objective:  Physical Exam  Constitutional: She appears well-developed and well-nourished. No distress.  HENT:  Head: Normocephalic and atraumatic.  Right Ear: A middle ear effusion is present.  Some fluid behind her right ear  Eyes: Conjunctivae are normal.  Neck: Neck supple.  Thyroid TTP but nl size Tenderness an spasms in the postcervical spinal  Cardiovascular: Normal rate.   Murmur heard.  Systolic (pan) murmur is present with a grade of 3/6  Pulmonary/Chest: Effort normal and breath sounds normal. No respiratory distress. She has no wheezes. She exhibits no tenderness.  Musculoskeletal: She exhibits no edema (lower extremity).  Lymphadenopathy:    She has no cervical adenopathy.  Neurological: She is alert. She displays a negative Romberg sign.  Negative pronator drift  Skin: Skin is warm and dry.  Psychiatric: She has a normal mood and affect. Her behavior is normal.  Nursing note and vitals reviewed.  BP 124/86 (BP Location: Right Arm, Patient Position: Sitting, Cuff Size: Normal)   Pulse 70   Temp 97.7 F (36.5 C) (Oral)   Resp 17   Ht '5\' 2"'  (1.575 m)   Wt 213 lb (96.6 kg)   SpO2 98%   BMI 38.96 kg/m    Results for orders placed or performed in visit on 06/12/16  POCT glycosylated hemoglobin (Hb A1C)  Result Value Ref Range   Hemoglobin A1C 6.3   POCT glucose (manual entry)  Result Value Ref Range   POC Glucose 103 (A) 70 - 99 mg/dl   [2:01 PM] Pt feels slightly better, but her HA is still persistent.  Assessment & Plan:   1. Acute intractable headache, unspecified headache type   Pt came in for her CPE today and to establish care with  me. However, she has had the worst HA of her life started yest and is in sig pain so CPE was deferred until next OV at pt's convenience.   Fortunately, HA pain did improve somewhat with promethazine and toradol but since it is stlil sig and worse HA of her life, we will proceed with stat head CT.  If neg/normal, try prn fioricet. To ER if has not resolved.  Orders Placed This Encounter  Procedures  . CT Head Wo Contrast    Standing Status:   Future    Number of Occurrences:   1    Standing Expiration Date:   09/12/2017    Order Specific Question:   Reason for Exam (SYMPTOM  OR DIAGNOSIS REQUIRED)    Answer:   frontal headache    Order Specific Question:   Is patient pregnant?    Answer:   No    Order Specific Question:   Preferred imaging location?    Answer:   [facility]  . Comprehensive metabolic panel  . TSH  . POCT glycosylated hemoglobin (Hb A1C)  . POCT glucose (manual entry)  . POCT urinalysis dipstick    Meds ordered this encounter  Medications  . DISCONTD: amLODipine-benazepril (LOTREL) 10-40 MG capsule    Sig: Take 1 capsule by mouth daily.  Marland Kitchen ketorolac (TORADOL) injection 60 mg  . promethazine (PHENERGAN) injection 25 mg  . amLODipine-benazepril (LOTREL) 10-20 MG capsule    Sig: Take 1 capsule by mouth daily.    Dispense:  90 capsule    Refill:  1  . butalbital-acetaminophen-caffeine (FIORICET, ESGIC) 50-325-40 MG tablet    Sig: Take 1-2 tablets by mouth every 6 (six) hours as needed for headache.    Dispense:  40 tablet  Refill:  0    I personally performed the services described in this documentation, which was scribed in my presence. The recorded information has been reviewed and considered, and addended by me as needed.   Delman Cheadle, M.D.  [facility] [address] [place], [place] [address] [phone] phone [phone] fax  06/21/16 11:29 PM

## 2016-06-12 NOTE — Patient Instructions (Addendum)
Please report to [facility] on [address] off [address].  You can go there now.  The phone number is [phone] if you have any questions.    IF you received an x-ray today, you will receive an invoice from [facility]. Please contact [facility] at [phone] with questions or concerns regarding your invoice.   IF you received labwork today, you will receive an invoice from [facility]. Please contact [facility] at [phone] with questions or concerns regarding your invoice.   Our billing staff will not be able to assist you with questions regarding bills from these companies.  You will be contacted with the lab results as soon as they are available. The fastest way to get your results is to activate your My Chart account. Instructions are located on the last page of this paperwork. If you have not heard from [place] regarding the results in 2 weeks, please contact this office.

## 2016-07-01 NOTE — Progress Notes (Addendum)
Subjective:    Patient ID: Katherine Sanchez, female    DOB: 1955-12-03, 60 y.o.   MRN: [id number] Chief Complaint  Patient presents with  . Annual Exam    HPI  Katherine Sanchez is a delightful 60 yo woman here today for a full physical and to establish care with me.  She had come in for this 3 wks prior but had developed the worst HA of her life the day prior which had not resolved so we had to do an acute visit instead.  She did have a nml head CT and her HA was relieved after IM toradol and fioricet and has not recurred since.  Primary preventative screening: Cervical cancer: no h/o abnml. nml on 04/13/14 so will need repeat today with HPV testing and then again at 65 Breast cancer: mammogram >2 yrs prior at the breast center  CRC:  Immunizations: flu annually and done last mo  Chronic medical conditions:  HTN: On amlodipine-benazepril 10-20 qd - she takes it with lunch every day. Checks BP at home and is usu 110-120s/70-80.   HLD: Is fasting Hypothyroid: On levothyroxine 25 mcg taken every morning on an empty stomach OSA: on cpap, saw Dr. [name] but hasn't had a sleep study in >10-15 yrs (since her 12s). Feels like her settings may no longer be correct as she is noting more nighttime awakenings and morning fatigue.  She does have new machine relatively recently. Once in the hosp when she was recovering they put o2 in her cpap and she noticed that she slept much better. DM: Diagnosed 2 yrs prior. Seen annually.  Optho??  a1c this mo was 6.3 much improved from 7.9 last year. Was on metformin 1g bid but would have run out >3 mos prior.   She still has one metformin refill left so has no idea how she has so many left. Never went to DM teaching due to life stressors.  Occ checks her cbgs right after diiner and usu 120s-150s.  Tolerating well. Had a cardiac cath in 1999    Arthralgias: occ knee pain. Has had arthroscopy prior   Maternal GM with ovarian cancer in her 56s. No female cancers in  maternal family.  Depression screen [facility] 2/9 07/02/2016 06/12/2016 03/24/2015 02/16/2014  Decreased Interest 0 0 0 0  Down, Depressed, Hopeless 0 1 0 0  PHQ - 2 Score 0 1 0 0     Past Medical History:  Diagnosis Date  . Abdominal distension   . Arthritis    knees   . Constipation   . GERD (gastroesophageal reflux disease)   . Heart murmur   . Hyperlipidemia   . Hypertension   . Hypothyroidism   . Incisional hernia   . Pneumonia    hx of 2006   . Sleep apnea    cpap x 10 yrs Dr [name]   . Thyroid disease    Past Surgical History:  Procedure Laterality Date  . APPENDECTOMY  1982  . CARDIAC CATHETERIZATION     1999 and negative  . CHOLECYSTECTOMY  2010   lap with open redo umbilical hernia repair.  Dr. [name]  . HERNIA REPAIR  [id number]   umbilical open w mesh plug. Dr. [name] and 2010 Dr [name]   . KNEE SURGERY  2006   left - arthroscopic  . VENTRAL HERNIA REPAIR  08/13/2011   Procedure: LAPAROSCOPIC VENTRAL HERNIA;  Surgeon: Adin Hector, MD;  Location: [facility];  Service: General;  Laterality: N/A;  Laparoscopic Ventral Hernia Repair with Mesh   Current Outpatient Prescriptions on File Prior to Visit  Medication Sig Dispense Refill  . amLODipine-benazepril (LOTREL) 10-20 MG capsule Take 1 capsule by mouth daily. 90 capsule 1  . Ascorbic Acid (VITAMIN C) POWD Take 1,000 mg by mouth daily.     Marland Kitchen aspirin 81 MG tablet Take 81 mg by mouth daily.    . blood glucose meter kit and supplies KIT Dispense based on patient and insurance preference. Use up to one time as directed  (FOR ICD-9 250.00, 250.01). 1 each 0  . butalbital-acetaminophen-caffeine (FIORICET, ESGIC) 50-325-40 MG tablet Take 1-2 tablets by mouth every 6 (six) hours as needed for headache. 40 tablet 0  . docusate sodium (COLACE) 100 MG capsule Take 100 mg by mouth 2 (two) times daily as needed.      . fluticasone (FLONASE) 50 MCG/ACT nasal spray PLACE 2 SPRAYS INTO BOTH NOSTRILS EVERY DAY 48 g 1  . ibuprofen  (ADVIL,MOTRIN) 200 MG tablet Take 200 mg by mouth every 6 (six) hours as needed.      . nystatin (MYCOSTATIN/NYSTOP) 100000 UNIT/GM POWD Apply to affected area BID prn 60 g 1  . nystatin cream (MYCOSTATIN) Apply 1 application topically 2 (two) times daily. 60 g 1  . ONE TOUCH ULTRA TEST test strip USE UP TO 1 TIME AS DIRECTED 100 each 0  . ONETOUCH DELICA LANCETS 07M MISC USE UP TO 1 TIME AS DIRECTED 100 each 0   No current facility-administered medications on file prior to visit.    Allergies  Allergen Reactions  . Amoxil [Amoxicillin]     hives   Family History  Problem Relation Age of Onset  . Cancer Mother     colon  . Heart disease Mother 91    congestive heart failure  . COPD Mother   . Other Father 31    car accident  . Cancer Maternal Grandmother     ovarian  . Asthma Son   . Allergies Son   . Hypertension Paternal Grandmother   . Cancer Paternal Grandfather     prostate   Social History   Social History  . Marital status: Married    Spouse name: N/A  . Number of children: N/A  . Years of education: N/A   Occupational History  . 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)    Social History Main Topics  . Smoking status: Former Smoker    Packs/day: 0.10    Years: 4.00    Types: Cigarettes    Quit date: 07/07/1978  . Smokeless tobacco: Never Used     Comment: occassional smoker with alcohol consumption  . Alcohol use No     Comment: quit 1979  . Drug use: No  . Sexual activity: Yes    Birth control/ protection: None     Comment: number of sex partners in the last 17 months 1   Other Topics Concern  . None   Social History Narrative   Lives with husband and his mother is currently living with them after sustaining a fall in Oct. 2013.   Exercise walking 2 times daily    Review of Systems  Constitutional: Positive for fatigue.  HENT: Positive for ear pain (right) and hearing loss (borderline).   Eyes: Positive for visual disturbance.  Cardiovascular: Positive  for chest pain (none current).  Musculoskeletal: Positive for arthralgias and myalgias.  Allergic/Immunologic: Positive for environmental allergies.  Neurological: Positive for headaches.  Psychiatric/Behavioral: Positive for sleep disturbance.  All other systems reviewed and are negative.      Objective:   Physical Exam  Constitutional: She is oriented to person, place, and time. She appears well-developed and well-nourished. No distress.  HENT:  Head: Normocephalic and atraumatic.  Right Ear: Tympanic membrane, external ear and ear canal normal.  Left Ear: Tympanic membrane, external ear and ear canal normal.  Nose: Mucosal edema present. No rhinorrhea.  Mouth/Throat: Uvula is midline, oropharynx is clear and moist and mucous membranes are normal. No posterior oropharyngeal erythema.  Eyes: Conjunctivae and EOM are normal. Pupils are equal, round, and reactive to light. Right eye exhibits no discharge. Left eye exhibits no discharge. No scleral icterus.  Neck: Normal range of motion. Neck supple. No thyromegaly present.  Cardiovascular: Normal rate, regular rhythm and intact distal pulses.   Murmur heard.  Systolic murmur is present with a grade of 2/6  Pulmonary/Chest: Effort normal and breath sounds normal. No respiratory distress.  Abdominal: Soft. Bowel sounds are normal. There is no tenderness.  Genitourinary: Vagina normal and uterus normal. No breast swelling, tenderness, discharge or bleeding. Cervix exhibits no motion tenderness and no friability. Right adnexum displays no mass and no tenderness. Left adnexum displays no mass and no tenderness.  Musculoskeletal: She exhibits no edema.  Lymphadenopathy:    She has no cervical adenopathy.  Neurological: She is alert and oriented to person, place, and time. She has normal reflexes.  Skin: Skin is warm and dry. She is not diaphoretic. No erythema.  Psychiatric: She has a normal mood and affect. Her behavior is normal.      BP  (!) 142/88 (BP Location: Right Arm, Patient Position: Sitting, Cuff Size: Small)   Pulse 72   Temp 97.8 F (36.6 C) (Oral)   Resp 20   Ht '5\' 2"'  (1.575 m)   Wt 214 lb 3.2 oz (97.2 kg)   SpO2 97%   BMI 39.18 kg/m   Assessment & Plan:  cbc,lipids,urine microalb, hep C, hiv Foot exam Tdap, pneumovax Shingles vaccine   Refer to pulm sleep at [facility] - wonders if she needs to get on oxygen  1. Annual physical exam   2. Type 2 diabetes mellitus without complication, without long-term current use of insulin (HCC)   3. Other specified hypothyroidism   4. Essential hypertension   5. OSA on CPAP   6. Routine screening for STI (sexually transmitted infection)   7. Encounter for screening mammogram for breast cancer   8. Screening for cardiovascular, respiratory, and genitourinary diseases   9. Screening for cervical cancer   10. Screening for colorectal cancer     Orders Placed This Encounter  Procedures  . MM Digital Screening    Standing Status:   Future    Standing Expiration Date:   09/01/2017    Order Specific Question:   Reason for Exam (SYMPTOM  OR DIAGNOSIS REQUIRED)    Answer:   screening    Order Specific Question:   Is the patient pregnant?    Answer:   No    Order Specific Question:   Preferred imaging location?    Answer:   [facility]  . Tdap vaccine greater than or equal to 7yo IM  . Pneumococcal polysaccharide vaccine 23-valent greater than or equal to 2yo subcutaneous/IM  . CBC  . Lipid panel    Order Specific Question:   Has the patient fasted?    Answer:   Yes  . HIV antibody  . Hepatitis C Antibody  .  Microalbumin/Creatinine Ratio, Urine  . Ambulatory referral to Gastroenterology    Referral Priority:   Routine    Referral Type:   Consultation    Referral Reason:   Specialty Services Required    Number of Visits Requested:   1  . Ambulatory referral to Podiatry    Referral Priority:   Routine    Referral Type:   Consultation    Referral Reason:    Specialty Services Required    Requested Specialty:   Podiatry    Number of Visits Requested:   1  . Ambulatory referral to diabetic education    Referral Priority:   Routine    Referral Type:   Consultation    Referral Reason:   Specialty Services Required    Number of Visits Requested:   1  . Ambulatory referral to Sleep Studies    Referral Priority:   Routine    Referral Type:   Consultation    Referral Reason:   Specialty Services Required    Number of Visits Requested:   1  . HM DIABETES FOOT EXAM    Meds ordered this encounter  Medications  . metFORMIN (GLUCOPHAGE) 1000 MG tablet    Sig: Take 1 tablet (1,000 mg total) by mouth 2 (two) times daily with a meal.    Dispense:  180 tablet    Refill:  3  . levothyroxine (SYNTHROID, LEVOTHROID) 25 MCG tablet    Sig: Take 1 tablet (25 mcg total) by mouth daily before breakfast.    Dispense:  90 tablet    Refill:  3  . Zoster Vaccine Live, PF, (ZOSTAVAX) 53646 UNT/0.65ML injection    Sig: Inject 19,400 Units into the skin once.    Dispense:  1 vial    Refill:  0    Katherine Sanchez, M.D.  [facility] [address] [place], [place] [address] [phone] phone [phone] fax  07/10/16 2:19 PM

## 2016-07-02 ENCOUNTER — Ambulatory Visit (INDEPENDENT_AMBULATORY_CARE_PROVIDER_SITE_OTHER): Payer: Federal, State, Local not specified - PPO | Admitting: Family Medicine

## 2016-07-02 ENCOUNTER — Telehealth: Payer: Self-pay

## 2016-07-02 ENCOUNTER — Encounter: Payer: Self-pay | Admitting: Family Medicine

## 2016-07-02 VITALS — BP 142/88 | HR 72 | Temp 97.8°F | Resp 20 | Ht 62.0 in | Wt 214.2 lb

## 2016-07-02 DIAGNOSIS — E119 Type 2 diabetes mellitus without complications: Secondary | ICD-10-CM | POA: Diagnosis not present

## 2016-07-02 DIAGNOSIS — Z23 Encounter for immunization: Secondary | ICD-10-CM

## 2016-07-02 DIAGNOSIS — Z1383 Encounter for screening for respiratory disorder NEC: Secondary | ICD-10-CM | POA: Diagnosis not present

## 2016-07-02 DIAGNOSIS — Z1211 Encounter for screening for malignant neoplasm of colon: Secondary | ICD-10-CM

## 2016-07-02 DIAGNOSIS — Z113 Encounter for screening for infections with a predominantly sexual mode of transmission: Secondary | ICD-10-CM | POA: Diagnosis not present

## 2016-07-02 DIAGNOSIS — Z124 Encounter for screening for malignant neoplasm of cervix: Secondary | ICD-10-CM

## 2016-07-02 DIAGNOSIS — G4733 Obstructive sleep apnea (adult) (pediatric): Secondary | ICD-10-CM

## 2016-07-02 DIAGNOSIS — Z9989 Dependence on other enabling machines and devices: Secondary | ICD-10-CM

## 2016-07-02 DIAGNOSIS — I10 Essential (primary) hypertension: Secondary | ICD-10-CM | POA: Diagnosis not present

## 2016-07-02 DIAGNOSIS — Z1389 Encounter for screening for other disorder: Secondary | ICD-10-CM

## 2016-07-02 DIAGNOSIS — Z136 Encounter for screening for cardiovascular disorders: Secondary | ICD-10-CM

## 2016-07-02 DIAGNOSIS — Z1212 Encounter for screening for malignant neoplasm of rectum: Secondary | ICD-10-CM

## 2016-07-02 DIAGNOSIS — Z1231 Encounter for screening mammogram for malignant neoplasm of breast: Secondary | ICD-10-CM

## 2016-07-02 DIAGNOSIS — E038 Other specified hypothyroidism: Secondary | ICD-10-CM

## 2016-07-02 DIAGNOSIS — Z0000 Encounter for general adult medical examination without abnormal findings: Secondary | ICD-10-CM

## 2016-07-02 LAB — CBC
HCT: 42.9 % (ref 35.0–45.0)
Hemoglobin: 14.6 g/dL (ref 11.7–15.5)
MCH: 28.7 pg (ref 27.0–33.0)
MCHC: 34 g/dL (ref 32.0–36.0)
MCV: 84.3 fL (ref 80.0–100.0)
MPV: 9.9 fL (ref 7.5–12.5)
PLATELETS: 252 10*3/uL (ref 140–400)
RBC: 5.09 MIL/uL (ref 3.80–5.10)
RDW: 13.6 % (ref 11.0–15.0)
WBC: 5.7 10*3/uL (ref 3.8–10.8)

## 2016-07-02 LAB — LIPID PANEL
CHOL/HDL RATIO: 2.9 ratio (ref <5.0)
CHOLESTEROL: 147 mg/dL (ref <200)
HDL: 50 mg/dL — AB (ref >50)
LDL Cholesterol: 79 mg/dL
Triglycerides: 90 mg/dL (ref <150)
VLDL: 18 mg/dL (ref <30)

## 2016-07-02 LAB — MICROALBUMIN / CREATININE URINE RATIO
Creatinine, Urine: 98 mg/dL (ref 20–320)
MICROALB/CREAT RATIO: 11 ug/mg{creat} (ref <30)
Microalb, Ur: 1.1 mg/dL

## 2016-07-02 LAB — HEPATITIS C ANTIBODY: HCV AB: NEGATIVE

## 2016-07-02 LAB — HIV ANTIBODY (ROUTINE TESTING W REFLEX): HIV 1&2 Ab, 4th Generation: NONREACTIVE

## 2016-07-02 MED ORDER — LEVOTHYROXINE SODIUM 25 MCG PO TABS: 25.0000 ug | ORAL_TABLET | Freq: Every day | ORAL | 3 refills | Status: DC

## 2016-07-02 MED ORDER — ZOSTER VACCINE LIVE 19400 UNT/0.65ML ~~LOC~~ SUSR: 0.6500 mL | Freq: Once | SUBCUTANEOUS | 0 refills | Status: AC

## 2016-07-02 MED ORDER — METFORMIN HCL 1000 MG PO TABS: 1000.0000 mg | ORAL_TABLET | Freq: Two times a day (BID) | ORAL | 3 refills | Status: DC

## 2016-07-02 NOTE — Telephone Encounter (Signed)
Patient is asking about her medication list.  She states that she is still taking melaxicam and she wants to make sure the pharmacy still has it in her file.  [phone]

## 2016-07-02 NOTE — Patient Instructions (Addendum)
IF you received an x-ray today, you will receive an invoice from [facility]. Please contact [facility] at [phone] with questions or concerns regarding your invoice.   IF you received labwork today, you will receive an invoice from [employer]. Please contact [facility] at [phone] with questions or concerns regarding your invoice.   Our billing staff will not be able to assist you with questions regarding bills from these companies.  You will be contacted with the lab results as soon as they are available. The fastest way to get your results is to activate your My Chart account. Instructions are located on the last page of this paperwork. If you have not heard from [place] regarding the results in 2 weeks, please contact this office.    Menopause is a normal process in which your reproductive ability comes to an end. This process happens gradually over a span of months to years, usually between the ages of 65 and 57. Menopause is complete when you have missed 12 consecutive menstrual periods. It is important to talk with your health care provider about some of the most common conditions that affect postmenopausal women, such as heart disease, cancer, and bone loss (osteoporosis). Adopting a healthy lifestyle and getting preventive care can help to promote your health and wellness. Those actions can also lower your chances of developing some of these common conditions. WHAT SHOULD I KNOW ABOUT MENOPAUSE? During menopause, you may experience a number of symptoms, such as:  Moderate-to-severe hot flashes.  Night sweats.  Decrease in sex drive.  Mood swings.  Headaches.  Tiredness.  Irritability.  Memory problems.  Insomnia. Choosing to treat or not to treat menopausal changes is an individual decision that you make with your health care provider. WHAT SHOULD I KNOW ABOUT HORMONE REPLACEMENT THERAPY AND SUPPLEMENTS? Hormone therapy  products are effective for treating symptoms that are associated with menopause, such as hot flashes and night sweats. Hormone replacement carries certain risks, especially as you become older. If you are thinking about using estrogen or estrogen with progestin treatments, discuss the benefits and risks with your health care provider. WHAT SHOULD I KNOW ABOUT HEART DISEASE AND STROKE? Heart disease, heart attack, and stroke become more likely as you age. This may be due, in part, to the hormonal changes that your body experiences during menopause. These can affect how your body processes dietary fats, triglycerides, and cholesterol. Heart attack and stroke are both medical emergencies. There are many things that you can do to help prevent heart disease and stroke:  Have your blood pressure checked at least every 1-2 years. High blood pressure causes heart disease and increases the risk of stroke.  If you are 82-15 years old, ask your health care provider if you should take aspirin to prevent a heart attack or a stroke.  Do not use any tobacco products, including cigarettes, chewing tobacco, or electronic cigarettes. If you need help quitting, ask your health care provider.  It is important to eat a healthy diet and maintain a healthy weight.  Be sure to include plenty of vegetables, fruits, low-fat dairy products, and lean protein.  Avoid eating foods that are high in solid fats, added sugars, or salt (sodium).  Get regular exercise. This is one of the most important things that you can do for your health.  Try to exercise for at least 150 minutes each week. The type of exercise that you do should increase your heart rate and make  you sweat. This is known as moderate-intensity exercise.  Try to do strengthening exercises at least twice each week. Do these in addition to the moderate-intensity exercise.  Know your numbers.Ask your health care provider to check your cholesterol and your blood  glucose. Continue to have your blood tested as directed by your health care provider. WHAT SHOULD I KNOW ABOUT CANCER SCREENING? There are several types of cancer. Take the following steps to reduce your risk and to catch any cancer development as early as possible. Breast Cancer  Practice breast self-awareness.  This means understanding how your breasts normally appear and feel.  It also means doing regular breast self-exams. Let your health care provider know about any changes, no matter how small.  If you are 48 or older, have a clinician do a breast exam (clinical breast exam or CBE) every year. Depending on your age, family history, and medical history, it may be recommended that you also have a yearly breast X-ray (mammogram).  If you have a family history of breast cancer, talk with your health care provider about genetic screening.  If you are at high risk for breast cancer, talk with your health care provider about having an MRI and a mammogram every year.  Breast cancer (BRCA) gene test is recommended for women who have family members with BRCA-related cancers. Results of the assessment will determine the need for genetic counseling and BRCA1 and for BRCA2 testing. BRCA-related cancers include these types:  Breast. This occurs in males or females.  Ovarian.  Tubal. This may also be called fallopian tube cancer.  Cancer of the abdominal or pelvic lining (peritoneal cancer).  Prostate.  Pancreatic. Cervical, Uterine, and Ovarian Cancer Your health care provider may recommend that you be screened regularly for cancer of the pelvic organs. These include your ovaries, uterus, and vagina. This screening involves a pelvic exam, which includes checking for microscopic changes to the surface of your cervix (Pap test).  For women ages 21-65, health care providers may recommend a pelvic exam and a Pap test every three years. For women ages 27-65, they may recommend the Pap test and  pelvic exam, combined with testing for human papilloma virus (HPV), every five years. Some types of HPV increase your risk of cervical cancer. Testing for HPV may also be done on women of any age who have unclear Pap test results.  Other health care providers may not recommend any screening for nonpregnant women who are considered low risk for pelvic cancer and have no symptoms. Ask your health care provider if a screening pelvic exam is right for you.  If you have had past treatment for cervical cancer or a condition that could lead to cancer, you need Pap tests and screening for cancer for at least 20 years after your treatment. If Pap tests have been discontinued for you, your risk factors (such as having a new sexual partner) need to be reassessed to determine if you should start having screenings again. Some women have medical problems that increase the chance of getting cervical cancer. In these cases, your health care provider may recommend that you have screening and Pap tests more often.  If you have a family history of uterine cancer or ovarian cancer, talk with your health care provider about genetic screening.  If you have vaginal bleeding after reaching menopause, tell your health care provider.  There are currently no reliable tests available to screen for ovarian cancer. Lung Cancer Lung cancer screening is recommended for  adults 55-80 years old who are at high risk for lung cancer because of a history of smoking. A yearly low-dose CT scan of the lungs is recommended if you:  Currently smoke.  Have a history of at least 30 pack-years of smoking and you currently smoke or have quit within the past 15 years. A pack-year is smoking an average of one pack of cigarettes per day for one year. Yearly screening should:  Continue until it has been 15 years since you quit.  Stop if you develop a health problem that would prevent you from having lung cancer treatment. Colorectal  Cancer  This type of cancer can be detected and can often be prevented.  Routine colorectal cancer screening usually begins at age 50 and continues through age 75.  If you have risk factors for colon cancer, your health care provider may recommend that you be screened at an earlier age.  If you have a family history of colorectal cancer, talk with your health care provider about genetic screening.  Your health care provider may also recommend using home test kits to check for hidden blood in your stool.  A small camera at the end of a tube can be used to examine your colon directly (sigmoidoscopy or colonoscopy). This is done to check for the earliest forms of colorectal cancer.  Direct examination of the colon should be repeated every 5-10 years until age 75. However, if early forms of precancerous polyps or small growths are found or if you have a family history or genetic risk for colorectal cancer, you may need to be screened more often. Skin Cancer  Check your skin from head to toe regularly.  Monitor any moles. Be sure to tell your health care provider:  About any new moles or changes in moles, especially if there is a change in a mole's shape or color.  If you have a mole that is larger than the size of a pencil eraser.  If any of your family members has a history of skin cancer, especially at a young age, talk with your health care provider about genetic screening.  Always use sunscreen. Apply sunscreen liberally and repeatedly throughout the day.  Whenever you are outside, protect yourself by wearing long sleeves, pants, a wide-brimmed hat, and sunglasses. WHAT SHOULD I KNOW ABOUT OSTEOPOROSIS? Osteoporosis is a condition in which bone destruction happens more quickly than new bone creation. After menopause, you may be at an increased risk for osteoporosis. To help prevent osteoporosis or the bone fractures that can happen because of osteoporosis, the following is  recommended:  If you are 19-50 years old, get at least 1,000 mg of calcium and at least 600 mg of vitamin D per day.  If you are older than age 50 but younger than age 70, get at least 1,200 mg of calcium and at least 600 mg of vitamin D per day.  If you are older than age 70, get at least 1,200 mg of calcium and at least 800 mg of vitamin D per day. Smoking and excessive alcohol intake increase the risk of osteoporosis. Eat foods that are rich in calcium and vitamin D, and do weight-bearing exercises several times each week as directed by your health care provider. WHAT SHOULD I KNOW ABOUT HOW MENOPAUSE AFFECTS MY MENTAL HEALTH? Depression may occur at any age, but it is more common as you become older. Common symptoms of depression include:  Low or sad mood.  Changes in sleep patterns.    Changes in appetite or eating patterns.  Feeling an overall lack of motivation or enjoyment of activities that you previously enjoyed.  Frequent crying spells. Talk with your health care provider if you think that you are experiencing depression. WHAT SHOULD I KNOW ABOUT IMMUNIZATIONS? It is important that you get and maintain your immunizations. These include:  Tetanus, diphtheria, and pertussis (Tdap) booster vaccine.  Influenza every year before the flu season begins.  Pneumonia vaccine.  Shingles vaccine. Your health care provider may also recommend other immunizations.   This information is not intended to replace advice given to you by your health care provider. Make sure you discuss any questions you have with your health care provider.   Document Released: 10/02/2005 Document Revised: 08/31/2014 Document Reviewed: 04/12/2014 Elsevier Interactive Patient Education 2016 [employer] Inc.  

## 2016-07-03 ENCOUNTER — Telehealth: Payer: Self-pay | Admitting: Emergency Medicine

## 2016-07-03 MED ORDER — MELOXICAM 15 MG PO TABS: 15.0000 mg | ORAL_TABLET | Freq: Every day | ORAL | 2 refills | Status: DC | PRN

## 2016-07-03 NOTE — Telephone Encounter (Signed)
Patient called back requesting Meloxicam be added to med list as needed for knee pain. States, she has some now but would like RF if needed. Can I order this Dr. Brigitte Pulse?

## 2016-07-07 ENCOUNTER — Encounter: Payer: Self-pay | Admitting: Gastroenterology

## 2016-07-07 LAB — PAP IG AND HPV HIGH-RISK: HPV DNA High Risk: NOT DETECTED

## 2016-07-08 DIAGNOSIS — G4733 Obstructive sleep apnea (adult) (pediatric): Secondary | ICD-10-CM | POA: Diagnosis not present

## 2016-07-13 ENCOUNTER — Telehealth: Payer: Self-pay

## 2016-07-13 NOTE — Telephone Encounter (Signed)
On 11/9 you referred pt for a sleep study. To have the sleep study preformed at [facility], we will need to change it from a referral to an order. If it's okay to send to [facility], let me know as I can send that as a referral.   Thanks!

## 2016-07-13 NOTE — Telephone Encounter (Signed)
Whatever pt prefers or the clinic. I think she had one done at [facility] prior and so normally they are pretty strict about pt following up with their established clinic but I don't want to order the sleep study as I don't want to go through the rx the cpap. Can you check with [facility] to see what they want their osa pts to do since her provider retired? Thanks.

## 2016-07-23 ENCOUNTER — Ambulatory Visit (INDEPENDENT_AMBULATORY_CARE_PROVIDER_SITE_OTHER): Payer: Federal, State, Local not specified - PPO | Admitting: Podiatry

## 2016-07-23 ENCOUNTER — Ambulatory Visit (INDEPENDENT_AMBULATORY_CARE_PROVIDER_SITE_OTHER): Payer: Federal, State, Local not specified - PPO

## 2016-07-23 ENCOUNTER — Encounter: Payer: Self-pay | Admitting: Podiatry

## 2016-07-23 ENCOUNTER — Other Ambulatory Visit: Payer: Self-pay | Admitting: *Deleted

## 2016-07-23 VITALS — BP 130/70 | HR 74 | Resp 16

## 2016-07-23 DIAGNOSIS — M722 Plantar fascial fibromatosis: Secondary | ICD-10-CM | POA: Diagnosis not present

## 2016-07-23 DIAGNOSIS — M2142 Flat foot [pes planus] (acquired), left foot: Secondary | ICD-10-CM | POA: Diagnosis not present

## 2016-07-23 DIAGNOSIS — M19079 Primary osteoarthritis, unspecified ankle and foot: Secondary | ICD-10-CM | POA: Diagnosis not present

## 2016-07-23 DIAGNOSIS — M2141 Flat foot [pes planus] (acquired), right foot: Secondary | ICD-10-CM | POA: Diagnosis not present

## 2016-07-23 NOTE — Progress Notes (Signed)
   Subjective:    Patient ID: Katherine Sanchez, female    DOB: 28-Dec-1955, 60 y.o.   MRN: [id number]  HPI 61 year old female presents the office with concerns of flat feet and pain in the arches of her feet which has been ongoing for several years. She has tried over-the-counter inserts without any significant relief. She denies any numbness or tingling. No recent injury or trauma. No other complaints at this time.   Review of Systems  Constitutional: Positive for unexpected weight change.  Musculoskeletal: Positive for arthralgias and gait problem.  Allergic/Immunologic: Positive for environmental allergies.  All other systems reviewed and are negative.      Objective:   Physical Exam General: AAO x3, NAD  Dermatological: Skin is warm, dry and supple bilateral. Nails x 10 are well manicured; remaining integument appears unremarkable at this time. There are no open sores, no preulcerative lesions, no rash or signs of infection present.  Vascular: Dorsalis Pedis artery and Posterior Tibial artery pedal pulses are 2/4 bilateral with immedate capillary fill time.  There is no pain with calf compression, swelling, warmth, erythema.   Neruologic: Grossly intact via light touch bilateral. Vibratory intact via tuning fork bilateral. Protective threshold with Semmes Wienstein monofilament intact to all pedal sites bilateral. Patellar and Achilles deep tendon reflexes 2+ bilateral.   Musculoskeletal: significant decrease in medial arch upon weightbearing. Subtalar joint range of motion decrease. Ankle joint range of motin intact. There is no area pinpoint bony tenderness. Along the medial arch of the foot a the plantar fasciitis she does get some subjective tenderness however she is not having pain for a period Muscular strength 5/5 in all groups tested bilateral.  Gait: Unassisted, Nonantalgic.      Assessment & Plan:  60 year old female with symptomatic flatfoot, plantar  fasciitis -Treatment options discussed including all alternatives, risks, and complications -Etiology of symptoms were discussed -X-rays were obtained and reviewed with the patient.  -I discussed shoe gear modifications. Discussed CMO which she wishes to proceed with. She was scanned for orthotics and they were sent to [facility] labs.  -Follow-up in 3 weeks or sooner if any problems arise. In the meantime, encouraged to call the office with any questions, concerns, change in symptoms.   Celesta Gentile, DPM

## 2016-07-30 ENCOUNTER — Encounter: Payer: Self-pay | Admitting: *Deleted

## 2016-07-30 ENCOUNTER — Encounter: Payer: Federal, State, Local not specified - PPO | Attending: Family Medicine | Admitting: *Deleted

## 2016-07-30 DIAGNOSIS — Z713 Dietary counseling and surveillance: Secondary | ICD-10-CM | POA: Insufficient documentation

## 2016-07-30 DIAGNOSIS — E119 Type 2 diabetes mellitus without complications: Secondary | ICD-10-CM | POA: Diagnosis not present

## 2016-07-30 NOTE — Progress Notes (Signed)
Diabetes Self-Management Education  Visit Type: First/Initial  Appt. Start Time: 1100 Appt. End Time: 1200  07/30/2016  Ms. Katherine Sanchez, identified by name and date of birth, is a 60 y.o. female with a diagnosis of Diabetes: Type 2.   ASSESSMENT       Diabetes Self-Management Education - 07/30/16 1113      Visit Information   Visit Type First/Initial     Initial Visit   Diabetes Type Type 2   Are you currently following a meal plan? No   Are you taking your medications as prescribed? Yes     Health Coping   How would you rate your overall health? Good     Psychosocial Assessment   Patient Belief/Attitude about Diabetes Motivated to manage diabetes   Self-care barriers None   Other persons present Patient   Patient Concerns Nutrition/Meal planning;Glycemic Control   Special Needs None   Preferred Learning Style No preference indicated   Learning Readiness Ready   What is the last grade level you completed in school? some college     Pre-Education Assessment   Patient understands the diabetes disease and treatment process. Needs Instruction   Patient understands incorporating nutritional management into lifestyle. Needs Instruction   Patient undertands incorporating physical activity into lifestyle. Needs Instruction   Patient understands using medications safely. Needs Instruction   Patient understands monitoring blood glucose, interpreting and using results Needs Instruction   Patient understands prevention, detection, and treatment of acute complications. Needs Instruction   Patient understands prevention, detection, and treatment of chronic complications. Needs Instruction   Patient understands how to develop strategies to address psychosocial issues. Needs Instruction   Patient understands how to develop strategies to promote health/change behavior. Needs Instruction     Complications   How often do you check your blood sugar? 1-2 times/day   Fasting Blood  glucose range (mg/dL) 180-200   Number of hypoglycemic episodes per month 0   Have you had a dilated eye exam in the past 12 months? Yes   Have you had a dental exam in the past 12 months? Yes   Are you checking your feet? Yes   How many days per week are you checking your feet? 7     Dietary Intake   Breakfast boiled egg   Lunch leftover chili   Dinner grilled pork chop, rice, broccoli with some cheese   Snack (evening) small bowl popcorn   Beverage(s) water     Exercise   Exercise Type Light (walking / raking leaves)   How many days per week to you exercise? 7   How many minutes per day do you exercise? 30   Total minutes per week of exercise 210     Patient Education   Previous Diabetes Education No   Disease state  Definition of diabetes, type 1 and 2, and the diagnosis of diabetes;Factors that contribute to the development of diabetes;Explored patient's options for treatment of their diabetes   Nutrition management  Role of diet in the treatment of diabetes and the relationship between the three main macronutrients and blood glucose level   Physical activity and exercise  Role of exercise on diabetes management, blood pressure control and cardiac health.   Medications Reviewed patients medication for diabetes, action, purpose, timing of dose and side effects.   Monitoring Purpose and frequency of SMBG.;Taught/discussed recording of test results and interpretation of SMBG.;Identified appropriate SMBG and/or A1C goals.   Acute complications Taught treatment of hypoglycemia - the  15 rule.;Discussed and identified patients' treatment of hyperglycemia.   Chronic complications Relationship between chronic complications and blood glucose control     Individualized Goals (developed by patient)   Nutrition General guidelines for healthy choices and portions discussed   Physical Activity Exercise 5-7 days per week   Monitoring  test my blood glucose as discussed   Reducing Risk examine  blood glucose patterns;get labs drawn   Health Coping ask for help with (comment)     Post-Education Assessment   Patient understands the diabetes disease and treatment process. Needs Review   Patient understands incorporating nutritional management into lifestyle. Needs Review   Patient undertands incorporating physical activity into lifestyle. Needs Review   Patient understands using medications safely. Needs Review   Patient understands monitoring blood glucose, interpreting and using results Needs Review   Patient understands prevention, detection, and treatment of acute complications. Needs Review   Patient understands prevention, detection, and treatment of chronic complications. Needs Review   Patient understands how to develop strategies to address psychosocial issues. Needs Review     Outcomes   Expected Outcomes Demonstrated interest in learning. Expect positive outcomes   Future DMSE PRN   Program Status Completed      Individualized Plan for Diabetes Self-Management Training:   Learning Objective:  Patient will have a greater understanding of diabetes self-management. Patient education plan is to attend individual and/or group sessions per assessed needs and concerns.   Plan:   There are no Patient Instructions on file for this visit.  Expected Outcomes:  Demonstrated interest in learning. Expect positive outcomes  Education material provided: Living Well with Diabetes and Meal plan card  If problems or questions, patient to contact team via:  Phone  Future DSME appointment: PRN

## 2016-08-01 ENCOUNTER — Other Ambulatory Visit: Payer: Self-pay | Admitting: Family Medicine

## 2016-08-03 ENCOUNTER — Telehealth: Payer: Self-pay

## 2016-08-03 NOTE — Telephone Encounter (Signed)
Pt saw Brigitte Pulse in November and she didn't need the mycostatin cream or powder at that time.  She said Brigitte Pulse told her to call when she needed a refill.   Now the patient does need a refill on it.  She had called her pharmacy, but we denied it.    Pt also says that Dr Marin Comment had told her that her A1C number was high.   She doesn't see where we have checked this in [facility].  If we have not checked this, can we please put in an order to have this lab done?  Please call her at [phone]

## 2016-08-05 ENCOUNTER — Encounter: Payer: Self-pay | Admitting: Neurology

## 2016-08-05 ENCOUNTER — Other Ambulatory Visit: Payer: Self-pay

## 2016-08-05 ENCOUNTER — Ambulatory Visit (INDEPENDENT_AMBULATORY_CARE_PROVIDER_SITE_OTHER): Payer: Federal, State, Local not specified - PPO | Admitting: Neurology

## 2016-08-05 VITALS — BP 158/89 | HR 65 | Resp 20 | Ht 62.0 in | Wt 215.0 lb

## 2016-08-05 DIAGNOSIS — G4733 Obstructive sleep apnea (adult) (pediatric): Secondary | ICD-10-CM | POA: Diagnosis not present

## 2016-08-05 DIAGNOSIS — Z9989 Dependence on other enabling machines and devices: Secondary | ICD-10-CM | POA: Diagnosis not present

## 2016-08-05 MED ORDER — MELOXICAM 15 MG PO TABS: 15.0000 mg | ORAL_TABLET | Freq: Every day | ORAL | 0 refills | Status: AC | PRN

## 2016-08-05 NOTE — Progress Notes (Signed)
Subjective:    Patient ID: Katherine Sanchez is a 60 y.o. female.  HPI     HPI:    Dear Dr. Brigitte Pulse,   I saw your patient, Katherine Sanchez, upon your kind request in my neurologic clinic today for initial consultation of her sleep disorder, in particular reevaluation of her prior diagnosis of OSA. The patient is unaccompanied today. As you know, Ms. Katherine Sanchez is a 60 year old right-handed woman with an underlying medical history of arthritis, chronic constipation, reflux disease, hyperlipidemia, MVP, hypertension, hypothyroidism, history of pneumonia in 2006, hypothyroidism, and obesity, who was previously diagnosed with obstructive sleep apnea and placed on PAP therapy. Sleep study test results are not available for my review, original diagnosis is probably more than 15 years ago. She has been on CPAP. I reviewed her compliance data from 07/06/2016 through 08/04/2016, which is a total of 30 days, during which time she used her machine every night with percent used days greater than 4 hours of 100%, indicating superb compliance with an average usage of 6 hours and 54 minutes, residual AHI 4.2 per hour, leak acceptable at 11 L/m on a pressure range of 5-15 auto, 95th percentile pressure at 14 cm. Her Epworth sleepiness score is 10 out of 24 today, her fatigue score is 25 out of 63.  I reviewed your office note from 07/02/2016. She had recently had an episode of acute headache, had a CT head at the time and I reviewed the results from her head CT without contrast on 06/12/2016: IMPRESSION: No evidence of acute intracranial abnormality.   Mild chronic small-vessel white matter ischemic changes.   She was treated symptomatically for her headache with Toradol and Fioricet and improved without recurrence of a similar headache. She denies any restless leg symptoms or leg twitching at night or parasomnias. She is generally in bed between 11:30 and midnight and wake up time is around 6:45-7, she usually has  to take the dog out. She lives at home with her husband. 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 She has 2 grown sons area she quit smoking and quit drinking alcohol in 1986. She was never heavy drinker. She drinks one cup of coffee each morning. She has been trying to lose weight. She denies any morning headaches. She has been using a fullface mask with good tolerance. She had some recent stressors in her family and felt more exhausted but things are settling down some now. She was told that she had moderate obstructive sleep apnea when she was originally diagnosed at age 61. This is her second CPAP machine. She has no concerns about using her machine. She indicates full compliance and great results with using CPAP. In fact, she feels that she cannot sleep without it. When she first got diagnosed in her 53s, she had significant daytime somnolence and this improved tremendously when she was started on CPAP therapy.  Her Past Medical History Is Significant For: Past Medical History:  Diagnosis Date  . Abdominal distension   . Arthritis    knees   . Constipation   . Diabetes mellitus without complication ([place])   . GERD (gastroesophageal reflux disease)   . Heart murmur   . Hyperlipidemia   . Hypertension   . Hypothyroidism   . Incisional hernia   . Pneumonia    hx of 2006   . Sleep apnea    cpap x 10 yrs Dr [name]   . Thyroid  disease     Her Past Surgical History Is Significant For: Past Surgical History:  Procedure Laterality Date  . APPENDECTOMY  1982  . CARDIAC CATHETERIZATION     1999 and negative  . CHOLECYSTECTOMY  2010   lap with open redo umbilical hernia repair.  Dr. [name]  . HERNIA REPAIR  1191   umbilical open w mesh plug. Dr. [name] and 2010 Dr [name]   . KNEE SURGERY  2006   left - arthroscopic  . VENTRAL HERNIA REPAIR  08/13/2011   Procedure: LAPAROSCOPIC VENTRAL HERNIA;  Surgeon: Adin Hector, MD;  Location: [facility]  ORS;  Service: General;  Laterality: N/A;  Laparoscopic Ventral Hernia Repair with Mesh    Her Family History Is Significant For: Family History  Problem Relation Age of Onset  . Cancer Mother     colon  . Heart disease Mother 98    congestive heart failure  . COPD Mother   . Other Father 19    car accident  . Cancer Maternal Grandmother     ovarian  . Asthma Son   . Allergies Son   . Hypertension Paternal Grandmother   . Cancer Paternal Grandfather     prostate    Her Social History Is Significant For: Social History   Social History  . Marital status: Married    Spouse name: N/A  . Number of children: N/A  . Years of education: N/A   Occupational History  . 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)    Social History Main Topics  . Smoking status: Former Smoker    Packs/day: 0.10    Years: 4.00    Types: Cigarettes    Quit date: 07/07/1978  . Smokeless tobacco: Never Used     Comment: occassional smoker with alcohol consumption  . Alcohol use No     Comment: quit 1979  . Drug use: No  . Sexual activity: Yes    Birth control/ protection: None     Comment: number of sex partners in the last 28 months 1   Other Topics Concern  . None   Social History Narrative   Lives with husband and his mother is currently living with them after sustaining a fall in Oct. 2013.   Exercise walking 2 times daily    Her Allergies Are:  Allergies  Allergen Reactions  . Amoxil [Amoxicillin]     hives  :   Her Current Medications Are:  Outpatient Encounter Prescriptions as of 08/05/2016  Medication Sig  . amLODipine-benazepril (LOTREL) 10-20 MG capsule Take 1 capsule by mouth daily.  . Ascorbic Acid (VITAMIN C) POWD Take 1,000 mg by mouth daily.   Marland Kitchen aspirin 81 MG tablet Take 81 mg by mouth daily.  . blood glucose meter kit and supplies KIT Dispense based on patient and insurance preference. Use up to one time as directed  (FOR ICD-9 250.00, 250.01).  .  butalbital-acetaminophen-caffeine (FIORICET, ESGIC) 50-325-40 MG tablet Take 1-2 tablets by mouth every 6 (six) hours as needed for headache.  . docusate sodium (COLACE) 100 MG capsule Take 100 mg by mouth 2 (two) times daily as needed.    . fluticasone (FLONASE) 50 MCG/ACT nasal spray PLACE 2 SPRAYS INTO BOTH NOSTRILS EVERY DAY  . ibuprofen (ADVIL,MOTRIN) 200 MG tablet Take 200 mg by mouth every 6 (six) hours as needed.    Marland Kitchen levothyroxine (SYNTHROID, LEVOTHROID) 25 MCG tablet Take 1 tablet (25 mcg total) by mouth daily before breakfast.  . meloxicam (MOBIC) 15 MG tablet  Take 1 tablet (15 mg total) by mouth daily as needed for pain.  . metFORMIN (GLUCOPHAGE) 1000 MG tablet Take 1 tablet (1,000 mg total) by mouth 2 (two) times daily with a meal.  . nystatin (MYCOSTATIN/NYSTOP) 100000 UNIT/GM POWD Apply to affected area BID prn  . nystatin cream (MYCOSTATIN) Apply 1 application topically 2 (two) times daily.  . ONE TOUCH ULTRA TEST test strip USE UP TO 1 TIME AS DIRECTED  . ONETOUCH DELICA LANCETS 34K MISC USE UP TO 1 TIME AS DIRECTED  . [DISCONTINUED] meloxicam (MOBIC) 15 MG tablet Take 1 tablet (15 mg total) by mouth daily as needed for pain.   No facility-administered encounter medications on file as of 08/05/2016.   :  Review of Systems:  Out of a complete 14 point review of systems, all are reviewed and negative with the exception of these symptoms as listed below: Review of Systems  Neurological:       Pt presents today to discuss her cpap machine and the possibility of obtaining a new sleep study. Pt was a former pt of Dr. [name] but since he is no longer practicing, she does not have a sleep doctor. Pt uses [facility] for her DME.   Epworth Sleepiness Scale 0= would never doze 1= slight chance of dozing 2= moderate chance of dozing 3= high chance of dozing  Sitting and reading: 2 Watching TV: 1 Sitting inactive in a public place (ex. Theater or meeting): 1 As a passenger in a car  for an hour without a break: 2 Lying down to rest in the afternoon: 3 Sitting and talking to someone: 0 Sitting quietly after lunch (no alcohol): 1 In a car, while stopped in traffic: 0 Total: 10     Objective:  Neurologic Exam  Physical Exam Physical Examination:   Vitals:   08/05/16 1340  BP: (!) 158/89  Pulse: 65  Resp: 20   General Examination: The patient is a very pleasant 60 y.o. female in no acute distress. She appears well-developed and well-nourished and well groomed.   HEENT: Normocephalic, atraumatic, pupils are equal, round and reactive to light and accommodation. Funduscopic exam is normal with sharp disc margins noted. Extraocular tracking is good without limitation to gaze excursion or nystagmus noted. Normal smooth pursuit is noted. Hearing is grossly intact. Tympanic membranes are clear bilaterally. Face is symmetric with normal facial animation and normal facial sensation. Speech is clear with no dysarthria noted. There is no hypophonia. There is no lip, neck/head, jaw or voice tremor. Neck is supple with full range of passive and active motion. There are no carotid bruits on auscultation. Oropharynx exam reveals: mild mouth dryness, good dental hygiene and moderate airway crowding, due to smaller airway entry and thicker soft palate, tonsils in place, but small. Mallampati is class III. Tongue protrudes centrally and palate elevates symmetrically. Neck size is 15.75 inches. She has a mild overbite.   Chest: Clear to auscultation without wheezing, rhonchi or crackles noted.  Heart: S1+S2+0, regular and mild systolic murmur, no rubs or gallops noted.   Abdomen: Soft, non-tender and non-distended with normal bowel sounds appreciated on auscultation.  Extremities: There is no pitting edema in the distal lower extremities bilaterally. Pedal pulses are intact.  Skin: Warm and dry without trophic changes noted. There are no varicose veins.  Musculoskeletal: exam  reveals no obvious joint deformities, tenderness or joint swelling or erythema.   Neurologically:  Mental status: The patient is awake, alert and oriented in all 4  spheres. Her immediate and remote memory, attention, language skills and fund of knowledge are appropriate. There is no evidence of aphasia, agnosia, apraxia or anomia. Speech is clear with normal prosody and enunciation. Thought process is linear. Mood is normal and affect is normal.  Cranial nerves II - XII are as described above under HEENT exam. In addition: shoulder shrug is normal with equal shoulder height noted. Motor exam: Normal bulk, strength and tone is noted. There is no drift, tremor or rebound. Romberg is negative. Reflexes are 2+ throughout. Babinski: Toes are flexor bilaterally. Fine motor skills and coordination: intact with normal finger taps, normal hand movements, normal rapid alternating patting, normal foot taps and normal foot agility.  Cerebellar testing: No dysmetria or intention tremor on finger to nose testing. Heel to shin is unremarkable bilaterally. There is no truncal or gait ataxia.  Sensory exam: intact to light touch, pinprick, vibration, temperature sense in the upper and lower extremities.  Gait, station and balance: She stands easily. No veering to one side is noted. No leaning to one side is noted. Posture is age-appropriate and stance is narrow based. Gait shows normal stride length and normal pace. No problems turning are noted. Tandem walk is unremarkable.  Assessment and Plan:  In summary, LUCERO AUZENNE is a very pleasant 60 y.o.-year old female ith an underlying medical history of arthritis, chronic constipation, reflux disease, hyperlipidemia, MVP, hypertension, hypothyroidism, history of pneumonia in 2006, hypothyroidism, and obesity, who was previously diagnosed with obstructive sleep apnea (OSA) and placed on PAP therapy. She has been compliant with CPAP. She is currently fully compliant with  her AutoPap. Her pressure settings are adequate. Her leak is adequate, typically on the lower side, and she has good tolerance of her small full face mask. She is working on weight loss. She is commended for her treatment adherence and healthy lifestyle endeavors. She is encouraged to continue with treatment at the current settings. She did not need any new supplies at this time. Physical exam and neurological exam are nonfocal. Blood pressure was a little borderline today. I encouraged the patient to eat healthy, exercise daily and keep well hydrated, to keep a scheduled bedtime and wake time routine, to not skip any meals and to not skip AutoPAP.  I explained the importance of being compliant with PAP treatment, not only for insurance purposes but primarily to improve Her symptoms, and for the patient's long term health benefit, including to reduce Her cardiovascular risks. I would like to see her back in 6 months, sooner as needed. I answered all her questions today and she was in agreement.   Thank you very much for allowing me to participate in the care of this nice patient. If I can be of any further assistance to you please do not hesitate to call me at [phone].  Sincerely,   Star Age, MD, PhD

## 2016-08-05 NOTE — Patient Instructions (Addendum)
Please continue using your CPAP regularly. While your insurance requires that you use CPAP at least 4 hours each night on 70% of the nights, I recommend, that you not skip any nights and use it throughout the night if you can. Getting used to CPAP and staying with the treatment long term does take time and patience and discipline. Untreated obstructive sleep apnea when it is moderate to severe can have an adverse impact on cardiovascular health and raise her risk for heart disease, arrhythmias, hypertension, congestive heart failure, stroke and diabetes. Untreated obstructive sleep apnea causes sleep disruption, nonrestorative sleep, and sleep deprivation. This can have an impact on your day to day functioning and cause daytime sleepiness and impairment of cognitive function, memory loss, mood disturbance, and problems focussing. Using CPAP regularly can improve these symptoms.  Keep up the good work! I will see you back in 6 months for sleep apnea check up, and if you continue to do well on CPAP I will see you once a year thereafter. Try to work on weight loss.

## 2016-08-05 NOTE — Telephone Encounter (Signed)
Dr. Brigitte Pulse, please review phone message 12/11 re: nystatin refill request.  She saw you for annual 07/02/2016  I will MyChart message to pt that her A1C was in 05/2016 and result was 6.3

## 2016-08-06 ENCOUNTER — Other Ambulatory Visit: Payer: Self-pay

## 2016-08-07 MED ORDER — NYSTATIN 100000 UNIT/GM EX POWD: CUTANEOUS | 1 refills | Status: DC

## 2016-08-07 MED ORDER — NYSTATIN 100000 UNIT/GM EX CREA
1.0000 | TOPICAL_CREAM | Freq: Two times a day (BID) | CUTANEOUS | 1 refills | Status: DC
Start: 2016-08-07 — End: 2018-02-22

## 2016-08-10 NOTE — Telephone Encounter (Signed)
Sent staff message to Ms. Watson with a1c of 6.3 on 10/20. Nystatin cream and powder where refilled for pt on 08/07/16.

## 2016-08-10 NOTE — Telephone Encounter (Signed)
Patient was called and given her A1C results back from October and how to find it.  Patient wants [place] to forward that result to Lynden Ang who works with [facility].

## 2016-08-20 ENCOUNTER — Ambulatory Visit: Payer: Federal, State, Local not specified - PPO | Admitting: Podiatry

## 2016-08-28 ENCOUNTER — Ambulatory Visit (INDEPENDENT_AMBULATORY_CARE_PROVIDER_SITE_OTHER): Payer: Self-pay | Admitting: Podiatry

## 2016-08-28 ENCOUNTER — Encounter: Payer: Self-pay | Admitting: Podiatry

## 2016-08-28 DIAGNOSIS — M2142 Flat foot [pes planus] (acquired), left foot: Secondary | ICD-10-CM

## 2016-08-28 DIAGNOSIS — M722 Plantar fascial fibromatosis: Secondary | ICD-10-CM

## 2016-08-28 DIAGNOSIS — M2141 Flat foot [pes planus] (acquired), right foot: Secondary | ICD-10-CM

## 2016-08-28 DIAGNOSIS — M19079 Primary osteoarthritis, unspecified ankle and foot: Secondary | ICD-10-CM

## 2016-08-28 NOTE — Patient Instructions (Signed)

## 2016-08-28 NOTE — Progress Notes (Signed)
Patient presents to pick up orthotics today. Dispensed orthotics with wearing instructions. Follow up in 4 weeks. She was seen today by Cranford Mon, CMA. She declined an appointment with myself.

## 2016-09-03 ENCOUNTER — Ambulatory Visit (AMBULATORY_SURGERY_CENTER): Payer: Self-pay | Admitting: *Deleted

## 2016-09-03 VITALS — Ht 62.0 in | Wt 217.0 lb

## 2016-09-03 DIAGNOSIS — Z1211 Encounter for screening for malignant neoplasm of colon: Secondary | ICD-10-CM

## 2016-09-03 MED ORDER — NA SULFATE-K SULFATE-MG SULF 17.5-3.13-1.6 GM/177ML PO SOLN: ORAL | 0 refills | Status: DC

## 2016-09-03 NOTE — Progress Notes (Signed)
Pt denies allergies to eggs or soy products. Denies difficulty with sedation or anesthesia. Denies any diet or weight loss medications. Denies use of supplemental oxygen.  Emmi instructions given for procedure.  

## 2016-09-17 ENCOUNTER — Encounter: Payer: Federal, State, Local not specified - PPO | Admitting: Gastroenterology

## 2016-09-25 ENCOUNTER — Other Ambulatory Visit: Payer: Self-pay | Admitting: Family Medicine

## 2016-09-28 ENCOUNTER — Encounter: Payer: Federal, State, Local not specified - PPO | Admitting: Gastroenterology

## 2016-09-30 ENCOUNTER — Other Ambulatory Visit: Payer: Self-pay | Admitting: *Deleted

## 2016-09-30 DIAGNOSIS — R7309 Other abnormal glucose: Secondary | ICD-10-CM

## 2016-09-30 MED ORDER — GLUCOSE BLOOD VI STRP: ORAL_STRIP | 0 refills | Status: DC

## 2016-10-02 ENCOUNTER — Other Ambulatory Visit: Payer: Self-pay

## 2016-10-02 MED ORDER — FLUTICASONE PROPIONATE 50 MCG/ACT NA SUSP: NASAL | 1 refills | Status: DC

## 2016-10-02 NOTE — Telephone Encounter (Signed)
Fax req [facility] [address] for Fluticasone spray Pt seen 06/2016.  Sent

## 2016-10-14 DIAGNOSIS — J00 Acute nasopharyngitis [common cold]: Secondary | ICD-10-CM | POA: Diagnosis not present

## 2016-10-14 DIAGNOSIS — J029 Acute pharyngitis, unspecified: Secondary | ICD-10-CM | POA: Diagnosis not present

## 2016-10-14 DIAGNOSIS — J111 Influenza due to unidentified influenza virus with other respiratory manifestations: Secondary | ICD-10-CM | POA: Diagnosis not present

## 2016-10-14 DIAGNOSIS — Z9289 Personal history of other medical treatment: Secondary | ICD-10-CM | POA: Diagnosis not present

## 2016-11-03 DIAGNOSIS — K080 Exfoliation of teeth due to systemic causes: Secondary | ICD-10-CM | POA: Diagnosis not present

## 2016-11-18 ENCOUNTER — Ambulatory Visit (AMBULATORY_SURGERY_CENTER): Payer: Federal, State, Local not specified - PPO | Admitting: Gastroenterology

## 2016-11-18 ENCOUNTER — Encounter: Payer: Self-pay | Admitting: Gastroenterology

## 2016-11-18 VITALS — BP 112/72 | HR 61 | Temp 99.3°F | Resp 18 | Ht 62.0 in | Wt 217.0 lb

## 2016-11-18 DIAGNOSIS — D125 Benign neoplasm of sigmoid colon: Secondary | ICD-10-CM

## 2016-11-18 DIAGNOSIS — Z1211 Encounter for screening for malignant neoplasm of colon: Secondary | ICD-10-CM

## 2016-11-18 DIAGNOSIS — Z800 Family history of malignant neoplasm of digestive organs: Secondary | ICD-10-CM | POA: Diagnosis not present

## 2016-11-18 DIAGNOSIS — D120 Benign neoplasm of cecum: Secondary | ICD-10-CM | POA: Diagnosis not present

## 2016-11-18 DIAGNOSIS — D122 Benign neoplasm of ascending colon: Secondary | ICD-10-CM

## 2016-11-18 DIAGNOSIS — Z1212 Encounter for screening for malignant neoplasm of rectum: Secondary | ICD-10-CM | POA: Diagnosis not present

## 2016-11-18 HISTORY — PX: COLONOSCOPY: SHX174

## 2016-11-18 MED ORDER — SODIUM CHLORIDE 0.9 % IV SOLN: 500.0000 mL | INTRAVENOUS | Status: DC

## 2016-11-18 NOTE — Progress Notes (Signed)
Report to PACU, RN, vss, BBS= Clear.  

## 2016-11-18 NOTE — Progress Notes (Signed)
Called to room to assist during endoscopic procedure.  Patient ID and intended procedure confirmed with present staff. Received instructions for my participation in the procedure from the performing physician.  

## 2016-11-18 NOTE — Op Note (Signed)
[place] Patient Name: Katherine Sanchez Procedure Date: 11/18/2016 8:31 AM MRN: [id number] Endoscopist: Remo Lipps P. Armbruster MD, MD Age: 61 Referring MD:  Date of Birth: Feb 12, 1956 Gender: Female Account #: [id number] Procedure:                Colonoscopy Indications:              Screening patient at increased risk: Family history                            of 1st-degree relative with colorectal cancer                            (mother diagnosed age 24s), This is the patient's                            first colonoscopy Medicines:                Monitored Anesthesia Care Procedure:                Pre-Anesthesia Assessment:                           - Prior to the procedure, a History and Physical                            was performed, and patient medications and                            allergies were reviewed. The patient's tolerance of                            previous anesthesia was also reviewed. The risks                            and benefits of the procedure and the sedation                            options and risks were discussed with the patient.                            All questions were answered, and informed consent                            was obtained. Prior Anticoagulants: The patient has                            taken aspirin, last dose was 1 day prior to                            procedure. ASA Grade Assessment: II - A patient                            with mild systemic disease. After reviewing the  risks and benefits, the patient was deemed in                            satisfactory condition to undergo the procedure.                           After obtaining informed consent, the colonoscope                            was passed under direct vision. Throughout the                            procedure, the patient's blood pressure, pulse, and                            oxygen saturations were monitored  continuously. The                            Colonoscope was introduced through the anus and                            advanced to the the cecum, identified by                            appendiceal orifice and ileocecal valve. The                            colonoscopy was performed without difficulty. The                            patient tolerated the procedure well. The quality                            of the bowel preparation was good. The ileocecal                            valve, appendiceal orifice, and rectum were                            photographed. Scope In: 9:01:26 AM Scope Out: 9:28:58 AM Scope Withdrawal Time: 0 hours 21 minutes 59 seconds  Total Procedure Duration: 0 hours 27 minutes 32 seconds  Findings:                 The perianal and digital rectal examinations were                            normal.                           Two sessile polyps were found in the cecum. The                            polyps were 4 to 5 mm in size. These polyps were  removed with a cold snare. Resection and retrieval                            were complete.                           Five sessile polyps were found in the ascending                            colon. The polyps were 4 to 6 mm in size. These                            polyps were removed with a cold snare. Resection                            and retrieval were complete.                           Two sessile polyps were found in the sigmoid colon.                            The polyps were 5 to 6 mm in size. These polyps                            were removed with a cold snare. Resection and                            retrieval were complete.                           Multiple medium-mouthed diverticula were found in                            the left colon.                           Internal hemorrhoids were found during retroflexion.                           The colon was tortous. The  exam was otherwise                            without abnormality. Complications:            No immediate complications. Estimated blood loss:                            Minimal. Estimated Blood Loss:     Estimated blood loss was minimal. Impression:               - Two 4 to 5 mm polyps in the cecum, removed with a                            cold snare. Resected and retrieved.                           -  Five 4 to 6 mm polyps in the ascending colon,                            removed with a cold snare. Resected and retrieved.                           - Two 5 to 6 mm polyps in the sigmoid colon,                            removed with a cold snare. Resected and retrieved.                           - Diverticulosis in the left colon.                           - Internal hemorrhoids.                           - Tortous colon                           - The examination was otherwise normal. Recommendation:           - Patient has a contact number available for                            emergencies. The signs and symptoms of potential                            delayed complications were discussed with the                            patient. Return to normal activities tomorrow.                            Written discharge instructions were provided to the                            patient.                           - Resume previous diet.                           - Continue present medications.                           - No ibuprofen, naproxen, or other non-steroidal                            anti-inflammatory drugs for 2 weeks after polyp                            removal.                           -  Await pathology results.                           - Repeat colonoscopy is recommended for                            surveillance. The colonoscopy date will be                            determined after pathology results from today's                            exam become available  for review. Remo Lipps P. Armbruster MD, MD 11/18/2016 9:34:31 AM This report has been signed electronically.

## 2016-11-18 NOTE — Patient Instructions (Signed)
YOU HAD AN ENDOSCOPIC PROCEDURE TODAY AT [facility]:   Refer to the procedure report that was given to you for any specific questions about what was found during the examination.  If the procedure report does not answer your questions, please call your gastroenterologist to clarify.  If you requested that your care partner not be given the details of your procedure findings, then the procedure report has been included in a sealed envelope for you to review at your convenience later.  YOU SHOULD EXPECT: Some feelings of bloating in the abdomen. Passage of more gas than usual.  Walking can help get rid of the air that was put into your GI tract during the procedure and reduce the bloating. If you had a lower endoscopy (such as a colonoscopy or flexible sigmoidoscopy) you may notice spotting of blood in your stool or on the toilet paper. If you underwent a bowel prep for your procedure, you may not have a normal bowel movement for a few days.  Please Note:  You might notice some irritation and congestion in your nose or some drainage.  This is from the oxygen used during your procedure.  There is no need for concern and it should clear up in a day or so.  SYMPTOMS TO REPORT IMMEDIATELY:   Following lower endoscopy (colonoscopy or flexible sigmoidoscopy):  Excessive amounts of blood in the stool  Significant tenderness or worsening of abdominal pains  Swelling of the abdomen that is new, acute  Fever of 100F or higher   For urgent or emergent issues, a gastroenterologist can be reached at any hour by calling [phone].   DIET:  We do recommend a small meal at first, but then you may proceed to your regular diet.  Drink plenty of fluids but you should avoid alcoholic beverages for 24 hours.  ACTIVITY:  You should plan to take it easy for the rest of today and you should NOT DRIVE or use heavy machinery until tomorrow (because of the sedation medicines used during the test).     FOLLOW UP: Our staff will call the number listed on your records the next business day following your procedure to check on you and address any questions or concerns that you may have regarding the information given to you following your procedure. If we do not reach you, we will leave a message.  However, if you are feeling well and you are not experiencing any problems, there is no need to return our call.  We will assume that you have returned to your regular daily activities without incident.  If any biopsies were taken you will be contacted by phone or by letter within the next 1-3 weeks.  Please call us at [phone] if you have not heard about the biopsies in 3 weeks.    SIGNATURES/CONFIDENTIALITY: You and/or your care partner have signed paperwork which will be entered into your electronic medical record.  These signatures attest to the fact that that the information above on your After Visit Summary has been reviewed and is understood.  Full responsibility of the confidentiality of this discharge information lies with you and/or your care-partner.  Polyp, diverticulosis, hemorrhoid information, high fiber diet information given.  No ibuprofen, naproxen, or other non-steroidal anti inflammatory meds for 2 weeks.

## 2016-11-19 ENCOUNTER — Telehealth: Payer: Self-pay | Admitting: *Deleted

## 2016-11-19 NOTE — Telephone Encounter (Signed)
  Follow up Call-  Call back number 11/18/2016  Post procedure Call Back phone  # [phone]  Permission to leave phone message Yes  Some recent data might be hidden    [facility]

## 2016-11-19 NOTE — Telephone Encounter (Signed)
  Follow up Call-  Call back number 11/18/2016  Post procedure Call Back phone  # [phone]  Permission to leave phone message Yes  Some recent data might be hidden     Patient questions:  Do you have a fever, pain , or abdominal swelling? No. Pain Score  0 *  Have you tolerated food without any problems? Yes.    Have you been able to return to your normal activities? Yes.    Do you have any questions about your discharge instructions: Diet   No. Medications  No. Follow up visit  No.  Do you have questions or concerns about your Care? No.  Actions: * If pain score is 4 or above: No action needed, pain <4.

## 2016-11-25 ENCOUNTER — Encounter: Payer: Self-pay | Admitting: Gastroenterology

## 2016-11-25 DIAGNOSIS — K080 Exfoliation of teeth due to systemic causes: Secondary | ICD-10-CM | POA: Diagnosis not present

## 2016-12-31 ENCOUNTER — Ambulatory Visit (INDEPENDENT_AMBULATORY_CARE_PROVIDER_SITE_OTHER): Payer: Federal, State, Local not specified - PPO | Admitting: Family Medicine

## 2016-12-31 ENCOUNTER — Encounter: Payer: Self-pay | Admitting: Family Medicine

## 2016-12-31 VITALS — BP 131/85 | HR 65 | Temp 98.0°F | Resp 18 | Ht 62.21 in | Wt 220.0 lb

## 2016-12-31 DIAGNOSIS — I10 Essential (primary) hypertension: Secondary | ICD-10-CM | POA: Diagnosis not present

## 2016-12-31 DIAGNOSIS — Z794 Long term (current) use of insulin: Secondary | ICD-10-CM | POA: Diagnosis not present

## 2016-12-31 DIAGNOSIS — E039 Hypothyroidism, unspecified: Secondary | ICD-10-CM

## 2016-12-31 DIAGNOSIS — G4733 Obstructive sleep apnea (adult) (pediatric): Secondary | ICD-10-CM | POA: Diagnosis not present

## 2016-12-31 DIAGNOSIS — Z6841 Body Mass Index (BMI) 40.0 and over, adult: Secondary | ICD-10-CM

## 2016-12-31 DIAGNOSIS — Z5181 Encounter for therapeutic drug level monitoring: Secondary | ICD-10-CM

## 2016-12-31 DIAGNOSIS — E038 Other specified hypothyroidism: Secondary | ICD-10-CM

## 2016-12-31 DIAGNOSIS — Z9989 Dependence on other enabling machines and devices: Secondary | ICD-10-CM | POA: Diagnosis not present

## 2016-12-31 DIAGNOSIS — E119 Type 2 diabetes mellitus without complications: Secondary | ICD-10-CM

## 2016-12-31 DIAGNOSIS — L989 Disorder of the skin and subcutaneous tissue, unspecified: Secondary | ICD-10-CM

## 2016-12-31 LAB — COMPREHENSIVE METABOLIC PANEL
ALBUMIN: 4.2 g/dL (ref 3.6–4.8)
ALT: 35 IU/L — ABNORMAL HIGH (ref 0–32)
AST: 27 IU/L (ref 0–40)
Albumin/Globulin Ratio: 1.4 (ref 1.2–2.2)
Alkaline Phosphatase: 89 IU/L (ref 39–117)
BILIRUBIN TOTAL: 0.3 mg/dL (ref 0.0–1.2)
BUN / CREAT RATIO: 22 (ref 12–28)
BUN: 13 mg/dL (ref 8–27)
CALCIUM: 9.4 mg/dL (ref 8.7–10.3)
CO2: 20 mmol/L (ref 18–29)
CREATININE: 0.59 mg/dL (ref 0.57–1.00)
Chloride: 103 mmol/L (ref 96–106)
GFR, EST AFRICAN AMERICAN: 115 mL/min/{1.73_m2} (ref >59)
GFR, EST NON AFRICAN AMERICAN: 100 mL/min/{1.73_m2} (ref >59)
GLUCOSE: 125 mg/dL — AB (ref 65–99)
Globulin, Total: 2.9 g/dL (ref 1.5–4.5)
Potassium: 4.5 mmol/L (ref 3.5–5.2)
Sodium: 138 mmol/L (ref 134–144)
TOTAL PROTEIN: 7.1 g/dL (ref 6.0–8.5)

## 2016-12-31 LAB — LIPID PANEL
CHOL/HDL RATIO: 3.4 ratio (ref 0.0–4.4)
Cholesterol, Total: 158 mg/dL (ref 100–199)
HDL: 46 mg/dL (ref >39)
LDL CALC: 86 mg/dL (ref 0–99)
Triglycerides: 132 mg/dL (ref 0–149)
VLDL CHOLESTEROL CAL: 26 mg/dL (ref 5–40)

## 2016-12-31 LAB — POCT GLYCOSYLATED HEMOGLOBIN (HGB A1C): Hemoglobin A1C: 6.6

## 2016-12-31 MED ORDER — METFORMIN HCL ER 500 MG PO TB24: 1000.0000 mg | ORAL_TABLET | Freq: Every day | ORAL | 3 refills | Status: DC

## 2016-12-31 NOTE — Progress Notes (Addendum)
Subjective:    Patient ID: Katherine Sanchez, female    DOB: July 11, 1956, 61 y.o.   MRN: [id number] Chief Complaint  Patient presents with  . Follow-up    6 month     HPI I last saw her 6 mos prior for her AWV   HTN: On amlodipine-benazepril 10-20 qd - she takes it with lunch every day. Checks BP at home and is usu 110-120s/70-80.   HLD: Is fasting Hypothyroid: On levothyroxine 25 mcg taken every morning on an empty stomach OSA: on cpap, saw Dr. [name] but hasn't had a sleep study in >10-15 yrs (since her 74s). Feels like her settings may no longer be correct as she is noting more nighttime awakenings and morning fatigue.  She does have new machine relatively recently. Once in the hosp when she was recovering they put o2 in her cpap and she noticed that she slept much better. DM: Diagnosed 2 yrs prior. Seen annually.  Optho??  a1c this mo was 6.3 much improved from 7.9 last year. Was on metformin 1g bid but would have run out >3 mos prior.   She still has one metformin refill left so has no idea how she has so many left. Never went to DM teaching due to life stressors.  Occ checks her cbgs right after diiner and usu 120s-150s.  Tolerating well. Checking cbgs and in a.m. Often run 150s-160 and 130- 170.  She takes a her metformin with breakfast and supper - she wonders if she if bottoming out overnight.  She felt bad one morning and blood sugars was 179s.   Had a cardiac cath in 1999    Arthralgias: occ knee pain. Has had arthroscopy prior   Maternal GM with ovarian cancer in her 7s. No female cancers in maternal family.  Going to start to [employer]  Itching of a new mole on her right upper cheek. noticed for last sev mos. occ bleeds if she picks at it. Worried it is a skin cancer and requests derm referral.  Past Medical History:  Diagnosis Date  . Abdominal distension   . Arthritis    knees   . Constipation   . Diabetes mellitus without complication ([place])   . GERD  (gastroesophageal reflux disease)   . Heart murmur   . Hyperlipidemia   . Hypertension   . Hypothyroidism   . Incisional hernia   . Pneumonia    hx of 2006   . Sleep apnea    cpap x 10 yrs Dr [name]   . Thyroid disease    Past Surgical History:  Procedure Laterality Date  . APPENDECTOMY  1982  . CARDIAC CATHETERIZATION     1999 and negative  . CHOLECYSTECTOMY  2010   lap with open redo umbilical hernia repair.  Dr. [name]  . HERNIA REPAIR  3474   umbilical open w mesh plug. Dr. [name] and 2010 Dr [name]   . KNEE SURGERY  2006   left - arthroscopic  . VENTRAL HERNIA REPAIR  08/13/2011   Procedure: LAPAROSCOPIC VENTRAL HERNIA;  Surgeon: Adin Hector, MD;  Location: [facility];  Service: General;  Laterality: N/A;  Laparoscopic Ventral Hernia Repair with Mesh   Current Outpatient Prescriptions on File Prior to Visit  Medication Sig Dispense Refill  . Ascorbic Acid (VITAMIN C) POWD Take 1,000 mg by mouth daily.     Marland Kitchen aspirin 81 MG tablet Take 81 mg by mouth daily.    . blood glucose meter  kit and supplies KIT Dispense based on patient and insurance preference. Use up to one time as directed  (FOR ICD-9 250.00, 250.01). 1 each 0  . butalbital-acetaminophen-caffeine (FIORICET, ESGIC) 50-325-40 MG tablet Take 1-2 tablets by mouth every 6 (six) hours as needed for headache. 40 tablet 0  . docusate sodium (COLACE) 100 MG capsule Take 100 mg by mouth 2 (two) times daily as needed.      . fluticasone (FLONASE) 50 MCG/ACT nasal spray PLACE 2 SPRAYS INTO BOTH NOSTRILS EVERY DAY 48 g 1  . glucose blood (ONE TOUCH ULTRA TEST) test strip USE UP TO 1 TIME AS DIRECTED 100 each 0  . ibuprofen (ADVIL,MOTRIN) 200 MG tablet Take 200 mg by mouth every 6 (six) hours as needed.      Marland Kitchen levothyroxine (SYNTHROID, LEVOTHROID) 25 MCG tablet Take 1 tablet (25 mcg total) by mouth daily before breakfast. 90 tablet 3  . meloxicam (MOBIC) 15 MG tablet Take 1 tablet (15 mg total) by mouth daily as needed for  pain. 90 tablet 0  . nystatin cream (MYCOSTATIN) Apply 1 application topically 2 (two) times daily. 60 g 1  . ONETOUCH DELICA LANCETS 88T MISC USE UP TO 1 TIME AS DIRECTED 100 each 0   Current Facility-Administered Medications on File Prior to Visit  Medication Dose Route Frequency Provider Last Rate Last Dose  . 0.9 %  sodium chloride infusion  500 mL Intravenous Continuous Armbruster, Renelda Loma, MD       Allergies  Allergen Reactions  . Amoxil [Amoxicillin]     hives   Family History  Problem Relation Age of Onset  . Cancer Mother        colon  . Heart disease Mother 27       congestive heart failure  . COPD Mother   . Colon cancer Mother 64  . Other Father 47       car accident  . Cancer Maternal Grandmother        ovarian  . Asthma Son   . Allergies Son   . Hypertension Paternal Grandmother   . Cancer Paternal Grandfather        prostate   Social History   Social History  . Marital status: Married    Spouse name: N/A  . Number of children: N/A  . Years of education: N/A   Occupational History  . 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)    Social History Main Topics  . Smoking status: Former Smoker    Packs/day: 0.10    Years: 4.00    Types: Cigarettes    Quit date: 07/07/1978  . Smokeless tobacco: Never Used     Comment: occassional smoker with alcohol consumption  . Alcohol use No     Comment: quit 1979  . Drug use: No  . Sexual activity: Yes    Birth control/ protection: None     Comment: number of sex partners in the last 50 months 1   Other Topics Concern  . None   Social History Narrative   Lives with husband and his mother is currently living with them after sustaining a fall in Oct. 2013.   Exercise walking 2 times daily   Depression screen [facility] 2/9 12/31/2016 07/02/2016 06/12/2016 03/24/2015 02/16/2014  Decreased Interest 0 0 0 0 0  Down, Depressed, Hopeless 0 0 1 0 0  PHQ - 2 Score 0 0 1 0 0     Review of Systems See hpi    Objective:  Physical Exam  Constitutional: She is oriented to person, place, and time. She appears well-developed and well-nourished. No distress.  HENT:  Head: Normocephalic and atraumatic.  Right Ear: External ear normal.  Left Ear: External ear normal.  Eyes: Conjunctivae are normal. No scleral icterus.  Neck: Normal range of motion. Neck supple. No thyromegaly present.  Cardiovascular: Normal rate, regular rhythm, normal heart sounds and intact distal pulses.   Pulmonary/Chest: Effort normal and breath sounds normal. No respiratory distress.  Musculoskeletal: She exhibits no edema.  Lymphadenopathy:    She has no cervical adenopathy.  Neurological: She is alert and oriented to person, place, and time.  Skin: Skin is warm and dry. Lesion (right upper lateral cheek 4 mm round, rough nodule with pink border and hypervascularity though obscured by make-up) noted. She is not diaphoretic. No erythema.  Psychiatric: She has a normal mood and affect. Her behavior is normal.      BP 131/85   Pulse 65   Temp 98 F (36.7 C) (Oral)   Resp 18   Ht 5' 2.21" (1.58 m)   Wt 220 lb (99.8 kg)   SpO2 97%   BMI 39.97 kg/m   Assessment & Plan:  6 mos for AWV  1. Medication monitoring encounter   2. Type 2 diabetes mellitus without complication, with long-term current use of insulin (HCC) - change metformin IR 1g bid to XR 1g bid since pt having morning highs - because her breakfast and dinner are <12 hrs apart, she might not be getting early a.m. coverage with metformin. Alternatively, she could be having o/n hypoglycemic episodes then triggering cbgs spike -pt wakes several times o/n to void so encouraged pt to check her cbgs a few times then.  3. Acquired hypothyroidism - tsh is on the high side with direct thyroid hormone levels in the middle so ok to try to increase levothyroxine from 25 to 05 - recheck in 2-3 mos  4. Skin lesion of face - derm referral to Dr. [place] Crane or Dr Allyson Sabal - atypical nevus on right  face needs eval  5.       HTN - well controlled outside office, cont current regimen with amlodipine-benazepril 10-31m qd 6.      Obesity - Pt very frustrated/disappointed as thinks she has an excellent diet and light-moderate activity level but not able to loose weight - has gained 7 lbs in the past 6 mos. However, now she is no longer caring for her ailing parents she is committed to start exercising at [employer] has a new diet book (Dr. AHazle Nordmannmaybe. .Marland Kitchen. .) she wants to try. If unsuccessful, could consider addition of an GLP[facility]  Orders Placed This Encounter  Procedures  . Comprehensive metabolic panel    Order Specific Question:   Has the patient fasted?    Answer:   Yes  . Lipid panel    Order Specific Question:   Has the patient fasted?    Answer:   Yes  . Thyroid Panel With TSH  . Ambulatory referral to Dermatology    Referral Priority:   Routine    Referral Type:   Consultation    Referral Reason:   Specialty Services Required    Requested Specialty:   Dermatology    Number of Visits Requested:   1  . POCT glycosylated hemoglobin (Hb A1C)    Meds ordered this encounter  Medications  . metFORMIN (GLUCOPHAGE XR) 500  MG 24 hr tablet    Sig: Take 2 tablets (1,000 mg total) by mouth daily with breakfast.    Dispense:  360 tablet    Refill:  3  . amLODipine-benazepril (LOTREL) 10-20 MG capsule    Sig: Take 1 capsule by mouth daily.    Dispense:  90 capsule    Refill:  2     Delman Cheadle, M.D.  Primary Care at [facility], [place] [address] [phone] phone [phone] fax  01/05/17 2:13 PM  Results for orders placed or performed in visit on 12/31/16  Comprehensive metabolic panel  Result Value Ref Range   Glucose 125 (H) 65 - 99 mg/dL   BUN 13 8 - 27 mg/dL   Creatinine, Ser 0.59 0.57 - 1.00 mg/dL   GFR calc non Af Amer 100 >59 mL/min/1.73   GFR calc Af Amer 115 >59  mL/min/1.73   BUN/Creatinine Ratio 22 12 - 28   Sodium 138 134 - 144 mmol/L   Potassium 4.5 3.5 - 5.2 mmol/L   Chloride 103 96 - 106 mmol/L   CO2 20 18 - 29 mmol/L   Calcium 9.4 8.7 - 10.3 mg/dL   Total Protein 7.1 6.0 - 8.5 g/dL   Albumin 4.2 3.6 - 4.8 g/dL   Globulin, Total 2.9 1.5 - 4.5 g/dL   Albumin/Globulin Ratio 1.4 1.2 - 2.2   Bilirubin Total 0.3 0.0 - 1.2 mg/dL   Alkaline Phosphatase 89 39 - 117 IU/L   AST 27 0 - 40 IU/L   ALT 35 (H) 0 - 32 IU/L  Lipid panel  Result Value Ref Range   Cholesterol, Total 158 100 - 199 mg/dL   Triglycerides 132 0 - 149 mg/dL   HDL 46 >39 mg/dL   VLDL Cholesterol Cal 26 5 - 40 mg/dL   LDL Calculated 86 0 - 99 mg/dL   Chol/HDL Ratio 3.4 0.0 - 4.4 ratio  Thyroid Panel With TSH  Result Value Ref Range   TSH 3.940 0.450 - 4.500 uIU/mL   T4, Total 8.3 4.5 - 12.0 ug/dL   T3 Uptake Ratio 26 24 - 39 %   Free Thyroxine Index 2.2 1.2 - 4.9  POCT glycosylated hemoglobin (Hb A1C)  Result Value Ref Range   Hemoglobin A1C 6.6

## 2016-12-31 NOTE — Patient Instructions (Addendum)
IF you received an x-ray today, you will receive an invoice from [facility]. Please contact [facility] at [phone] with questions or concerns regarding your invoice.   IF you received labwork today, you will receive an invoice from [facility]. Please contact [facility] at [phone] with questions or concerns regarding your invoice.   Our billing staff will not be able to assist you with questions regarding bills from these companies.  You will be contacted with the lab results as soon as they are available. The fastest way to get your results is to activate your My Chart account. Instructions are located on the last page of this paperwork. If you have not heard from [place] regarding the results in 2 weeks, please contact this office.      Diabetes Mellitus and Exercise Exercising regularly is important for your overall health, especially when you have diabetes (diabetes mellitus). Exercising is not only about losing weight. It has many health benefits, such as increasing muscle strength and bone density and reducing body fat and stress. This leads to improved fitness, flexibility, and endurance, all of which result in better overall health. Exercise has additional benefits for people with diabetes, including:  Reducing appetite.  Helping to lower and control blood glucose.  Lowering blood pressure.  Helping to control amounts of fatty substances (lipids) in the blood, such as cholesterol and triglycerides.  Helping the body to respond better to insulin (improving insulin sensitivity).  Reducing how much insulin the body needs.  Decreasing the risk for heart disease by:  Lowering cholesterol and triglyceride levels.  Increasing the levels of good cholesterol.  Lowering blood glucose levels. What is my activity plan? Your health care provider or certified diabetes educator can help you make a plan for the type and frequency of exercise (activity plan) that  works for you. Make sure that you:  Do at least 150 minutes of moderate-intensity or vigorous-intensity exercise each week. This could be brisk walking, biking, or water aerobics.  Do stretching and strength exercises, such as yoga or weightlifting, at least 2 times a week.  Spread out your activity over at least 3 days of the week.  Get some form of physical activity every day.  Do not go more than 2 days in a row without some kind of physical activity.  Avoid being inactive for more than 90 minutes at a time. Take frequent breaks to walk or stretch.  Choose a type of exercise or activity that you enjoy, and set realistic goals.  Start slowly, and gradually increase the intensity of your exercise over time. What do I need to know about managing my diabetes?  Check your blood glucose before and after exercising.  If your blood glucose is higher than 240 mg/dL (13.3 mmol/L) before you exercise, check your urine for ketones. If you have ketones in your urine, do not exercise until your blood glucose returns to normal.  Know the symptoms of low blood glucose (hypoglycemia) and how to treat it. Your risk for hypoglycemia increases during and after exercise. Common symptoms of hypoglycemia can include:  Hunger.  Anxiety.  Sweating and feeling clammy.  Confusion.  Dizziness or feeling light-headed.  Increased heart rate or palpitations.  Blurry vision.  Tingling or numbness around the mouth, lips, or tongue.  Tremors or shakes.  Irritability.  Keep a rapid-acting carbohydrate snack available before, during, and after exercise to help prevent or treat hypoglycemia.  Avoid injecting insulin into areas of the body that  are going to be exercised. For example, avoid injecting insulin into:  The arms, when playing tennis.  The legs, when jogging.  Keep records of your exercise habits. Doing this can help you and your health care provider adjust your diabetes management plan  as needed. Write down:  Food that you eat before and after you exercise.  Blood glucose levels before and after you exercise.  The type and amount of exercise you have done.  When your insulin is expected to peak, if you use insulin. Avoid exercising at times when your insulin is peaking.  When you start a new exercise or activity, work with your health care provider to make sure the activity is safe for you, and to adjust your insulin, medicines, or food intake as needed.  Drink plenty of water while you exercise to prevent dehydration or heat stroke. Drink enough fluid to keep your urine clear or pale yellow. This information is not intended to replace advice given to you by your health care provider. Make sure you discuss any questions you have with your health care provider. Document Released: 10/31/2003 Document Revised: 02/28/2016 Document Reviewed: 01/20/2016 Elsevier Interactive Patient Education  2017 [employer].

## 2017-01-01 LAB — THYROID PANEL WITH TSH
Free Thyroxine Index: 2.2 (ref 1.2–4.9)
T3 Uptake Ratio: 26 % (ref 24–39)
T4, Total: 8.3 ug/dL (ref 4.5–12.0)
TSH: 3.94 u[IU]/mL (ref 0.450–4.500)

## 2017-01-04 ENCOUNTER — Other Ambulatory Visit: Payer: Self-pay | Admitting: Family Medicine

## 2017-01-05 MED ORDER — AMLODIPINE BESY-BENAZEPRIL HCL 10-20 MG PO CAPS: 1.0000 | ORAL_CAPSULE | Freq: Every day | ORAL | 2 refills | Status: DC

## 2017-01-05 MED ORDER — METFORMIN HCL ER 500 MG PO TB24: 1000.0000 mg | ORAL_TABLET | Freq: Two times a day (BID) | ORAL | 3 refills | Status: DC

## 2017-01-05 MED ORDER — LEVOTHYROXINE SODIUM 50 MCG PO TABS: 50.0000 ug | ORAL_TABLET | Freq: Every day | ORAL | 0 refills | Status: DC

## 2017-01-05 NOTE — Addendum Note (Signed)
Addended by: Delman Cheadle on: 01/05/2017 02:38 PM   Modules accepted: Orders

## 2017-01-08 ENCOUNTER — Encounter: Payer: Self-pay | Admitting: Family Medicine

## 2017-02-03 DIAGNOSIS — K080 Exfoliation of teeth due to systemic causes: Secondary | ICD-10-CM | POA: Diagnosis not present

## 2017-02-04 ENCOUNTER — Ambulatory Visit: Payer: Federal, State, Local not specified - PPO | Admitting: Neurology

## 2017-02-18 ENCOUNTER — Other Ambulatory Visit: Payer: Self-pay | Admitting: Family Medicine

## 2017-02-18 DIAGNOSIS — Z1231 Encounter for screening mammogram for malignant neoplasm of breast: Secondary | ICD-10-CM

## 2017-03-16 ENCOUNTER — Ambulatory Visit: Payer: Federal, State, Local not specified - PPO

## 2017-03-17 ENCOUNTER — Ambulatory Visit
Admission: RE | Admit: 2017-03-17 | Discharge: 2017-03-17 | Disposition: A | Discharge status: Home / Self Care | Payer: Federal, State, Local not specified - PPO | Source: Ambulatory Visit | Attending: Family Medicine | Admitting: Family Medicine

## 2017-03-17 DIAGNOSIS — Z1231 Encounter for screening mammogram for malignant neoplasm of breast: Secondary | ICD-10-CM

## 2017-03-23 ENCOUNTER — Telehealth: Payer: Self-pay

## 2017-03-23 NOTE — Telephone Encounter (Signed)
I called pt and reminded her to bring her cpap to her appt with Dr. Rexene Alberts tomorrow. Pt verbalized understanding.

## 2017-03-24 ENCOUNTER — Encounter: Payer: Self-pay | Admitting: Neurology

## 2017-03-24 ENCOUNTER — Ambulatory Visit (INDEPENDENT_AMBULATORY_CARE_PROVIDER_SITE_OTHER): Payer: Federal, State, Local not specified - PPO | Admitting: Neurology

## 2017-03-24 VITALS — BP 137/87 | HR 77 | Ht 62.0 in | Wt 220.0 lb

## 2017-03-24 DIAGNOSIS — Z9989 Dependence on other enabling machines and devices: Secondary | ICD-10-CM | POA: Diagnosis not present

## 2017-03-24 DIAGNOSIS — G4733 Obstructive sleep apnea (adult) (pediatric): Secondary | ICD-10-CM | POA: Diagnosis not present

## 2017-03-24 NOTE — Patient Instructions (Signed)
Please continue using your autoPAP regularly. While your insurance requires that you use PAP at least 4 hours each night on 70% of the nights, I recommend, that you not skip any nights and use it throughout the night if you can. Getting used to PAP and staying with the treatment long term does take time and patience and discipline. Untreated obstructive sleep apnea when it is moderate to severe can have an adverse impact on cardiovascular health and raise her risk for heart disease, arrhythmias, hypertension, congestive heart failure, stroke and diabetes. Untreated obstructive sleep apnea causes sleep disruption, nonrestorative sleep, and sleep deprivation. This can have an impact on your day to day functioning and cause daytime sleepiness and impairment of cognitive function, memory loss, mood disturbance, and problems focussing. Using PAP regularly can improve these symptoms.  We can see you in 1 year, you can see one of our nurse practitioners as you are stable. I will see you after that.      

## 2017-03-24 NOTE — Progress Notes (Signed)
Subjective:    Patient ID: Katherine Sanchez is a 61 y.o. female.  HPI     Interim history:   Katherine Sanchez is a 61 year old right-handed woman with an underlying medical history of arthritis, chronic constipation, reflux disease, hyperlipidemia, MVP, hypertension, hypothyroidism, history of pneumonia in 2006, hypothyroidism, and obesity, who presents for follow-up consultation of her obstructive sleep apnea on AutoPap therapy. The patient is unaccompanied today. I met her on 08/05/2016 at the request of her primary care physician, at which time she reported a prior diagnosis of OSA and being on AutoPap therapy. She was compliant with treatment. She was encouraged to follow-up routinely in 6 months.  Today, 03/24/2017: I reviewed her AutoPap compliance data from 02/22/2017 through 03/23/2014, which is a total of 30 days, during which time she used her machine every night with percent used days greater than 4 hours at 100%, indicating superb compliance with an average usage of 7 hours and 23 minutes, residual AHI at goal at 3.7 per hour, leak acceptable for the 95th percentile at 12.7 L/m and 95th percentile pressure at 14 cm, pressure range of 5-15 with EPR. She reports doing fine with the current settings, has started making some changes to her lifestyle, will be walking more regularly and is changing some of her eating habits. Does report some stress, taking care of her elderly in-laws and also her father.   The patient's allergies, current medications, family history, past medical history, past social history, past surgical history and problem list were reviewed and updated as appropriate.   Previously (copied from previous notes for reference):   08/05/2016: (She) was previously diagnosed with obstructive sleep apnea and placed on PAP therapy. Sleep study test results are not available for my review, original diagnosis is probably more than 15 years ago. She has been on CPAP. I reviewed her  compliance data from 07/06/2016 through 08/04/2016, which is a total of 30 days, during which time she used her machine every night with percent used days greater than 4 hours of 100%, indicating superb compliance with an average usage of 6 hours and 54 minutes, residual AHI 4.2 per hour, leak acceptable at 11 L/m on a pressure range of 5-15 auto, 95th percentile pressure at 14 cm. Her Epworth sleepiness score is 10 out of 24 today, her fatigue score is 25 out of 63.   I reviewed your office note from 07/02/2016. She had recently had an episode of acute headache, had a CT head at the time and I reviewed the results from her head CT without contrast on 06/12/2016: IMPRESSION: No evidence of acute intracranial abnormality.   Mild chronic small-vessel white matter ischemic changes.    She was treated symptomatically for her headache with Toradol and Fioricet and improved without recurrence of a similar headache. She denies any restless leg symptoms or leg twitching at night or parasomnias. She is generally in bed between 11:30 and midnight and wake up time is around 6:45-7, she usually has to take the dog out. She lives at home with her husband. 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 She has 2 grown sons area she quit smoking and quit drinking alcohol in 1986. She was never heavy drinker. She drinks one cup of coffee each morning. She has been trying to lose weight. She denies any morning headaches. She has been using a fullface mask with good tolerance. She had some recent stressors in her family  and felt more exhausted but things are settling down some now. She was told that she had moderate obstructive sleep apnea when she was originally diagnosed at age 23. This is her second CPAP machine. She has no concerns about using her machine. She indicates full compliance and great results with using CPAP. In fact, she feels that she cannot sleep without it. When she  first got diagnosed in her 42s, she had significant daytime somnolence and this improved tremendously when she was started on CPAP therapy.   Her Past Medical History Is Significant For: Past Medical History:  Diagnosis Date  . Abdominal distension   . Arthritis    knees   . Constipation   . Diabetes mellitus without complication ([place])   . GERD (gastroesophageal reflux disease)   . Heart murmur   . Hyperlipidemia   . Hypertension   . Hypothyroidism   . Incisional hernia   . Pneumonia    hx of 2006   . Sleep apnea    cpap x 10 yrs Dr [name]   . Thyroid disease     Her Past Surgical History Is Significant For: Past Surgical History:  Procedure Laterality Date  . APPENDECTOMY  1982  . CARDIAC CATHETERIZATION     1999 and negative  . CHOLECYSTECTOMY  2010   lap with open redo umbilical hernia repair.  Dr. [name]  . HERNIA REPAIR  0254   umbilical open w mesh plug. Dr. [name] and 2010 Dr [name]   . KNEE SURGERY  2006   left - arthroscopic  . VENTRAL HERNIA REPAIR  08/13/2011   Procedure: LAPAROSCOPIC VENTRAL HERNIA;  Surgeon: Adin Hector, MD;  Location: [facility];  Service: General;  Laterality: N/A;  Laparoscopic Ventral Hernia Repair with Mesh    Her Family History Is Significant For: Family History  Problem Relation Age of Onset  . Cancer Mother        colon  . Heart disease Mother 43       congestive heart failure  . COPD Mother   . Colon cancer Mother 88  . Other Father 6       car accident  . Cancer Maternal Grandmother        ovarian  . Asthma Son   . Allergies Son   . Hypertension Paternal Grandmother   . Cancer Paternal Grandfather        prostate  . Breast cancer Neg Hx     Her Social History Is Significant For: Social History   Social History  . Marital status: Married    Spouse name: N/A  . Number of children: N/A  . Years of education: N/A   Occupational History  . 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)    Social History Main Topics  .  Smoking status: Former Smoker    Packs/day: 0.10    Years: 4.00    Types: Cigarettes    Quit date: 07/07/1978  . Smokeless tobacco: Never Used     Comment: occassional smoker with alcohol consumption  . Alcohol use No     Comment: quit 1979  . Drug use: No  . Sexual activity: Yes    Birth control/ protection: None     Comment: number of sex partners in the last 60 months 1   Other Topics Concern  . None   Social History Narrative   Lives with husband and his mother is currently living with them after sustaining a fall in Oct. 2013.   Exercise walking 2  times daily    Her Allergies Are:  Allergies  Allergen Reactions  . Amoxil [Amoxicillin]     hives  :   Her Current Medications Are:  Outpatient Encounter Prescriptions as of 03/24/2017  Medication Sig  . amLODipine-benazepril (LOTREL) 10-20 MG capsule Take 1 capsule by mouth daily.  . Ascorbic Acid (VITAMIN C) POWD Take 1,000 mg by mouth daily.   Marland Kitchen aspirin 81 MG tablet Take 81 mg by mouth daily.  . blood glucose meter kit and supplies KIT Dispense based on patient and insurance preference. Use up to one time as directed  (FOR ICD-9 250.00, 250.01).  . butalbital-acetaminophen-caffeine (FIORICET, ESGIC) 50-325-40 MG tablet Take 1-2 tablets by mouth every 6 (six) hours as needed for headache.  . docusate sodium (COLACE) 100 MG capsule Take 100 mg by mouth 2 (two) times daily as needed.    . fluticasone (FLONASE) 50 MCG/ACT nasal spray PLACE 2 SPRAYS INTO BOTH NOSTRILS EVERY DAY  . glucose blood (ONE TOUCH ULTRA TEST) test strip USE UP TO 1 TIME AS DIRECTED  . ibuprofen (ADVIL,MOTRIN) 200 MG tablet Take 200 mg by mouth every 6 (six) hours as needed.    Marland Kitchen levothyroxine (SYNTHROID, LEVOTHROID) 50 MCG tablet Take 1 tablet (50 mcg total) by mouth daily before breakfast.  . meloxicam (MOBIC) 15 MG tablet Take 1 tablet (15 mg total) by mouth daily as needed for pain.  . metFORMIN (GLUCOPHAGE XR) 500 MG 24 hr tablet Take 2 tablets  (1,000 mg total) by mouth 2 (two) times daily with a meal.  . nystatin cream (MYCOSTATIN) Apply 1 application topically 2 (two) times daily.  Glory Rosebush DELICA LANCETS 16X MISC USE UP TO 1 TIME AS DIRECTED   No facility-administered encounter medications on file as of 03/24/2017.   :  Review of Systems:  Out of a complete 14 point review of systems, all are reviewed and negative with the exception of these symptoms as listed below: Review of Systems  Neurological:       Pt presents today to discuss her cpap. Pt reports that her cpap is going well and she is getting regular supplies from [facility].    Objective:  Neurological Exam  Physical Exam Physical Examination:   Vitals:   03/24/17 0827  BP: 137/87  Pulse: 77    General Examination: The patient is a very pleasant 61 y.o. female in no acute distress. She appears well-developed and well-nourished and well groomed. Good spirits.   HEENT: Normocephalic, atraumatic, pupils are equal, round and reactive to light and accommodation. She has corrective eyeglasses. Extraocular tracking is good without limitation to gaze excursion or nystagmus noted. Normal smooth pursuit is noted. Hearing is grossly intact. Face is symmetric with normal facial animation and normal facial sensation. Speech is clear with no dysarthria noted. There is no hypophonia. There is no lip, neck/head, jaw or voice tremor. Neck is supple with full range of passive and active motion. There are no carotid bruits on auscultation. Oropharynx exam reveals: No significant changes.   Chest: Clear to auscultation without wheezing, rhonchi or crackles noted.  Heart: S1+S2+0, regular and mild systolic murmur, no rubs or gallops noted.   Abdomen: Soft, non-tender and non-distended with normal bowel sounds appreciated on auscultation.  Extremities: There is no pitting edema in the distal lower extremities bilaterally. Pedal pulses are intact.  Skin: Warm and dry without  trophic changes noted. There are no varicose veins.  Musculoskeletal: exam reveals no obvious joint deformities, tenderness or joint  swelling or erythema.   Neurologically:  Mental status: The patient is awake, alert and oriented in all 4 spheres. Her immediate and remote memory, attention, language skills and fund of knowledge are appropriate. There is no evidence of aphasia, agnosia, apraxia or anomia. Speech is clear with normal prosody and enunciation. Thought process is linear. Mood is normal and affect is normal.  Cranial nerves II - XII are as described above under HEENT exam. In addition: shoulder shrug is normal with equal shoulder height noted. Motor exam: Normal bulk, strength and tone is noted. There is no drift, tremor or rebound. Fine motor skills and coordination: Grossly intact.  Sensory exam: intact to light touch in the upper and lower extremities.  Gait, station and balance: She stands easily. No veering to one side is noted. No leaning to one side is noted. Posture is age-appropriate and stance is narrow based. Gait shows normal stride length and normal pace. No problems turning are noted.   Assessment and Plan:  In summary, Katherine Sanchez is a very pleasant 61 year old female ith an underlying medical history of arthritis, chronic constipation, reflux disease, hyperlipidemia, MVP, hypertension, hypothyroidism, history of pneumonia in 2006, hypothyroidism, and obesity, who was previously diagnosed with obstructive sleep apnea (OSA) and placed on PAP therapy. She has been compliant with her autoPAP and is commended for her Treatment adherence, pressure settings are adequate, leak is acceptable and pressure range is also adequate. She is encouraged to continue to work on weight loss and to be fully compliant with her AutoPap. Physical exam is stable. She is encouraged to follow-up in a year, she can see one of our nurse practitioners next time. She has been receiving updated  supplies from her DME company, [facility]. I answered all her questions today and she was in agreement.  I spent 20 minutes in total face-to-face time with the patient, more than 50% of which was spent in counseling and coordination of care, reviewing test results, reviewing medication and discussing or reviewing the diagnosis of OSA, its prognosis and treatment options. Pertinent laboratory and imaging test results that were available during this visit with the patient were reviewed by me and considered in my medical decision making (see chart for details).

## 2017-04-07 ENCOUNTER — Other Ambulatory Visit: Payer: Self-pay | Admitting: Family Medicine

## 2017-04-07 DIAGNOSIS — G4733 Obstructive sleep apnea (adult) (pediatric): Secondary | ICD-10-CM

## 2017-04-07 DIAGNOSIS — Z9989 Dependence on other enabling machines and devices: Secondary | ICD-10-CM

## 2017-04-07 DIAGNOSIS — E038 Other specified hypothyroidism: Secondary | ICD-10-CM

## 2017-04-07 DIAGNOSIS — I10 Essential (primary) hypertension: Secondary | ICD-10-CM

## 2017-04-14 NOTE — Telephone Encounter (Signed)
mychart message sent to patient about making an apt for more refills

## 2017-05-06 ENCOUNTER — Ambulatory Visit: Payer: Federal, State, Local not specified - PPO | Admitting: Family Medicine

## 2017-05-13 ENCOUNTER — Other Ambulatory Visit: Payer: Self-pay | Admitting: Family Medicine

## 2017-05-13 DIAGNOSIS — I10 Essential (primary) hypertension: Secondary | ICD-10-CM

## 2017-05-13 DIAGNOSIS — E038 Other specified hypothyroidism: Secondary | ICD-10-CM

## 2017-05-13 DIAGNOSIS — G4733 Obstructive sleep apnea (adult) (pediatric): Secondary | ICD-10-CM

## 2017-05-13 DIAGNOSIS — Z9989 Dependence on other enabling machines and devices: Secondary | ICD-10-CM

## 2017-05-13 NOTE — Telephone Encounter (Signed)
SS refill req for Levothyroxine Has follow up visit scheduled in October 2018 Refilled.

## 2017-06-03 ENCOUNTER — Ambulatory Visit: Payer: Federal, State, Local not specified - PPO | Admitting: Family Medicine

## 2017-06-04 ENCOUNTER — Ambulatory Visit: Payer: Federal, State, Local not specified - PPO | Admitting: Family Medicine

## 2017-06-07 DIAGNOSIS — K080 Exfoliation of teeth due to systemic causes: Secondary | ICD-10-CM | POA: Diagnosis not present

## 2017-06-22 ENCOUNTER — Encounter: Payer: Self-pay | Admitting: Family Medicine

## 2017-06-22 ENCOUNTER — Ambulatory Visit (INDEPENDENT_AMBULATORY_CARE_PROVIDER_SITE_OTHER): Payer: Federal, State, Local not specified - PPO | Admitting: Family Medicine

## 2017-06-22 VITALS — BP 116/74 | HR 67 | Temp 98.5°F | Resp 16 | Ht 62.0 in | Wt 210.0 lb

## 2017-06-22 DIAGNOSIS — Z794 Long term (current) use of insulin: Secondary | ICD-10-CM | POA: Diagnosis not present

## 2017-06-22 DIAGNOSIS — E669 Obesity, unspecified: Secondary | ICD-10-CM

## 2017-06-22 DIAGNOSIS — E039 Hypothyroidism, unspecified: Secondary | ICD-10-CM | POA: Diagnosis not present

## 2017-06-22 DIAGNOSIS — Z9989 Dependence on other enabling machines and devices: Secondary | ICD-10-CM | POA: Diagnosis not present

## 2017-06-22 DIAGNOSIS — E038 Other specified hypothyroidism: Secondary | ICD-10-CM | POA: Diagnosis not present

## 2017-06-22 DIAGNOSIS — Z5181 Encounter for therapeutic drug level monitoring: Secondary | ICD-10-CM | POA: Diagnosis not present

## 2017-06-22 DIAGNOSIS — E119 Type 2 diabetes mellitus without complications: Secondary | ICD-10-CM

## 2017-06-22 DIAGNOSIS — I10 Essential (primary) hypertension: Secondary | ICD-10-CM

## 2017-06-22 DIAGNOSIS — G4733 Obstructive sleep apnea (adult) (pediatric): Secondary | ICD-10-CM | POA: Diagnosis not present

## 2017-06-22 LAB — POCT GLYCOSYLATED HEMOGLOBIN (HGB A1C): HEMOGLOBIN A1C: 6.2

## 2017-06-22 MED ORDER — EXENATIDE ER 2 MG/0.85ML ~~LOC~~ AUIJ: 2.0000 mg | AUTO-INJECTOR | SUBCUTANEOUS | 5 refills | Status: DC

## 2017-06-22 MED ORDER — LEVOTHYROXINE SODIUM 50 MCG PO TABS: ORAL_TABLET | ORAL | 0 refills | Status: DC

## 2017-06-22 MED ORDER — METFORMIN HCL ER 500 MG PO TB24: 1000.0000 mg | ORAL_TABLET | Freq: Every day | ORAL | 3 refills | Status: DC

## 2017-06-22 NOTE — Patient Instructions (Addendum)
In case we are dropping you to low overnight, change to just taking 2 tabs metformin the morning since you are starting the Bydureon.  If you are getting higher fasting numbers, then restart the evening metformin or change to 1 tab twice a day.    IF you received an x-ray today, you will receive an invoice from [facility]. Please contact [facility] at [phone] with questions or concerns regarding your invoice.   IF you received labwork today, you will receive an invoice from [place]. Please contact [facility] at [phone] with questions or concerns regarding your invoice.   Our billing staff will not be able to assist you with questions regarding bills from these companies.  You will be contacted with the lab results as soon as they are available. The fastest way to get your results is to activate your My Chart account. Instructions are located on the last page of this paperwork. If you have not heard from [place] regarding the results in 2 weeks, please contact this office.      Diabetes Mellitus and Standards of Medical Care Managing diabetes (diabetes mellitus) can be complicated. Your diabetes treatment may be managed by a team of health care providers, including:  A diet and nutrition specialist (registered dietitian).  A nurse.  A certified diabetes educator (CDE).  A diabetes specialist (endocrinologist).  An eye doctor.  A primary care provider.  A dentist.  Your health care providers follow a schedule in order to help you get the best quality of care. The following schedule is a general guideline for your diabetes management plan. Your health care providers may also give you more specific instructions. HbA1c ( hemoglobin A1c) test This test provides information about blood sugar (glucose) control over the previous 2-3 months. It is used to check whether your diabetes management plan needs to be adjusted.  If you are meeting your treatment goals, this  test is done at least 2 times a year.  If you are not meeting treatment goals or if your treatment goals have changed, this test is done 4 times a year.  Blood pressure test  This test is done at every routine medical visit. For most people, the goal is less than 130/80. Ask your health care provider what your goal blood pressure should be. Dental and eye exams  Visit your dentist two times a year.  If you have type 1 diabetes, get an eye exam 3-5 years after you are diagnosed, and then once a year after your first exam. ? If you were diagnosed with type 1 diabetes as a child, get an eye exam when you are age 35 or older and have had diabetes for 3-5 years. After the first exam, you should get an eye exam once a year.  If you have type 2 diabetes, have an eye exam as soon as you are diagnosed, and then once a year after your first exam. Foot care exam  Visual foot exams are done at every routine medical visit. The exams check for cuts, bruises, redness, blisters, sores, or other problems with the feet.  A complete foot exam is done by your health care provider once a year. This exam includes an inspection of the structure and skin of your feet, and a check of the pulses and sensation in your feet. ? Type 1 diabetes: Get your first exam 3-5 years after diagnosis. ? Type 2 diabetes: Get your first exam as soon as you are diagnosed.  Check your feet  every day for cuts, bruises, redness, blisters, or sores. If you have any of these or other problems that are not healing, contact your health care provider. Kidney function test ( urine microalbumin)  This test is done once a year. ? Type 1 diabetes: Get your first test 5 years after diagnosis. ? Type 2 diabetes: Get your first test as soon as you are diagnosed.  If you have chronic kidney disease (CKD), get a serum creatinine and estimated glomerular filtration rate (eGFR) test once a year. Lipid profile (cholesterol, HDL, LDL,  triglycerides)  This test should be done when you are diagnosed with diabetes, and every 5 years after the first test. If you are on medicines to lower your cholesterol, you may need to get this test done every year. ? The goal for LDL is less than 100 mg/dL (5.5 mmol/L). If you are at high risk, the goal is less than 70 mg/dL (3.9 mmol/L). ? The goal for HDL is 40 mg/dL (2.2 mmol/L) for men and 50 mg/dL(2.8 mmol/L) for women. An HDL cholesterol of 60 mg/dL (3.3 mmol/L) or higher gives some protection against heart disease. ? The goal for triglycerides is less than 150 mg/dL (8.3 mmol/L). Immunizations  The yearly flu (influenza) vaccine is recommended for everyone 6 months or older who has diabetes.  The pneumonia (pneumococcal) vaccine is recommended for everyone 2 years or older who has diabetes. If you are 86 or older, you may get the pneumonia vaccine as a series of two separate shots.  The hepatitis B vaccine is recommended for adults shortly after they have been diagnosed with diabetes.  The Tdap (tetanus, diphtheria, and pertussis) vaccine should be given: ? According to normal childhood vaccination schedules, for children. ? Every 10 years, for adults who have diabetes.  The shingles vaccine is recommended for people who have had chicken pox and are 50 years or older. Mental and emotional health  Screening for symptoms of eating disorders, anxiety, and depression is recommended at the time of diagnosis and afterward as needed. If your screening shows that you have symptoms (you have a positive screening result), you may need further evaluation and be referred to a mental health care provider. Diabetes self-management education  Education about how to manage your diabetes is recommended at diagnosis and ongoing as needed. Treatment plan  Your treatment plan will be reviewed at every medical visit. Summary  Managing diabetes (diabetes mellitus) can be complicated. Your diabetes  treatment may be managed by a team of health care providers.  Your health care providers follow a schedule in order to help you get the best quality of care.  Standards of care including having regular physical exams, blood tests, blood pressure monitoring, immunizations, screening tests, and education about how to manage your diabetes.  Your health care providers may also give you more specific instructions based on your individual health. This information is not intended to replace advice given to you by your health care provider. Make sure you discuss any questions you have with your health care provider. Document Released: 06/07/2009 Document Revised: 05/08/2016 Document Reviewed: 05/08/2016 Elsevier Interactive Patient Education  [employer].

## 2017-06-22 NOTE — Progress Notes (Addendum)
Subjective:    Patient ID: Katherine Sanchez, female    DOB: Feb 07, 1956, 61 y.o.   MRN: [id number] Chief Complaint  Patient presents with  . Weight Loss    HPI I last saw her 5 mos prior for med refills.  HDM: Diagnosed 2 yrs prior. Seen annually.  a1c this mo was 6.3 much improved from 7.9 last year. Was on metformin 1g bid but would have run out >3 mos prior.   She still has one metformin refill left so has no idea how she has so many left. Never went to DM teaching due to life stressors.  Occ checks her cbgs right after diiner and usu 120s-150s.  Tolerating well. Checking cbgs and in a.m. Often run 150s-160 and 130- 170.  She takes a her metformin with breakfast and supper - she wonders if she if bottoming out overnight.  She felt bad one morning and blood sugars was 179s.  DMII: Diagnosed .   Lab Results  Component Value Date   HGBA1C 6.6 12/31/2016   HGBA1C 6.3 06/12/2016   HGBA1C 7.9 03/24/2015   CBGs: fasting a.m. ; after meal  ; No hypoglycemic episodes.  Meter type:  Diet:  Exercising:  DM Med Regimen: Prior changes:   eGFR:  Baseline Cr:  Last checked . Microalb: Done . Normal. On acei Lipids:  LDL ,  non-HDL .  Last levels done  - were improving from prior. On statin. Taking asa 81 qd.  Optho: Seen annually by - last exam  Feet: Monofilament exam done . Denies any no problems.  Not seen by podiatry prior.  Immunizations:  Influenza:  Pneumovax-23: done 2017  as lost 10 lbs in the past 3 mos!  Has been walking more for exercise.  She makes sure to fast from 8 pm to 8 am. And is doing protein heavy breakfast and lunch. Is walking outside and going to [employer].  No lows.  CBGs are largely between  Fasting 120s-150s. 2 hrs after breakfast cbgs are better around 100s-120s No trouble tolerating metformin - is taking 2 tabs twice a day. Stools looser.   Did see optho Dec 2017 but not again this year - [facility] [place]. Is taking asa 81 qd.   Did get  flu shot today.   Occ takes a melatonin prn.   HTN: On amlodipine-benazepril 10-20 qd - she takes it with lunch every day. Checks BP at home and is usu 110-120s/70-80.   HLD: Is fasting Hypothyroid: On levothyroxine 50 mcg taken every morning on an empty stomach OSA: on cpap, saw Dr. [name] but hasn't had a sleep study in >10-15 yrs (since her 59s). Feels like her settings may no longer be correct as she is noting more nighttime awakenings and morning fatigue.  She does have new machine relatively recently. Once in the hosp when she was recovering they put o2 in her cpap and she noticed that she slept much better.  Had a cardiac cath in 1999    Arthralgias: occ knee pain. Has had arthroscopy prior   Maternal GM with ovarian cancer in her 49s. No female cancers in maternal family.  Itching of a new mole on her right upper cheek. noticed for last sev mos. occ bleeds if she picks at it. Worried it is a skin cancer and requests derm referral.   H/o gestational DM.         Past Medical History:  Diagnosis Date  . Abdominal  distension   . Arthritis    knees   . Constipation   . Diabetes mellitus without complication ([place])   . GERD (gastroesophageal reflux disease)   . Heart murmur   . Hyperlipidemia   . Hypertension   . Hypothyroidism   . Incisional hernia   . Pneumonia    hx of 2006   . Sleep apnea    cpap x 10 yrs Dr [name]   . Thyroid disease    Past Surgical History:  Procedure Laterality Date  . APPENDECTOMY  1982  . CARDIAC CATHETERIZATION     1999 and negative  . CHOLECYSTECTOMY  2010   lap with open redo umbilical hernia repair.  Dr. [name]  . HERNIA REPAIR  [id number]   umbilical open w mesh plug. Dr. [name] and 2010 Dr [name]   . KNEE SURGERY  2006   left - arthroscopic  . VENTRAL HERNIA REPAIR  08/13/2011   Procedure: LAPAROSCOPIC VENTRAL HERNIA;  Surgeon: Adin Hector, MD;  Location: [facility];  Service: General;  Laterality: N/A;  Laparoscopic Ventral Hernia  Repair with Mesh   Current Outpatient Prescriptions on File Prior to Visit  Medication Sig Dispense Refill  . amLODipine-benazepril (LOTREL) 10-20 MG capsule Take 1 capsule by mouth daily. 90 capsule 2  . Ascorbic Acid (VITAMIN C) POWD Take 1,000 mg by mouth daily.     Marland Kitchen aspirin 81 MG tablet Take 81 mg by mouth daily.    . blood glucose meter kit and supplies KIT Dispense based on patient and insurance preference. Use up to one time as directed  (FOR ICD-9 250.00, 250.01). 1 each 0  . docusate sodium (COLACE) 100 MG capsule Take 100 mg by mouth 2 (two) times daily as needed.      . fluticasone (FLONASE) 50 MCG/ACT nasal spray PLACE 2 SPRAYS INTO BOTH NOSTRILS EVERY DAY 48 g 1  . glucose blood (ONE TOUCH ULTRA TEST) test strip USE UP TO 1 TIME AS DIRECTED 100 each 0  . ibuprofen (ADVIL,MOTRIN) 200 MG tablet Take 200 mg by mouth every 6 (six) hours as needed.      Marland Kitchen levothyroxine (SYNTHROID, LEVOTHROID) 50 MCG tablet TAKE 1 TABLET BY MOUTH EVERY DAY BEFORE BREAKFAST 30 tablet 0  . meloxicam (MOBIC) 15 MG tablet Take 1 tablet (15 mg total) by mouth daily as needed for pain. 90 tablet 0  . metFORMIN (GLUCOPHAGE XR) 500 MG 24 hr tablet Take 2 tablets (1,000 mg total) by mouth 2 (two) times daily with a meal. 360 tablet 3  . nystatin cream (MYCOSTATIN) Apply 1 application topically 2 (two) times daily. 60 g 1  . ONETOUCH DELICA LANCETS 40C MISC USE UP TO 1 TIME AS DIRECTED 100 each 0   No current facility-administered medications on file prior to visit.    Allergies  Allergen Reactions  . Amoxil [Amoxicillin]     hives   Family History  Problem Relation Age of Onset  . Cancer Mother        colon  . Heart disease Mother 64       congestive heart failure  . COPD Mother   . Colon cancer Mother 54  . Other Father 63       car accident  . Cancer Maternal Grandmother        ovarian  . Asthma Son   . Allergies Son   . Hypertension Paternal Grandmother   . Cancer Paternal Grandfather  prostate  . Breast cancer Neg Hx    Social History   Social History  . Marital status: Married    Spouse name: N/A  . Number of children: N/A  . Years of education: N/A   Occupational History  . 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)    Social History Main Topics  . Smoking status: Former Smoker    Packs/day: 0.10    Years: 4.00    Types: Cigarettes    Quit date: 07/07/1978  . Smokeless tobacco: Never Used     Comment: occassional smoker with alcohol consumption  . Alcohol use No     Comment: quit 1979  . Drug use: No  . Sexual activity: Yes    Birth control/ protection: None     Comment: number of sex partners in the last 44 months 1   Other Topics Concern  . None   Social History Narrative   Lives with husband and his mother is currently living with them after sustaining a fall in Oct. 2013.   Exercise walking 2 times daily   Depression screen [facility] 2/9 06/22/2017 12/31/2016 07/02/2016 06/12/2016 03/24/2015  Decreased Interest 0 0 0 0 0  Down, Depressed, Hopeless 0 0 0 1 0  PHQ - 2 Score 0 0 0 1 0     Review of Systems See hpi    Objective:   Physical Exam  Constitutional: She is oriented to person, place, and time. She appears well-developed and well-nourished. No distress.  HENT:  Head: Normocephalic and atraumatic.  Right Ear: External ear normal.  Left Ear: External ear normal.  Eyes: Conjunctivae are normal. No scleral icterus.  Neck: Normal range of motion. Neck supple. No thyromegaly present.  Cardiovascular: Normal rate, regular rhythm, normal heart sounds and intact distal pulses.   Pulmonary/Chest: Effort normal and breath sounds normal. No respiratory distress.  Musculoskeletal: She exhibits no edema.  Lymphadenopathy:    She has no cervical adenopathy.  Neurological: She is alert and oriented to person, place, and time.  Skin: Skin is warm and dry. Lesion (right upper lateral cheek 4 mm round, rough nodule with pink border and hypervascularity though  obscured by make-up) noted. She is not diaphoretic. No erythema.  Psychiatric: She has a normal mood and affect. Her behavior is normal.      BP 116/74   Pulse 67   Temp 98.5 F (36.9 C)   Resp 16   Ht 5' 2" (1.575 m)   Wt 210 lb (95.3 kg)   SpO2 98%   BMI 38.41 kg/m   Assessment & Plan:  6 mos for AWV  1. Type 2 diabetes mellitus without complication, with long-term current use of insulin (HCC)  a1c well controlled 6.2 lost 10 bs in the past 3 months through strenuous dietary efforts but pt continues to struggle w/ weight and a.m. cbgs are quite high. - dont know if she is having a.m. Spikes because she is getting low o/n so needs a bedtime snack or if her body is just having trouble burning off the evening meal. If she wakes up o/n, check cbgs to see if getting to low.   Will start trial of GLP-1 to help with weight loss so decrease metformin XR 1g bid to 1g qam only - start Bydureon qwk. If low, d/c metformin.  2. Essential hypertension, benign   3. Acquired hypothyroidism - tsh right in mid range - 2.5 on levothryoxine 91mg so cont - decreased 1.4 when increased levothyroxine from 25 to  50 so could consider increasing to 75 if needed to in future - will re-evaluate after DM med adjustment  4. Obesity (BMI 30-39.9)   5. OSA (obstructive sleep apnea) - doing great now that started on cpap sev mos ago - followed at [facility] by Dr. Jaynee Eagles.  6. Medication monitoring encounter   7. Other specified hypothyroidism   8. Essential hypertension   9. OSA on CPAP    BYdureon not on insurance formulary - change to trulicity  Orders Placed This Encounter  Procedures  . Comprehensive metabolic panel  . Thyroid Panel With TSH  . Microalbumin / creatinine urine ratio    Standing Status:   Future    Standing Expiration Date:   07/23/2017  . POCT glycosylated hemoglobin (Hb A1C)    Meds ordered this encounter  Medications  . Exenatide ER (BYDUREON BCISE) 2 MG/0.85ML AUIJ    Sig: Inject 2 mg  into the skin once a week.    Dispense:  4 pen    Refill:  5  . levothyroxine (SYNTHROID, LEVOTHROID) 50 MCG tablet    Sig: TAKE 1 TABLET BY MOUTH EVERY DAY BEFORE BREAKFAST    Dispense:  30 tablet    Refill:  0  . metFORMIN (GLUCOPHAGE XR) 500 MG 24 hr tablet    Sig: Take 2 tablets (1,000 mg total) by mouth daily with breakfast.    Dispense:  360 tablet    Refill:  3     Delman Cheadle, M.D.  Primary Care at [facility] [place], [place] [address] [phone] phone [phone] fax  06/22/17 12:32 PM  Results for orders placed or performed in visit on 06/22/17  Comprehensive metabolic panel  Result Value Ref Range   Glucose 98 65 - 99 mg/dL   BUN 11 8 - 27 mg/dL   Creatinine, Ser 0.51 (L) 0.57 - 1.00 mg/dL   GFR calc non Af Amer 104 >59 mL/min/1.73   GFR calc Af Amer 120 >59 mL/min/1.73   BUN/Creatinine Ratio 22 12 - 28   Sodium 140 134 - 144 mmol/L   Potassium 4.9 3.5 - 5.2 mmol/L   Chloride 103 96 - 106 mmol/L   CO2 22 20 - 29 mmol/L   Calcium 9.6 8.7 - 10.3 mg/dL   Total Protein 7.4 6.0 - 8.5 g/dL   Albumin 4.6 3.6 - 4.8 g/dL   Globulin, Total 2.8 1.5 - 4.5 g/dL   Albumin/Globulin Ratio 1.6 1.2 - 2.2   Bilirubin Total 0.3 0.0 - 1.2 mg/dL   Alkaline Phosphatase 90 39 - 117 IU/L   AST 25 0 - 40 IU/L   ALT 30 0 - 32 IU/L  Thyroid Panel With TSH  Result Value Ref Range   TSH 2.460 0.450 - 4.500 uIU/mL   T4, Total 8.6 4.5 - 12.0 ug/dL   T3 Uptake Ratio 23 (L) 24 - 39 %   Free Thyroxine Index 2.0 1.2 - 4.9  POCT glycosylated hemoglobin (Hb A1C)  Result Value Ref Range   Hemoglobin A1C 6.2

## 2017-06-23 LAB — COMPREHENSIVE METABOLIC PANEL
A/G RATIO: 1.6 (ref 1.2–2.2)
ALBUMIN: 4.6 g/dL (ref 3.6–4.8)
ALT: 30 IU/L (ref 0–32)
AST: 25 IU/L (ref 0–40)
Alkaline Phosphatase: 90 IU/L (ref 39–117)
BILIRUBIN TOTAL: 0.3 mg/dL (ref 0.0–1.2)
BUN / CREAT RATIO: 22 (ref 12–28)
BUN: 11 mg/dL (ref 8–27)
CALCIUM: 9.6 mg/dL (ref 8.7–10.3)
CHLORIDE: 103 mmol/L (ref 96–106)
CO2: 22 mmol/L (ref 20–29)
Creatinine, Ser: 0.51 mg/dL — ABNORMAL LOW (ref 0.57–1.00)
GFR, EST AFRICAN AMERICAN: 120 mL/min/{1.73_m2} (ref >59)
GFR, EST NON AFRICAN AMERICAN: 104 mL/min/{1.73_m2} (ref >59)
GLOBULIN, TOTAL: 2.8 g/dL (ref 1.5–4.5)
Glucose: 98 mg/dL (ref 65–99)
POTASSIUM: 4.9 mmol/L (ref 3.5–5.2)
Sodium: 140 mmol/L (ref 134–144)
TOTAL PROTEIN: 7.4 g/dL (ref 6.0–8.5)

## 2017-06-23 LAB — THYROID PANEL WITH TSH
Free Thyroxine Index: 2 (ref 1.2–4.9)
T3 Uptake Ratio: 23 % — ABNORMAL LOW (ref 24–39)
T4, Total: 8.6 ug/dL (ref 4.5–12.0)
TSH: 2.46 u[IU]/mL (ref 0.450–4.500)

## 2017-06-25 ENCOUNTER — Telehealth: Payer: Self-pay | Admitting: Family Medicine

## 2017-06-25 NOTE — Telephone Encounter (Signed)
Pt wants to let dr Brigitte Pulse know that she is going to need either trilicity or victosa and that wed 400am her sugar was 163 and at 810am 170  Then Thursday 145 400am and [place] number [phone]

## 2017-06-28 NOTE — Telephone Encounter (Signed)
Please advise 

## 2017-06-29 MED ORDER — LEVOTHYROXINE SODIUM 50 MCG PO TABS: ORAL_TABLET | ORAL | 1 refills | Status: DC

## 2017-06-29 MED ORDER — DULAGLUTIDE 0.75 MG/0.5ML ~~LOC~~ SOAJ: 0.7500 mg | SUBCUTANEOUS | 3 refills | Status: DC

## 2017-06-29 NOTE — Telephone Encounter (Signed)
Pt advised.

## 2017-06-29 NOTE — Addendum Note (Signed)
Addended by: Shawnee Knapp on: 06/29/2017 03:31 PM   Modules accepted: Orders

## 2017-06-29 NOTE — Telephone Encounter (Signed)
It doesn't sound like she is bottoming out overnight.  If a.m. cbgs stay high, try changing metformin XR to 1g before supper qd rather than qam.  Sent trulicity to pharmacy. Get coupon on Chief of Staff website or drop by office for it.

## 2017-06-30 ENCOUNTER — Other Ambulatory Visit: Payer: Self-pay | Admitting: Family Medicine

## 2017-07-07 ENCOUNTER — Encounter: Payer: Self-pay | Admitting: Family Medicine

## 2017-07-07 ENCOUNTER — Other Ambulatory Visit: Payer: Self-pay

## 2017-07-07 ENCOUNTER — Ambulatory Visit (INDEPENDENT_AMBULATORY_CARE_PROVIDER_SITE_OTHER): Payer: Federal, State, Local not specified - PPO | Admitting: Family Medicine

## 2017-07-07 VITALS — BP 118/73 | HR 68 | Temp 97.4°F | Resp 16 | Ht 62.0 in | Wt 207.2 lb

## 2017-07-07 DIAGNOSIS — I259 Chronic ischemic heart disease, unspecified: Secondary | ICD-10-CM

## 2017-07-07 DIAGNOSIS — I10 Essential (primary) hypertension: Secondary | ICD-10-CM | POA: Diagnosis not present

## 2017-07-07 DIAGNOSIS — Z1321 Encounter for screening for nutritional disorder: Secondary | ICD-10-CM

## 2017-07-07 DIAGNOSIS — E039 Hypothyroidism, unspecified: Secondary | ICD-10-CM

## 2017-07-07 DIAGNOSIS — Z1383 Encounter for screening for respiratory disorder NEC: Secondary | ICD-10-CM | POA: Diagnosis not present

## 2017-07-07 DIAGNOSIS — Z130 Encounter for screening for diseases of the blood and blood-forming organs and certain disorders involving the immune mechanism: Secondary | ICD-10-CM | POA: Diagnosis not present

## 2017-07-07 DIAGNOSIS — E119 Type 2 diabetes mellitus without complications: Secondary | ICD-10-CM

## 2017-07-07 DIAGNOSIS — Z794 Long term (current) use of insulin: Secondary | ICD-10-CM | POA: Diagnosis not present

## 2017-07-07 DIAGNOSIS — M170 Bilateral primary osteoarthritis of knee: Secondary | ICD-10-CM

## 2017-07-07 DIAGNOSIS — Z0000 Encounter for general adult medical examination without abnormal findings: Secondary | ICD-10-CM

## 2017-07-07 DIAGNOSIS — Z136 Encounter for screening for cardiovascular disorders: Secondary | ICD-10-CM

## 2017-07-07 DIAGNOSIS — Z6838 Body mass index (BMI) 38.0-38.9, adult: Secondary | ICD-10-CM

## 2017-07-07 DIAGNOSIS — Z1389 Encounter for screening for other disorder: Secondary | ICD-10-CM

## 2017-07-07 DIAGNOSIS — Z1231 Encounter for screening mammogram for malignant neoplasm of breast: Secondary | ICD-10-CM

## 2017-07-07 LAB — POCT URINALYSIS DIP (MANUAL ENTRY)
BILIRUBIN UA: NEGATIVE
BILIRUBIN UA: NEGATIVE mg/dL
Glucose, UA: NEGATIVE mg/dL
Leukocytes, UA: NEGATIVE
Nitrite, UA: NEGATIVE
PH UA: 5.5 (ref 5.0–8.0)
PROTEIN UA: NEGATIVE mg/dL
Urobilinogen, UA: 0.2 E.U./dL

## 2017-07-07 NOTE — Patient Instructions (Addendum)
   IF you received an x-ray today, you will receive an invoice from [facility]. Please contact [facility] at [phone] with questions or concerns regarding your invoice.   IF you received labwork today, you will receive an invoice from [facility]. Please contact [facility] at [phone] with questions or concerns regarding your invoice.   Our billing staff will not be able to assist you with questions regarding bills from these companies.  You will be contacted with the lab results as soon as they are available. The fastest way to get your results is to activate your My Chart account. Instructions are located on the last page of this paperwork. If you have not heard from us regarding the results in 2 weeks, please contact this office.     Health Maintenance for Postmenopausal Women Menopause is a normal process in which your reproductive ability comes to an end. This process happens gradually over a span of months to years, usually between the ages of 48 and 55. Menopause is complete when you have missed 12 consecutive menstrual periods. It is important to talk with your health care provider about some of the most common conditions that affect postmenopausal women, such as heart disease, cancer, and bone loss (osteoporosis). Adopting a healthy lifestyle and getting preventive care can help to promote your health and wellness. Those actions can also lower your chances of developing some of these common conditions. What should I know about menopause? During menopause, you may experience a number of symptoms, such as:  Moderate-to-severe hot flashes.  Night sweats.  Decrease in sex drive.  Mood swings.  Headaches.  Tiredness.  Irritability.  Memory problems.  Insomnia.  Choosing to treat or not to treat menopausal changes is an individual decision that you make with your health care provider. What should I know about hormone replacement therapy and  supplements? Hormone therapy products are effective for treating symptoms that are associated with menopause, such as hot flashes and night sweats. Hormone replacement carries certain risks, especially as you become older. If you are thinking about using estrogen or estrogen with progestin treatments, discuss the benefits and risks with your health care provider. What should I know about heart disease and stroke? Heart disease, heart attack, and stroke become more likely as you age. This may be due, in part, to the hormonal changes that your body experiences during menopause. These can affect how your body processes dietary fats, triglycerides, and cholesterol. Heart attack and stroke are both medical emergencies. There are many things that you can do to help prevent heart disease and stroke:  Have your blood pressure checked at least every 1-2 years. High blood pressure causes heart disease and increases the risk of stroke.  If you are 55-79 years old, ask your health care provider if you should take aspirin to prevent a heart attack or a stroke.  Do not use any tobacco products, including cigarettes, chewing tobacco, or electronic cigarettes. If you need help quitting, ask your health care provider.  It is important to eat a healthy diet and maintain a healthy weight. ? Be sure to include plenty of vegetables, fruits, low-fat dairy products, and lean protein. ? Avoid eating foods that are high in solid fats, added sugars, or salt (sodium).  Get regular exercise. This is one of the most important things that you can do for your health. ? Try to exercise for at least 150 minutes each week. The type of exercise that you do should increase your   heart rate and make you sweat. This is known as moderate-intensity exercise. ? Try to do strengthening exercises at least twice each week. Do these in addition to the moderate-intensity exercise.  Know your numbers.Ask your health care provider to check  your cholesterol and your blood glucose. Continue to have your blood tested as directed by your health care provider.  What should I know about cancer screening? There are several types of cancer. Take the following steps to reduce your risk and to catch any cancer development as early as possible. Breast Cancer  Practice breast self-awareness. ? This means understanding how your breasts normally appear and feel. ? It also means doing regular breast self-exams. Let your health care provider know about any changes, no matter how small.  If you are 40 or older, have a clinician do a breast exam (clinical breast exam or CBE) every year. Depending on your age, family history, and medical history, it may be recommended that you also have a yearly breast X-ray (mammogram).  If you have a family history of breast cancer, talk with your health care provider about genetic screening.  If you are at high risk for breast cancer, talk with your health care provider about having an MRI and a mammogram every year.  Breast cancer (BRCA) gene test is recommended for women who have family members with BRCA-related cancers. Results of the assessment will determine the need for genetic counseling and BRCA1 and for BRCA2 testing. BRCA-related cancers include these types: ? Breast. This occurs in males or females. ? Ovarian. ? Tubal. This may also be called fallopian tube cancer. ? Cancer of the abdominal or pelvic lining (peritoneal cancer). ? Prostate. ? Pancreatic.  Cervical, Uterine, and Ovarian Cancer Your health care provider may recommend that you be screened regularly for cancer of the pelvic organs. These include your ovaries, uterus, and vagina. This screening involves a pelvic exam, which includes checking for microscopic changes to the surface of your cervix (Pap test).  For women ages 21-65, health care providers may recommend a pelvic exam and a Pap test every three years. For women ages 30-65,  they may recommend the Pap test and pelvic exam, combined with testing for human papilloma virus (HPV), every five years. Some types of HPV increase your risk of cervical cancer. Testing for HPV may also be done on women of any age who have unclear Pap test results.  Other health care providers may not recommend any screening for nonpregnant women who are considered low risk for pelvic cancer and have no symptoms. Ask your health care provider if a screening pelvic exam is right for you.  If you have had past treatment for cervical cancer or a condition that could lead to cancer, you need Pap tests and screening for cancer for at least 20 years after your treatment. If Pap tests have been discontinued for you, your risk factors (such as having a new sexual partner) need to be reassessed to determine if you should start having screenings again. Some women have medical problems that increase the chance of getting cervical cancer. In these cases, your health care provider may recommend that you have screening and Pap tests more often.  If you have a family history of uterine cancer or ovarian cancer, talk with your health care provider about genetic screening.  If you have vaginal bleeding after reaching menopause, tell your health care provider.  There are currently no reliable tests available to screen for ovarian cancer.  Lung   Cancer Lung cancer screening is recommended for adults 55-80 years old who are at high risk for lung cancer because of a history of smoking. A yearly low-dose CT scan of the lungs is recommended if you:  Currently smoke.  Have a history of at least 30 pack-years of smoking and you currently smoke or have quit within the past 15 years. A pack-year is smoking an average of one pack of cigarettes per day for one year.  Yearly screening should:  Continue until it has been 15 years since you quit.  Stop if you develop a health problem that would prevent you from having lung  cancer treatment.  Colorectal Cancer  This type of cancer can be detected and can often be prevented.  Routine colorectal cancer screening usually begins at age 50 and continues through age 75.  If you have risk factors for colon cancer, your health care provider may recommend that you be screened at an earlier age.  If you have a family history of colorectal cancer, talk with your health care provider about genetic screening.  Your health care provider may also recommend using home test kits to check for hidden blood in your stool.  A small camera at the end of a tube can be used to examine your colon directly (sigmoidoscopy or colonoscopy). This is done to check for the earliest forms of colorectal cancer.  Direct examination of the colon should be repeated every 5-10 years until age 75. However, if early forms of precancerous polyps or small growths are found or if you have a family history or genetic risk for colorectal cancer, you may need to be screened more often.  Skin Cancer  Check your skin from head to toe regularly.  Monitor any moles. Be sure to tell your health care provider: ? About any new moles or changes in moles, especially if there is a change in a mole's shape or color. ? If you have a mole that is larger than the size of a pencil eraser.  If any of your family members has a history of skin cancer, especially at a young age, talk with your health care provider about genetic screening.  Always use sunscreen. Apply sunscreen liberally and repeatedly throughout the day.  Whenever you are outside, protect yourself by wearing long sleeves, pants, a wide-brimmed hat, and sunglasses.  What should I know about osteoporosis? Osteoporosis is a condition in which bone destruction happens more quickly than new bone creation. After menopause, you may be at an increased risk for osteoporosis. To help prevent osteoporosis or the bone fractures that can happen because of  osteoporosis, the following is recommended:  If you are 19-50 years old, get at least 1,000 mg of calcium and at least 600 mg of vitamin D per day.  If you are older than age 50 but younger than age 70, get at least 1,200 mg of calcium and at least 600 mg of vitamin D per day.  If you are older than age 70, get at least 1,200 mg of calcium and at least 800 mg of vitamin D per day.  Smoking and excessive alcohol intake increase the risk of osteoporosis. Eat foods that are rich in calcium and vitamin D, and do weight-bearing exercises several times each week as directed by your health care provider. What should I know about how menopause affects my mental health? Depression may occur at any age, but it is more common as you become older. Common symptoms of depression   Low or sad mood.  Changes in sleep patterns.  Changes in appetite or eating patterns.  Feeling an overall lack of motivation or enjoyment of activities that you previously enjoyed.  Frequent crying spells.  Talk with your health care provider if you think that you are experiencing depression. What should I know about immunizations? It is important that you get and maintain your immunizations. These include:  Tetanus, diphtheria, and pertussis (Tdap) booster vaccine.  Influenza every year before the flu season begins.  Pneumonia vaccine.  Shingles vaccine.  Your health care provider may also recommend other immunizations. This information is not intended to replace advice given to you by your health care provider. Make sure you discuss any questions you have with your health care provider. Document Released: 10/02/2005 Document Revised: 02/28/2016 Document Reviewed: 05/14/2015 [employer]vier Interactive Patient Education  2018 [employer].

## 2017-07-07 NOTE — Progress Notes (Signed)
Subjective:  By signing my name below, I, Katherine Sanchez, attest that this documentation has been prepared under the direction and in the presence of Delman Cheadle, MD. Electronically Signed: Moises Sanchez, [place]. 07/07/2017 , 12:44 PM .  Patient was seen in Room 2 .   Patient ID: Katherine Sanchez, female    DOB: [birth year], 61 y.o.   MRN: [id number] Chief Complaint  Patient presents with  . Annual Exam   HPI  Katherine Sanchez is a 61 y.o. female who presents to Primary Care at [facility] for annual physical.   Primary Preventative Screenings: Cervical Cancer: Pap 07/02/2016 normal w/ neg HR HPV. (also nml 04/13/14) No h/o abnml so repeat in 5 years which will be last pap if nml.  Maternal GM with ovarian cancer in her 28s. No female cancers in maternal family. STI screening: Neg HIV and Hep C 06/2016; she denies need for STI screening.  [facility] Colorectal Cancer: 11/18/2016 by Dr. [place] Cellar. Repeat in 3 yrs as 2 4-15m polyps in cecum, 5 4-639mpolyps in ascending colon, and 2 5-43m343molyps in sigmoid colon - all 9 were tubular adenomas w/o high-grade dysplasia or malignancy. Colon noted to be tortuous with diverticuli and internal hemorrhoids.  Tobacco use/EtOH/substances: Bone Density: no prior dexa due to age. Cardiac: EKG 03/24/2015 w/ minor non-diagnostic abnmls.  Did have nuclear chemical stress test 01/22/99 w/ Dr. PauDorris Carnesen pt was having CP - Persantine Cardiolite. "Clinically positive and electrically negative. Scar and ischemia in the anteroseptal region and scar in the distal anteroseptal wall.  EF 50%." - high risk abnml study so was followed by a negative cardiac cath. Patient reports seeing Dr. GanEinar Gipst year (Oct 2017) for walking stress test. She takes daily baby aspirin.  Weight/Sanchez sugar/Diet/Exercise:  Goal weight 140-150 lbs. Working hard on diet and exercise so lost 10 lbs this fall - not eating after 8 pm, heavy protein/low carb  diet. Walking for exercise and [place] [employer] adding a GLP-1 agonist - Trulicity to help w/ efforts at visit sev wks ago. BMI Readings from Last 3 Encounters:  07/07/17 37.90 kg/m  06/22/17 38.41 kg/m  03/24/17 40.24 kg/m   Lab Results  Component Value Date   HGBA1C 6.2 06/22/2017   OTC/Vit/Supp/Herbal: vit C, asa 81, prn ibuprofen. She mentions having occasional cramps in her feet; her sister takes magnesium for improvement. Patient hasn't been drinking milk recently, as trying to lose weight. She does have a rare bowl of cereal. She denies being on calcium or Vitamin D supplement.  Dentist/Optho: [facility]o 07[facility]2016 [facility] Immunizations: shingles vaccine? (sent rx for zotavax to her pharm last year). She didn't receive her shingles vaccine. She was recommended shingrex. Had flu shot 2 wks ago.  Immunization History  Administered Date(s) Administered  . Influenza Split 06/24/2013  . Influenza, Seasonal, Injecte, Preservative Fre 07/27/2012  . Influenza,inj,Quad PF,6+ Mos 06/09/2016  . Influenza-Unspecified 06/22/2017  . Pneumococcal Polysaccharide-23 07/02/2016  . Tdap 07/02/2016    [facility]  Chronic Medical Conditions: Seen in OV 2 wks ago to review chronic medical conditions - see 10/30 visit.  DM: diagnosed [id number] go to DM education this past yr. She started Trulicity last Tuesday (8 days ago). She checked her fasting Sanchez sugar this morning, at 134. Last night, after dinner and before bed was at 164. She notes being a little constipated since she's started  Trulicity.  She mentions occasionally feeling numb in her 3rd and 4th toes.  HTN:  HLD: goal LDL <70. was on low dose simvastatin >5 yrs ago but all lipid panels since have been relatively good with LDL  Lab Results  Component Value Date   LDLCALC 86 12/31/2016   [place] 79 07/02/2016   [place] 93 03/24/2015   Hypothyroid: Takes levothyroxine every  morning by itself on an empty stomach with just some water. Dose increased from 25 to 50 mcg this summer and repeat thyroid panel improved with tsh towards mid range. OSA: since 2003. on CPAP followed by Dr. Rexene Alberts Knee arthralgias: h/o arthroscopy Saw podiatry - Dr. Celesta Gentile for B pes planus and plantar fasciitis ~1 yr ago. Placed in custom orthotics. She has new orthotics from podiatry.  Seasonal allergies + ear stuffed: She's been using flonase nasal spray as she has seasonal allergies. She also mentions her ears feels stuffed up with some ringing. She hasn't been using zyrtec or allegra.   Past Medical History:  Diagnosis Date  . Abdominal distension   . Arthritis    knees   . Constipation   . Diabetes mellitus without complication ([place])   . GERD (gastroesophageal reflux disease)   . Heart murmur   . Hyperlipidemia   . Hypertension   . Hypothyroidism   . Incisional hernia   . Pneumonia    hx of 2006   . Sleep apnea    cpap x 10 yrs Dr [name]   . Thyroid disease    Prior to Admission medications   Medication Sig Start Date End Date Taking? Authorizing Provider  amLODipine-benazepril (LOTREL) 10-20 MG capsule Take 1 capsule by mouth daily. 01/05/17   Shawnee Knapp, MD  Ascorbic Acid (VITAMIN C) POWD Take 1,000 mg by mouth daily.     [provider]  aspirin 81 MG tablet Take 81 mg by mouth daily.    [provider]  Sanchez glucose meter kit and supplies KIT Dispense based on patient and insurance preference. Use up to one time as directed  (FOR ICD-9 250.00, 250.01). 03/24/15   Le, [name], DO  docusate sodium (COLACE) 100 MG capsule Take 100 mg by mouth 2 (two) times daily as needed.      [provider]  Dulaglutide (TRULICITY) 8.33 AS/5.0NL SOPN Inject 0.75 mg once a week into the skin. 06/29/17   Shawnee Knapp, MD  fluticasone [facility]) 50 MCG/ACT nasal spray PLACE 2 SPRAYS INTO BOTH NOSTRILS EVERY DAY 10/02/16   Shawnee Knapp, MD  glucose Sanchez (ONE TOUCH  ULTRA TEST) test strip USE UP TO 1 TIME AS DIRECTED 09/30/16   Harrison Mons, PA-C  ibuprofen (ADVIL,MOTRIN) 200 MG tablet Take 200 mg by mouth every 6 (six) hours as needed.      [provider]  levothyroxine (SYNTHROID, LEVOTHROID) 50 MCG tablet TAKE 1 TABLET BY MOUTH EVERY DAY BEFORE BREAKFAST 06/29/17   Shawnee Knapp, MD  meloxicam (MOBIC) 15 MG tablet Take 1 tablet (15 mg total) by mouth daily as needed for pain. 08/05/16   Shawnee Knapp, MD  metFORMIN (GLUCOPHAGE XR) 500 MG 24 hr tablet Take 2 tablets (1,000 mg total) by mouth daily with breakfast. 06/22/17   Shawnee Knapp, MD  nystatin cream (MYCOSTATIN) Apply 1 application topically 2 (two) times daily. 08/07/16   Shawnee Knapp, MD  [facility] DELICA LANCETS 97Q MISC USE UP TO 1 TIME AS DIRECTED 07/01/17   Delman Cheadle  N, MD   Allergies  Allergen Reactions  . Amoxicillin     hives hives hives   Past Surgical History:  Procedure Laterality Date  . APPENDECTOMY  1982  . CARDIAC CATHETERIZATION     1999 and negative  . CHOLECYSTECTOMY  2010   lap with open redo umbilical hernia repair.  Dr. [name]  . HERNIA REPAIR  2549   umbilical open w mesh plug. Dr. [name] and 2010 Dr [name]   . KNEE SURGERY  2006   left - arthroscopic  . LAPAROSCOPIC VENTRAL HERNIA N/A 08/13/2011   Performed by Adin Hector., MD at [facility] ORS   Family History  Problem Relation Age of Onset  . Cancer Mother        colon  . Heart disease Mother 15       congestive heart failure  . COPD Mother   . Colon cancer Mother 45  . Other Father 65       car accident  . Cancer Maternal Grandmother        ovarian  . Asthma Son   . Allergies Son   . Hypertension Paternal Grandmother   . Cancer Paternal Grandfather        prostate  . Breast cancer Neg Hx    Social History   Socioeconomic History  . Marital status: Married    Spouse name: None  . Number of children: None  . Years of education: None  . Highest education level: None  Social Needs  .  Financial resource strain: None  . Food insecurity - worry: None  . Food insecurity - inability: None  . Transportation needs - medical: None  . Transportation needs - non-medical: None  Occupational History  . 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 Smoking status: Former Smoker    Packs/day: 0.10    Years: 4.00    Pack years: 0.40    Types: Cigarettes    Last attempt to quit: 07/07/1978    Years since quitting: 39.0  . Smokeless tobacco: Never Used  . Tobacco comment: occassional smoker with alcohol consumption  Substance and Sexual Activity  . Alcohol use: No    Comment: quit 1979  . Drug use: No  . Sexual activity: Yes    Birth control/protection: None    Comment: number of sex partners in the last 42 months 1  Other Topics Concern  . None  Social History Narrative   Lives with husband and his mother is currently living with them after sustaining a fall in Oct. 2013.   Exercise walking 2 times daily   Depression screen [facility] 2/9 06/22/2017 12/31/2016 07/02/2016 06/12/2016 03/24/2015  Decreased Interest 0 0 0 0 0  Down, Depressed, Hopeless 0 0 0 1 0  PHQ - 2 Score 0 0 0 1 0    Review of Systems  Constitutional: Negative for chills, fatigue, fever and unexpected weight change.  HENT: Positive for ear pain. Negative for ear discharge.   Respiratory: Negative for cough.   Gastrointestinal: Negative for constipation, diarrhea, nausea and vomiting.  Skin: Negative for rash and wound.  Neurological: Positive for numbness. Negative for dizziness, weakness and headaches.       Objective:   Physical Exam  Constitutional: She is oriented to person, place, and time. She appears well-developed and well-nourished. No distress.  HENT:  Head: Normocephalic and atraumatic.  Right Ear: Tympanic membrane is not injected and not retracted. A middle ear effusion (mild) is present.  Left Ear: Tympanic membrane and ear canal normal.  Mouth/Throat: Oropharynx is clear and  moist.  Nares are narrow; left nare with rhinitis  Eyes: EOM are normal. Pupils are equal, round, and reactive to light.  Neck: Neck supple. Carotid bruit is not present. Thyromegaly (mildly increased in size) present.  Cardiovascular: Normal rate, regular rhythm and normal heart sounds.  No murmur heard. Pulses:      Posterior tibial pulses are 2+ on the right side, and 2+ on the left side.  Pulmonary/Chest: Effort normal. No respiratory distress.  Musculoskeletal: Normal range of motion.  Right foot: mild thickening of the first great toe, mild tinea changes of the base of foot; decreased sensation to light touch in 4th toe throughout Left foot: decreased sensation to light touch in 4th toe throughout. No lower extremity edema bilaterally, no rashes on the left  Neurological: She is alert and oriented to person, place, and time.  Skin: Skin is warm and dry.  Psychiatric: She has a normal mood and affect. Her behavior is normal.  Nursing note and vitals reviewed.   BP 118/73   Pulse 68   Temp (!) 97.4 F (36.3 C)   Resp 16   Ht '5\' 2"'  (1.575 m)   Wt 207 lb 3.2 oz (94 kg)   SpO2 96%   BMI 37.90 kg/m   Wt Readings from Last 3 Encounters:  07/07/17 207 lb 3.2 oz (94 kg)  06/22/17 210 lb (95.3 kg)  03/24/17 220 lb (99.8 kg)       Assessment & Plan:   Foot exam, EKG, ua, microalb, lipid, cbc Consider checking vit D Shingrix? ---- If can't tolerate trulicity for weight loss, cons holding metformin.  If that doesn't work, could consider increasing levothyroxine to 75 trial. If ldl still > 70, rec trial of low dose statin F/u March to review chronic medical conditions  1. Annual physical exam   2. Screening for cardiovascular, respiratory, and genitourinary diseases   3. Screening for deficiency anemia   4. Encounter for screening mammogram for breast cancer   5. Encounter for vitamin deficiency screening   6. Class 2 severe obesity due to excess calories with serious  comorbidity and body mass index (BMI) of 38.0 to 38.9 in adult ([place])   7. Type 2 diabetes mellitus without complication, with long-term current use of insulin ([place])   8. Acquired hypothyroidism   9. Primary osteoarthritis of both knees   10. Essential hypertension, benign   11. Chronic ischemic heart disease     Orders Placed This Encounter  Procedures  . Microalbumin/Creatinine Ratio, Urine  . Lipid panel    Order Specific Question:   Has the patient fasted?    Answer:   Yes  . CBC  . VITAMIN D 25 Hydroxy (Vit-D Deficiency, Fractures)  . POCT urinalysis dipstick  . EKG 12-Lead  . HM DIABETES FOOT EXAM    No orders of the defined types were placed in this encounter.   I personally performed the services described in this documentation, which was scribed in my presence. The recorded information has been reviewed and considered, and addended by me as needed.   Delman Cheadle, M.D.  Primary Care at [facility] [address] [place], [place] [address] [phone] phone [phone] fax  07/10/17 6:29 AM

## 2017-07-07 NOTE — Progress Notes (Deleted)
Subjective:    Patient ID: Katherine Sanchez, female    DOB: 02-11-1956, 61 y.o.   MRN: [id number]  HPI   Primary Preventative Screenings: Cervical Cancer: Pap 07/02/2016 normal w/ neg HR HPV. (also nml 04/13/14) No h/o abnml so repeat in 5 years which will be last pap if nml.  Maternal GM with ovarian cancer in her 56s. No female cancers in maternal family. STI screening: Neg HIV and Hep C 06/2016 Breast Cancer: [facility]/17/[facility] Colorectal Cancer: 11/18/2016 by Dr. Hendry Cellar. Repeat in 3 yrs as 2 4-26mm polyps in cecum, 5 4-15mm polyps in ascending colon, and 2 5-43mm polyps in sigmoid colon - all 9 were tubular adenomas w/o high-grade dysplasia or malignancy. Colon noted to be tortuous with diverticuli and internal hemorrhoids.  Tobacco use/EtOH/substances: Bone Density: no prior dexa due to age. Cardiac: EKG 03/24/2015 w/ minor non-diagnostic abnmls.  Did have nuclear chemical stress test 01/22/99 w/ Dr. Dorris Carnes when pt was having CP - Persantine Cardiolite. "Clinically positive and electrically negative. Scar and ischemia in the anteroseptal region and scar in the distal anteroseptal wall.  EF 50%." - high risk abnml study so was followed by a negative cardiac cath?????????? Weight/Blood sugar/Diet/Exercise:  Goal weight 140-150 lbs. Working hard on diet and exercise so lost 10 lbs this fall - not eating after 8 pm, heavy protein/low carb diet. Walking for exercise and going to [employer]. Tried adding a GLP-1 agonist - Trulicity to help w/ efforts at visit sev wks ago. BMI Readings from Last 3 Encounters:  06/22/17 38.41 kg/m  03/24/17 40.24 kg/m  12/31/16 39.97 kg/m   Lab Results  Component Value Date   HGBA1C 6.2 06/22/2017   OTC/Vit/Supp/Herbal: vit C, asa 81, prn ibuprofen?  Dentist/Optho: [facility]. Immunizations: shingles vaccine? (sent rx for zotavax to her pharm last year). Had flu shot 2 wks ago Immunization History    Administered Date(s) Administered  . Influenza Split 06/24/2013  . Influenza, Seasonal, Injecte, Preservative Fre 07/27/2012  . Influenza,inj,Quad PF,6+ Mos 06/09/2016  . Pneumococcal Polysaccharide-23 07/02/2016  . Tdap 07/02/2016    [facility], dermatology, this summer?  Chronic Medical Conditions: Seen in OV 2 wks ago to review chronic medical conditions - see 10/30 visit. DM: diagnosed 65. Did go to DM education this past yr. HTN: HLD: goal LDL <70. was on low dose simvastatin >5 yrs ago but all lipid panels since have been relatively good with LDL  Lab Results  Component Value Date   LDLCALC 86 12/31/2016   [place] 79 07/02/2016   [place] 93 03/24/2015   Hypothyroid: Takes levothyroxine every morning by itself on an empty stomach with just some water. Dose increased from 25 to 50 mcg this summer and repeat thyroid panel improved with tsh towards mid range. OSA: since 2003. on CPAP followed by Dr. Rexene Alberts Knee arthralgias: h/o arthroscopy Saw podiatry - Dr. Celesta Gentile for B pes planus and plantar fasciitis ~1 yr ago. Placed in custom orthotics. Review of Systems     Objective:   Physical Exam       BP 118/73   Pulse 68   Temp (!) 97.4 F (36.3 C)   Resp 16   Ht 5\' 2"  (1.575 m)   Wt 207 lb 3.2 oz (94 kg)   SpO2 96%   BMI 37.90 kg/m    Assessment & Plan:   Foot exam, EKG, ua, microalb, lipid, cbc Consider checking vit  D Shingrix? ---- If can't tolerate trulicity for weight loss, cons holding metformin.  If that doesn't work, could consider increasing levothyroxine to 75 trial. If ldl still > 70, rec trial of low dose statin F/u March to review chronic medical conditions  1. Annual physical exam   2. Screening for cardiovascular, respiratory, and genitourinary diseases   3. Screening for deficiency anemia   4. Encounter for screening mammogram for breast cancer   5. Encounter for vitamin deficiency screening   6. Class 2 severe obesity due to excess  calories with serious comorbidity and body mass index (BMI) of 38.0 to 38.9 in adult ([place])   7. Type 2 diabetes mellitus without complication, with long-term current use of insulin ([place])   8. Acquired hypothyroidism   9. Primary osteoarthritis of both knees   10. Essential hypertension, benign   11. Chronic ischemic heart disease     Orders Placed This Encounter  Procedures  . Microalbumin/Creatinine Ratio, Urine  . Lipid panel    Order Specific Question:   Has the patient fasted?    Answer:   Yes  . CBC  . VITAMIN D 25 Hydroxy (Vit-D Deficiency, Fractures)  . POCT urinalysis dipstick  . EKG 12-Lead  . HM DIABETES FOOT EXAM    No orders of the defined types were placed in this encounter.   I personally performed the services described in this documentation, which was scribed in my presence. The recorded information has been reviewed and considered, and addended by me as needed.   Delman Cheadle, M.D.  Primary Care at [facility] [address] [place], [place] [address] [phone] phone [phone] fax  07/10/17 6:27 AM

## 2017-07-08 LAB — CBC
Hematocrit: 41.3 % (ref 34.0–46.6)
Hemoglobin: 14 g/dL (ref 11.1–15.9)
MCH: 28.6 pg (ref 26.6–33.0)
MCHC: 33.9 g/dL (ref 31.5–35.7)
MCV: 85 fL (ref 79–97)
PLATELETS: 298 10*3/uL (ref 150–379)
RBC: 4.89 x10E6/uL (ref 3.77–5.28)
RDW: 14.3 % (ref 12.3–15.4)
WBC: 6.9 10*3/uL (ref 3.4–10.8)

## 2017-07-08 LAB — LIPID PANEL
CHOLESTEROL TOTAL: 168 mg/dL (ref 100–199)
Chol/HDL Ratio: 3.7 ratio (ref 0.0–4.4)
HDL: 46 mg/dL (ref >39)
LDL CALC: 100 mg/dL — AB (ref 0–99)
Triglycerides: 111 mg/dL (ref 0–149)
VLDL CHOLESTEROL CAL: 22 mg/dL (ref 5–40)

## 2017-07-08 LAB — MICROALBUMIN / CREATININE URINE RATIO
Creatinine, Urine: 30 mg/dL
Microalb/Creat Ratio: <10 mg/g creat (ref 0.0–30.0)
Microalbumin, Urine: <3 ug/mL

## 2017-07-08 LAB — VITAMIN D 25 HYDROXY (VIT D DEFICIENCY, FRACTURES): VIT D 25 HYDROXY: 25.5 ng/mL — AB (ref 30.0–100.0)

## 2017-07-12 ENCOUNTER — Telehealth: Payer: Self-pay | Admitting: Family Medicine

## 2017-07-12 MED ORDER — GLUCOSE BLOOD VI STRP: ORAL_STRIP | 0 refills | Status: DC

## 2017-07-12 NOTE — Telephone Encounter (Signed)
Left voicemail letting pt know touch strips were refilled.

## 2017-07-12 NOTE — Telephone Encounter (Signed)
Copied from [place] [id number]. Topic: Quick Communication - See Telephone Encounter >> Jul 12, 2017 10:07 AM Corie Chiquito, NT wrote: CRM for notification. See Telephone encounter for: Patient called because she needs a order called in for her test strips for her blood glucose. Patient uses [facility]  07/12/17.

## 2017-07-22 DIAGNOSIS — G4733 Obstructive sleep apnea (adult) (pediatric): Secondary | ICD-10-CM | POA: Diagnosis not present

## 2017-08-30 ENCOUNTER — Other Ambulatory Visit: Payer: Self-pay | Admitting: Family Medicine

## 2017-10-06 ENCOUNTER — Other Ambulatory Visit: Payer: Self-pay | Admitting: Family Medicine

## 2017-10-18 ENCOUNTER — Other Ambulatory Visit: Payer: Self-pay | Admitting: Family Medicine

## 2017-10-27 ENCOUNTER — Other Ambulatory Visit: Payer: Self-pay | Admitting: Family Medicine

## 2017-11-10 LAB — HM DIABETES EYE EXAM

## 2017-11-15 ENCOUNTER — Other Ambulatory Visit: Payer: Self-pay | Admitting: Family Medicine

## 2017-11-16 ENCOUNTER — Encounter: Payer: Self-pay | Admitting: Family Medicine

## 2017-11-16 NOTE — Telephone Encounter (Signed)
Sent pt mychart message asker her to sched OV w/in the next 3 mos for additional refills. Trulicity sent to pharm.

## 2017-12-11 ENCOUNTER — Other Ambulatory Visit: Payer: Self-pay | Admitting: Family Medicine

## 2017-12-23 ENCOUNTER — Encounter: Payer: Self-pay | Admitting: *Deleted

## 2018-01-29 ENCOUNTER — Other Ambulatory Visit: Payer: Self-pay | Admitting: Family Medicine

## 2018-02-03 DIAGNOSIS — G4733 Obstructive sleep apnea (adult) (pediatric): Secondary | ICD-10-CM | POA: Diagnosis not present

## 2018-02-08 ENCOUNTER — Other Ambulatory Visit: Payer: Self-pay | Admitting: Family Medicine

## 2018-02-10 NOTE — Telephone Encounter (Signed)
Interface request for refill on Trulicity 0.62 6 syringe 0 refills.      Last refill 11/16/17   LOV 07/07/17 with Delman Cheadle MD    Pharmacy:  [facility] [address]

## 2018-02-12 ENCOUNTER — Other Ambulatory Visit: Payer: Self-pay | Admitting: Family Medicine

## 2018-02-13 ENCOUNTER — Other Ambulatory Visit: Payer: Self-pay | Admitting: Family Medicine

## 2018-02-14 NOTE — Telephone Encounter (Signed)
Metformin HCL ER 500 mg refill request  LOV 07/07/17 with Dr. Brigitte Pulse.    Needs appt per recent notes for refills.  [facility] [address] [place], [place]

## 2018-02-14 NOTE — Telephone Encounter (Signed)
Patient called, left VM that [facility] [address] 02/10/18. Also advised she's due for a F/U visit with Dr. Brigitte Pulse per note last OV, she was to return in February, advised to call the office to schedule the appointment. [facility] called and spoke to [facility] who confirmed the medication is available for pickup.

## 2018-02-22 ENCOUNTER — Other Ambulatory Visit: Payer: Self-pay

## 2018-02-22 ENCOUNTER — Ambulatory Visit: Payer: Federal, State, Local not specified - PPO | Admitting: Family Medicine

## 2018-02-22 ENCOUNTER — Encounter: Payer: Self-pay | Admitting: Family Medicine

## 2018-02-22 VITALS — BP 126/79 | HR 61 | Temp 97.9°F | Resp 17 | Ht 62.0 in | Wt 195.4 lb

## 2018-02-22 DIAGNOSIS — E039 Hypothyroidism, unspecified: Secondary | ICD-10-CM | POA: Diagnosis not present

## 2018-02-22 DIAGNOSIS — Z794 Long term (current) use of insulin: Secondary | ICD-10-CM

## 2018-02-22 DIAGNOSIS — E119 Type 2 diabetes mellitus without complications: Secondary | ICD-10-CM | POA: Diagnosis not present

## 2018-02-22 DIAGNOSIS — E559 Vitamin D deficiency, unspecified: Secondary | ICD-10-CM | POA: Diagnosis not present

## 2018-02-22 DIAGNOSIS — I10 Essential (primary) hypertension: Secondary | ICD-10-CM | POA: Diagnosis not present

## 2018-02-22 DIAGNOSIS — I259 Chronic ischemic heart disease, unspecified: Secondary | ICD-10-CM

## 2018-02-22 DIAGNOSIS — L659 Nonscarring hair loss, unspecified: Secondary | ICD-10-CM

## 2018-02-22 DIAGNOSIS — E669 Obesity, unspecified: Secondary | ICD-10-CM

## 2018-02-22 DIAGNOSIS — Z79899 Other long term (current) drug therapy: Secondary | ICD-10-CM

## 2018-02-22 DIAGNOSIS — G4733 Obstructive sleep apnea (adult) (pediatric): Secondary | ICD-10-CM

## 2018-02-22 DIAGNOSIS — E538 Deficiency of other specified B group vitamins: Secondary | ICD-10-CM

## 2018-02-22 LAB — POCT GLYCOSYLATED HEMOGLOBIN (HGB A1C): Hemoglobin A1C: 5.5 % (ref 4.0–5.6)

## 2018-02-22 MED ORDER — METFORMIN HCL ER 500 MG PO TB24: 1000.0000 mg | ORAL_TABLET | Freq: Two times a day (BID) | ORAL | 3 refills | Status: DC

## 2018-02-22 MED ORDER — DULAGLUTIDE 0.75 MG/0.5ML ~~LOC~~ SOAJ: SUBCUTANEOUS | 1 refills | Status: DC

## 2018-02-22 NOTE — Progress Notes (Signed)
Subjective:  By signing my name below, I, [name], attest that this documentation has been prepared under the direction and in the presence of Delman Cheadle, MD. Electronically Signed: [name], [place]. 02/22/2018 , 4:46 PM .  Patient was seen in Room 2 .   Patient ID: Katherine Sanchez, female    DOB: Dec 29, 1955, 62 y.o.   MRN: [id number] Chief Complaint  Patient presents with  . trulicity    follow up   HPI Katherine Sanchez is a 62 y.o. female who presents to Primary Care at [facility] for follow up. Patient states she's been eating better, mostly boiled egg for breakfast, salad for lunch, and small dinner on small plate. She isn't eating after 7:00-8:00 PM. She's also walking 30 minutes twice a day, once in the morning and once in the evening. She isn't fasting today, egg for breakfast and a ham and cheese roll up for lunch.   She's taking metformin 1055m bid. She brought in her glucometer with blood sugar readings, with low of 94 in the morning, and then high of 177 in the evening; mostly 90s-100s in the mornings and 130s in the evenings. She denies hypoglycemic symptoms. She mentions being really stressed with mother-in-law going into nursing home in April, as well as her brother passing away simultaneously. She hasn't taken baby aspirin in about a month. Last seen eye doctor on March 20th. She takes colace occasionally.   She's noticed an area of hair thinning over the top of her scalp. She believes it could be from her CPAP mask's head band, pulling on her hair. She denies any fatigue with using her CPAP. She does have occasional leg cramps. She checks her blood pressure, 120s-130s/80s; has a BP cuff at home. After losing weight, her knee pains have improved. When she stands for an extended amount of time, she has some pain and then takes a meloxicam for relief. She denies diaphoresis or tremors. She takes Vitamin D + calcium combination supplement. She also takes magnesium supplement  for her leg cramps.   Lab Results  Component Value Date   HGBA1C 5.5 02/22/2018   HGBA1C 6.2 06/22/2017   HGBA1C 6.6 12/31/2016    Past Medical History:  Diagnosis Date  . Abdominal distension   . Arthritis    knees   . Constipation   . Diabetes mellitus without complication ([place]   . GERD (gastroesophageal reflux disease)   . Heart murmur   . Hyperlipidemia   . Hypertension   . Hypothyroidism   . Incisional hernia   . Pneumonia    hx of 2006   . Sleep apnea    cpap x 10 yrs Dr CGwenette Greet  . Thyroid disease    Past Surgical History:  Procedure Laterality Date  . APPENDECTOMY  1982  . CARDIAC CATHETERIZATION     1999 and negative  . CHOLECYSTECTOMY  2010   lap with open redo umbilical hernia repair.  Dr. WRise Patience . HERNIA REPAIR  [id number]  umbilical open w mesh plug. Dr. BBubba Campand 2010 Dr WRise Patience  . KNEE SURGERY  2006   left - arthroscopic  . VENTRAL HERNIA REPAIR  08/13/2011   Procedure: LAPAROSCOPIC VENTRAL HERNIA;  Surgeon: SAdin Hector MD;  Location: [facility];  Service: General;  Laterality: N/A;  Laparoscopic Ventral Hernia Repair with Mesh   Prior to Admission medications   Medication Sig Start Date End Date Taking? Authorizing Provider  amLODipine-benazepril (LOTREL) 10-20 MG capsule  TAKE 1 CAPSULE BY MOUTH EVERY DAY 10/06/17   Shawnee Knapp, MD  Ascorbic Acid (VITAMIN C) POWD Take 1,000 mg by mouth daily.     [provider]  aspirin 81 MG tablet Take 81 mg by mouth daily.    [provider]  blood glucose meter kit and supplies KIT Dispense based on patient and insurance preference. Use up to one time as directed  (FOR ICD-9 250.00, 250.01). 03/24/15   Le, [name], DO  docusate sodium (COLACE) 100 MG capsule Take 100 mg by mouth 2 (two) times daily as needed.      [provider]  fluticasone (FLONASE) 50 MCG/ACT nasal spray USE 2 SPRAYS IN BOTH NOSTRILS EVERY DAY 12/13/17   Shawnee Knapp, MD  glucose blood (ONE TOUCH ULTRA TEST) test  strip USE UP TO 1 TIME AS DIRECTED 01/31/18   Shawnee Knapp, MD  ibuprofen (ADVIL,MOTRIN) 200 MG tablet Take 200 mg by mouth every 6 (six) hours as needed.      [provider]  levothyroxine (SYNTHROID, LEVOTHROID) 50 MCG tablet TAKE 1 TABLET BY MOUTH EVERY DAY BEFORE BREAKFAST 06/29/17   Shawnee Knapp, MD  meloxicam (MOBIC) 15 MG tablet Take 1 tablet (15 mg total) by mouth daily as needed for pain. 08/05/16   Shawnee Knapp, MD  metFORMIN (GLUCOPHAGE XR) 500 MG 24 hr tablet Take 2 tablets (1,000 mg total) by mouth daily with breakfast. 06/22/17   Shawnee Knapp, MD  metFORMIN (GLUCOPHAGE-XR) 500 MG 24 hr tablet TAKE 2 TABLETS (1,000 MG TOTAL) BY MOUTH 2 (TWO) TIMES DAILY WITH A MEAL. 02/15/18   Shawnee Knapp, MD  nystatin cream (MYCOSTATIN) Apply 1 application topically 2 (two) times daily. 08/07/16   Shawnee Knapp, MD  [facility] DELICA LANCETS 78M MISC USE UP TO 1 TIME AS DIRECTED 07/01/17   Shawnee Knapp, MD  TRULICITY 7.67 MC/9.4BS SOPN INJECT 0.75 MG INTO THE SKIN ONCE A WEEK. **NEEDS OFFICE VISIT FOR ADDITIONAL REFILLS** 02/10/18   Shawnee Knapp, MD   Allergies  Allergen Reactions  . Amoxicillin     hives hives hives   Family History  Problem Relation Age of Onset  . Cancer Mother        colon  . Heart disease Mother 57       congestive heart failure  . COPD Mother   . Colon cancer Mother 4  . Other Father 53       car accident  . Cancer Maternal Grandmother        ovarian  . Asthma Son   . Allergies Son   . Hypertension Paternal Grandmother   . Cancer Paternal Grandfather        prostate  . Breast cancer Neg Hx    Social History   Socioeconomic History  . Marital status: Married    Spouse name: Not on file  . Number of children: Not on file  . Years of education: Not on file  . Highest education level: Not on file  Occupational History  . 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 Financial resource strain: Not on file  . Food insecurity:    Worry: Not on  file    Inability: Not on file  . Transportation needs:    Medical: Not on file    Non-medical: Not on file  Tobacco Use  . Smoking status: Former Smoker    Packs/day: 0.10    Years: 4.00  Pack years: 0.40    Types: Cigarettes    Last attempt to quit: 07/07/1978    Years since quitting: 39.6  . Smokeless tobacco: Never Used  . Tobacco comment: occassional smoker with alcohol consumption  Substance and Sexual Activity  . Alcohol use: No    Comment: quit 1979  . Drug use: No  . Sexual activity: Yes    Birth control/protection: None    Comment: number of sex partners in the last 12 months 1  Lifestyle  . Physical activity:    Days per week: Not on file    Minutes per session: Not on file  . Stress: Not on file  Relationships  . Social connections:    Talks on phone: Not on file    Gets together: Not on file    Attends religious service: Not on file    Active member of club or organization: Not on file    Attends meetings of clubs or organizations: Not on file    Relationship status: Not on file  Other Topics Concern  . Not on file  Social History Narrative   Lives with husband and his mother is currently living with them after sustaining a fall in Oct. 2013.   Exercise walking 2 times daily   Depression screen [facility] 2/9 02/22/2018 06/22/2017 12/31/2016 07/02/2016 06/12/2016  Decreased Interest 0 0 0 0 0  Down, Depressed, Hopeless 0 0 0 0 1  PHQ - 2 Score 0 0 0 0 1    Review of Systems  Constitutional: Negative for chills, fatigue, fever and unexpected weight change.  Respiratory: Negative for cough.   Gastrointestinal: Negative for constipation, diarrhea, nausea and vomiting.  Skin: Negative for rash and wound.  Neurological: Negative for dizziness, weakness and headaches.       Objective:   Physical Exam  Constitutional: She is oriented to person, place, and time. She appears well-developed and well-nourished. No distress.  HENT:  Head: Normocephalic and  atraumatic.  Eyes: Pupils are equal, round, and reactive to light. EOM are normal.  Neck: Neck supple.  Cardiovascular: Normal rate and regular rhythm.  Murmur heard.  Systolic (pan) murmur is present with a grade of 3/6. Pulmonary/Chest: Effort normal. No respiratory distress.  Musculoskeletal: Normal range of motion.  Neurological: She is alert and oriented to person, place, and time.  Skin: Skin is warm and dry.  Psychiatric: She has a normal mood and affect. Her behavior is normal.  Nursing note and vitals reviewed.   BP 126/79 (BP Location: Right Arm, Patient Position: Sitting, Cuff Size: Large)   Pulse 61   Temp 97.9 F (36.6 C) (Oral)   Resp 17   Ht '5\' 2"'  (1.575 m)   Wt 195 lb 6.4 oz (88.6 kg)   SpO2 96%   BMI 35.74 kg/m   Wt Readings from Last 3 Encounters:  02/22/18 195 lb 6.4 oz (88.6 kg)  07/07/17 207 lb 3.2 oz (94 kg)  06/22/17 210 lb (95.3 kg)   Results for orders placed or performed in visit on 02/22/18  POCT glycosylated hemoglobin (Hb A1C)  Result Value Ref Range   Hemoglobin A1C 5.5 4.0 - 5.6 %   HbA1c POC (<> result, manual entry)  4.0 - 5.6 %   HbA1c, POC (prediabetic range)  5.7 - 6.4 %   HbA1c, POC (controlled diabetic range)  0.0 - 7.0 %        Assessment & Plan:  REFILL LEVOTHYROXINE WHEN LABS RETURN  1. Type 2  diabetes mellitus without complication, with long-term current use of insulin ([place])   2. Essential hypertension, benign   3. Obesity (BMI 30-39.9)   4. Acquired hypothyroidism - tsh nml - cont levothyroxine 50  5. Chronic ischemic heart disease   6. OSA (obstructive sleep apnea)   7. Vitamin D deficiency - still slightly to low today - increase daily supp by 1000u  8. Hair loss   9. Encounter for long-term (current) use of high-risk medication     Orders Placed This Encounter  Procedures  . Comprehensive metabolic panel  . VITAMIN D 25 Hydroxy (Vit-D Deficiency, Fractures)  . TSH  . Iron, TIBC and Ferritin Panel  . Vitamin B12   . TSH  . VITAMIN D 25 Hydroxy (Vit-D Deficiency, Fractures)  . Iron, TIBC and Ferritin Panel  . Vitamin B12  . Specimen status report  . POCT glycosylated hemoglobin (Hb A1C)    Meds ordered this encounter  Medications  . metFORMIN (GLUCOPHAGE-XR) 500 MG 24 hr tablet    Sig: Take 2 tablets (1,000 mg total) by mouth 2 (two) times daily with a meal.    Dispense:  360 tablet    Refill:  3  . Dulaglutide (TRULICITY) 0.90 PW/2.5IP SOPN    Sig: INJECT 0.75 MG INTO THE SKIN ONCE A WEEK    Dispense:  6 mL    Refill:  1    Pt does not need currently. Please add this on as refills to current rx.   I personally performed the services described in this documentation, which was scribed in my presence. The recorded information has been reviewed and considered, and addended by me as needed.   Delman Cheadle, M.D.  Primary Care at [facility] [address] [place], [place] [address] [phone] phone [phone] fax  06/09/18 1:16 PM

## 2018-02-22 NOTE — Patient Instructions (Addendum)
   IF you received an x-ray today, you will receive an invoice from [facility]. Please contact [facility] at [phone] with questions or concerns regarding your invoice.   IF you received labwork today, you will receive an invoice from [facility]. Please contact [facility] at [phone] with questions or concerns regarding your invoice.   Our billing staff will not be able to assist you with questions regarding bills from these companies.  You will be contacted with the lab results as soon as they are available. The fastest way to get your results is to activate your My Chart account. Instructions are located on the last page of this paperwork. If you have not heard from us regarding the results in 2 weeks, please contact this office.     Exercising to Lose Weight Exercising can help you to lose weight. In order to lose weight through exercise, you need to do vigorous-intensity exercise. You can tell that you are exercising with vigorous intensity if you are breathing very hard and fast and cannot hold a conversation while exercising. Moderate-intensity exercise helps to maintain your current weight. You can tell that you are exercising at a moderate level if you have a higher heart rate and faster breathing, but you are still able to hold a conversation. How often should I exercise? Choose an activity that you enjoy and set realistic goals. Your health care provider can help you to make an activity plan that works for you. Exercise regularly as directed by your health care provider. This may include:  Doing resistance training twice each week, such as: ? Push-ups. ? Sit-ups. ? Lifting weights. ? Using resistance bands.  Doing a given intensity of exercise for a given amount of time. Choose from these options: ? 150 minutes of moderate-intensity exercise every week. ? 75 minutes of vigorous-intensity exercise every week. ? A mix of moderate-intensity and  vigorous-intensity exercise every week.  Children, pregnant women, people who are out of shape, people who are overweight, and older adults may need to consult a health care provider for individual recommendations. If you have any sort of medical condition, be sure to consult your health care provider before starting a new exercise program. What are some activities that can help me to lose weight?  Walking at a rate of at least 4.5 miles an hour.  Jogging or running at a rate of 5 miles per hour.  Biking at a rate of at least 10 miles per hour.  Lap swimming.  Roller-skating or in-line skating.  Cross-country skiing.  Vigorous competitive sports, such as football, basketball, and soccer.  Jumping rope.  Aerobic dancing. How can I be more active in my day-to-day activities?  Use the stairs instead of the elevator.  Take a walk during your lunch break.  If you drive, park your car farther away from work or school.  If you take public transportation, get off one stop early and walk the rest of the way.  Make all of your phone calls while standing up and walking around.  Get up, stretch, and walk around every 30 minutes throughout the day. What guidelines should I follow while exercising?  Do not exercise so much that you hurt yourself, feel dizzy, or get very short of breath.  Consult your health care provider prior to starting a new exercise program.  Wear comfortable clothes and shoes with good support.  Drink plenty of water while you exercise to prevent dehydration or heat stroke. Body water is   lost during exercise and must be replaced.  Work out until you breathe faster and your heart beats faster. This information is not intended to replace advice given to you by your health care provider. Make sure you discuss any questions you have with your health care provider. Document Released: 09/12/2010 Document Revised: 01/16/2016 Document Reviewed: 01/11/2014 Elsevier  Interactive Patient Education  2018 [employer]  

## 2018-02-23 LAB — TSH: TSH: 2.74 u[IU]/mL (ref 0.450–4.500)

## 2018-02-23 LAB — COMPREHENSIVE METABOLIC PANEL
A/G RATIO: 1.4 (ref 1.2–2.2)
ALK PHOS: 88 IU/L (ref 39–117)
ALT: 25 IU/L (ref 0–32)
AST: 23 IU/L (ref 0–40)
Albumin: 4.3 g/dL (ref 3.6–4.8)
BUN/Creatinine Ratio: 21 (ref 12–28)
BUN: 12 mg/dL (ref 8–27)
Bilirubin Total: 0.4 mg/dL (ref 0.0–1.2)
CALCIUM: 10 mg/dL (ref 8.7–10.3)
CO2: 22 mmol/L (ref 20–29)
CREATININE: 0.58 mg/dL (ref 0.57–1.00)
Chloride: 104 mmol/L (ref 96–106)
GFR calc Af Amer: 115 mL/min/{1.73_m2} (ref >59)
GFR, EST NON AFRICAN AMERICAN: 100 mL/min/{1.73_m2} (ref >59)
GLOBULIN, TOTAL: 3 g/dL (ref 1.5–4.5)
Glucose: 88 mg/dL (ref 65–99)
POTASSIUM: 4.5 mmol/L (ref 3.5–5.2)
SODIUM: 142 mmol/L (ref 134–144)
TOTAL PROTEIN: 7.3 g/dL (ref 6.0–8.5)

## 2018-02-23 LAB — VITAMIN D 25 HYDROXY (VIT D DEFICIENCY, FRACTURES): VIT D 25 HYDROXY: 28.2 ng/mL — AB (ref 30.0–100.0)

## 2018-02-24 LAB — IRON,TIBC AND FERRITIN PANEL
Ferritin: 71 ng/mL (ref 15–150)
Iron Saturation: 22 % (ref 15–55)
Iron: 78 ug/dL (ref 27–139)
Total Iron Binding Capacity: 348 ug/dL (ref 250–450)
UIBC: 270 ug/dL (ref 118–369)

## 2018-02-24 LAB — VITAMIN B12: VITAMIN B 12: 181 pg/mL — AB (ref 232–1245)

## 2018-02-24 LAB — SPECIMEN STATUS REPORT

## 2018-02-28 ENCOUNTER — Other Ambulatory Visit: Payer: Self-pay | Admitting: Family Medicine

## 2018-02-28 DIAGNOSIS — I10 Essential (primary) hypertension: Secondary | ICD-10-CM

## 2018-02-28 DIAGNOSIS — Z9989 Dependence on other enabling machines and devices: Secondary | ICD-10-CM

## 2018-02-28 DIAGNOSIS — G4733 Obstructive sleep apnea (adult) (pediatric): Secondary | ICD-10-CM

## 2018-02-28 DIAGNOSIS — E038 Other specified hypothyroidism: Secondary | ICD-10-CM

## 2018-03-30 ENCOUNTER — Ambulatory Visit: Payer: Federal, State, Local not specified - PPO | Admitting: Adult Health

## 2018-04-26 ENCOUNTER — Telehealth: Payer: Self-pay

## 2018-04-26 ENCOUNTER — Encounter: Payer: Self-pay | Admitting: Neurology

## 2018-04-26 NOTE — Telephone Encounter (Signed)
I called Katherine Sanchez and reminded her to bring her cpap to her appt tomorrow with Dr. Rexene Alberts. I left a detailed message, per DPR, on her cell phone number.

## 2018-04-27 ENCOUNTER — Ambulatory Visit: Payer: Federal, State, Local not specified - PPO | Admitting: Neurology

## 2018-04-27 ENCOUNTER — Encounter: Payer: Self-pay | Admitting: Neurology

## 2018-04-27 VITALS — BP 121/75 | HR 64 | Ht 62.0 in | Wt 195.5 lb

## 2018-04-27 DIAGNOSIS — G4733 Obstructive sleep apnea (adult) (pediatric): Secondary | ICD-10-CM | POA: Diagnosis not present

## 2018-04-27 DIAGNOSIS — Z9989 Dependence on other enabling machines and devices: Secondary | ICD-10-CM | POA: Diagnosis not present

## 2018-04-27 NOTE — Progress Notes (Signed)
Subjective:    Patient ID: Katherine Sanchez is a 62 y.o. female.  HPI     Interim history:   Katherine Sanchez is a 62 year old right-handed woman with an underlying medical history of arthritis, chronic constipation, reflux disease, hyperlipidemia, MVP, hypertension, hypothyroidism, history of pneumonia in 2006, hypothyroidism, and obesity, who presents for follow-up consultation of her obstructive sleep apnea, on AutoPap therapy. The patient is unaccompanied today. I last saw her on 03/24/2017, at which time she was fully compliant with AutoPap therapy. She was trying to lose weight. She was advised to follow-up in one year routinely.  Today, 04/27/2018: I reviewed her AutoPap compliance data from 03/28/2018 through 04/26/2014 which is a total of 30 days, during which time she used her AutoPap every night with percent used days greater than 4 hours at 100%, indicating superb compliance with an average usage of 7 hours and 16 minutes, residual AHI 4.3 per hour, 95th percentile of pressure at 13.8 cm, leak on the higher end with the 95th percentile at 23.3 L/m on a pressure range of 5 cm to 15 cm with EPR. She reports feeling stable, AutoPap is working well for her. Has been trying to lose weight and in fact, she is lower than in several years. She feels better. Sadly, lost her mother in law recently. She reports no other changes. On Metformin long acting. A1c lowest in years, at 5.5.   The patient's allergies, current medications, family history, past medical history, past social history, past surgical history and problem list were reviewed and updated as appropriate.    Previously (copied from previous notes for reference):    I first met her on 08/05/2016 at the request of her primary care physician, at which time she reported a prior diagnosis of OSA and being on AutoPap therapy. She was compliant with treatment. She was encouraged to follow-up routinely in 6 months.   I reviewed her AutoPap  compliance data from 02/22/2017 through 03/23/2014, which is a total of 30 days, during which time she used her machine every night with percent used days greater than 4 hours at 100%, indicating superb compliance with an average usage of 7 hours and 23 minutes, residual AHI at goal at 3.7 per hour, leak acceptable for the 95th percentile at 12.7 L/m and 95th percentile pressure at 14 cm, pressure range of 5-15 with EPR.   08/05/2016: (She) was previously diagnosed with obstructive sleep apnea and placed on PAP therapy. Sleep study test results are not available for my review, original diagnosis is probably more than 15 years ago. She has been on CPAP. I reviewed her compliance data from 07/06/2016 through 08/04/2016, which is a total of 30 days, during which time she used her machine every night with percent used days greater than 4 hours of 100%, indicating superb compliance with an average usage of 6 hours and 54 minutes, residual AHI 4.2 per hour, leak acceptable at 11 L/m on a pressure range of 5-15 auto, 95th percentile pressure at 14 cm. Her Epworth sleepiness score is 10 out of 24 today, her fatigue score is 25 out of 63.   I reviewed your office note from 07/02/2016. She had recently had an episode of acute headache, had a CT head at the time and I reviewed the results from her head CT without contrast on 06/12/2016: IMPRESSION: No evidence of acute intracranial abnormality.   Mild chronic small-vessel white matter ischemic changes.    She was treated symptomatically for her headache  with Toradol and Fioricet and improved without recurrence of a similar headache. She denies any restless leg symptoms or leg twitching at night or parasomnias. She is generally in bed between 11:30 and midnight and wake up time is around 6:45-7, she usually has to take the dog out. She lives at home with her husband. 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 She has 2  grown sons area she quit smoking and quit drinking alcohol in 1986. She was never heavy drinker. She drinks one cup of coffee each morning. She has been trying to lose weight. She denies any morning headaches. She has been using a fullface mask with good tolerance. She had some recent stressors in her family and felt more exhausted but things are settling down some now. She was told that she had moderate obstructive sleep apnea when she was originally diagnosed at age 32. This is her second CPAP machine. She has no concerns about using her machine. She indicates full compliance and great results with using CPAP. In fact, she feels that she cannot sleep without it. When she first got diagnosed in her 20s, she had significant daytime somnolence and this improved tremendously when she was started on CPAP therapy.  Her Past Medical History Is Significant For: Past Medical History:  Diagnosis Date  . Abdominal distension   . Arthritis    knees   . Constipation   . Diabetes mellitus without complication ([place])   . GERD (gastroesophageal reflux disease)   . Heart murmur   . Hyperlipidemia   . Hypertension   . Hypothyroidism   . Incisional hernia   . Pneumonia    hx of 2006   . Sleep apnea    cpap x 10 yrs Dr [name]   . Thyroid disease     Her Past Surgical History Is Significant For: Past Surgical History:  Procedure Laterality Date  . APPENDECTOMY  1982  . CARDIAC CATHETERIZATION     1999 and negative  . CHOLECYSTECTOMY  2010   lap with open redo umbilical hernia repair.  Dr. [name]  . HERNIA REPAIR  [id number]   umbilical open w mesh plug. Dr. [name] and 2010 Dr [name]   . KNEE SURGERY  2006   left - arthroscopic  . VENTRAL HERNIA REPAIR  08/13/2011   Procedure: LAPAROSCOPIC VENTRAL HERNIA;  Surgeon: Adin Hector, MD;  Location: [facility];  Service: General;  Laterality: N/A;  Laparoscopic Ventral Hernia Repair with Mesh    Her Family History Is Significant For: Family History   Problem Relation Age of Onset  . Cancer Mother        colon  . Heart disease Mother 75       congestive heart failure  . COPD Mother   . Colon cancer Mother 54  . Other Father 52       car accident  . Cancer Maternal Grandmother        ovarian  . Asthma Son   . Allergies Son   . Hypertension Paternal Grandmother   . Cancer Paternal Grandfather        prostate  . Breast cancer Neg Hx     Her Social History Is Significant For: Social History   Socioeconomic History  . Marital status: Married    Spouse name: Not on file  . Number of children: Not on file  . Years of education: Not on file  . Highest education level:  Not on file  Occupational History  . 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 Financial resource strain: Not on file  . Food insecurity:    Worry: Not on file    Inability: Not on file  . Transportation needs:    Medical: Not on file    Non-medical: Not on file  Tobacco Use  . Smoking status: Former Smoker    Packs/day: 0.10    Years: 4.00    Pack years: 0.40    Types: Cigarettes    Last attempt to quit: 07/07/1978    Years since quitting: 39.8  . Smokeless tobacco: Never Used  . Tobacco comment: occassional smoker with alcohol consumption  Substance and Sexual Activity  . Alcohol use: No    Comment: quit 1979  . Drug use: No  . Sexual activity: Yes    Birth control/protection: None    Comment: number of sex partners in the last 12 months 1  Lifestyle  . Physical activity:    Days per week: Not on file    Minutes per session: Not on file  . Stress: Not on file  Relationships  . Social connections:    Talks on phone: Not on file    Gets together: Not on file    Attends religious service: Not on file    Active member of club or organization: Not on file    Attends meetings of clubs or organizations: Not on file    Relationship status: Not on file  Other Topics Concern  . Not on file  Social History Narrative   Lives with  husband and his mother is currently living with them after sustaining a fall in Oct. 2013.   Exercise walking 2 times daily    Her Allergies Are:  Allergies  Allergen Reactions  . Amoxicillin     hives hives hives  :   Her Current Medications Are:  Outpatient Encounter Medications as of 04/27/2018  Medication Sig  . amLODipine-benazepril (LOTREL) 10-20 MG capsule TAKE 1 CAPSULE BY MOUTH EVERY DAY  . Ascorbic Acid (VITAMIN C) POWD Take 1,000 mg by mouth daily.   Marland Kitchen aspirin 81 MG tablet Take 81 mg by mouth daily.  . blood glucose meter kit and supplies KIT Dispense based on patient and insurance preference. Use up to one time as directed  (FOR ICD-9 250.00, 250.01).  Marland Kitchen docusate sodium (COLACE) 100 MG capsule Take 100 mg by mouth 2 (two) times daily as needed.    . Dulaglutide (TRULICITY) 7.93 JQ/3.0SP SOPN INJECT 0.75 MG INTO THE SKIN ONCE A WEEK  . fluticasone (FLONASE) 50 MCG/ACT nasal spray USE 2 SPRAYS IN BOTH NOSTRILS EVERY DAY  . glucose blood (ONE TOUCH ULTRA TEST) test strip USE UP TO 1 TIME AS DIRECTED  . ibuprofen (ADVIL,MOTRIN) 200 MG tablet Take 200 mg by mouth every 6 (six) hours as needed.    Marland Kitchen levothyroxine (SYNTHROID, LEVOTHROID) 50 MCG tablet TAKE 1 TABLET BY MOUTH EVERY DAY BEFORE BREAKFAST  . meloxicam (MOBIC) 15 MG tablet Take 1 tablet (15 mg total) by mouth daily as needed for pain.  . metFORMIN (GLUCOPHAGE-XR) 500 MG 24 hr tablet Take 2 tablets (1,000 mg total) by mouth 2 (two) times daily with a meal.  . ONETOUCH DELICA LANCETS 23R MISC USE UP TO 1 TIME AS DIRECTED   No facility-administered encounter medications on file as of 04/27/2018.   :  Review of Systems:  Out of a complete 14 point review of systems,  all are reviewed and negative with the exception of these symptoms as listed below:  Review of Systems  Neurological:       Pt mentioned there has been no change since her last office visit. Pt stated that she uses her CPAP every night. Pt has her CPAP  machine with her.     Objective:  Neurological Exam  Physical Exam Physical Examination:   Vitals:   04/27/18 1023  BP: 121/75  Pulse: 64    General Examination: The patient is a very pleasant 62 y.o. female in no acute distress. She appears well-developed and well-nourished and well groomed.   HEENT:Normocephalic, atraumatic, pupils are equal, round and reactive to light and accommodation. She has corrective eyeglasses. Extraocular tracking is good without limitation to gaze excursion or nystagmus noted. Normal smooth pursuit is noted. Hearing is grossly intact. Face is symmetric with normal facial animation. Speech is clear with no dysarthria noted. There is no hypophonia. There is no lip, neck/head, jaw or voice tremor. Neck shows FROM. Oropharynx exam reveals: No significant changes.   Chest:Clear to auscultation without wheezing, rhonchi or crackles noted.  Heart:S1+S2+0, regular and mild systolic murmur, no rubs or gallops noted.   Abdomen:Soft, non-tender and non-distended.  Extremities:There is nopitting edema in the distal lower extremities bilaterally.   Skin: Warm and dry without trophic changes noted. There are no varicose veins.  Musculoskeletal: exam reveals no obvious joint deformities, tenderness or joint swelling or erythema.   Neurologically:  Mental status: The patient is awake, alert and oriented in all 4 spheres. Herimmediate and remote memory, attention, language skills and fund of knowledge are appropriate. There is no evidence of aphasia, agnosia, apraxia or anomia. Speech is clear with normal prosody and enunciation. Thought process is linear. Mood is normaland affect is normal.  Cranial nerves II - XII are as described above under HEENT exam.  Motor exam: Normal bulk, strength and tone is noted. There is no drift, tremor or rebound. Fine motor skills and coordination: Grossly intact.  Sensory exam: intact to light touch in the upper and lower  extremities.  Gait, station and balance: Shestands easily. No veering to one side is noted. No leaning to one side is noted. Posture is age-appropriate and stance is narrow based. Gait shows normalstride length and normalpace. No problems turning are noted.   Assessment and Plan:  In summary, ELLINORE MERCED a very pleasant 62 year old female ith an underlying medical history of arthritis, chronic constipation, reflux disease, hyperlipidemia, MVP, hypertension, hypothyroidism, history of pneumonia in 2006, hypothyroidism, and obesity, who was previously diagnosed with obstructive sleep apnea (OSA) and placed on PAP therapy. She has been compliant with her autoPAP and is commended for her treatment adherence. Her current pressure settings are adequate, leak is acceptable and pressure range is also adequate. She is encouraged to continue to work on weight loss. She may be eligible for a new machine. She has a small part inside the machine that looks a little suspicious to her in terms of multiple deposits. She is encouraged to talk to her DME company about this and they may be able to replace that part in her machine or she may be eligible for a new machine which I would be happy to order if needed. From my end of things she is advised to continue to replace her filter and her mask and headgear as well as tubing on a regular basis and she has been taking care of this routinely. She is  advised to follow-up for sleep apnea recheck in one year. I answered all her questions today and she was in agreement. I spent 25 minutes in total face-to-face time with the patient, more than 50% of which was spent in counseling and coordination of care, reviewing test results, reviewing medication and discussing or reviewing the diagnosis of OSA, its prognosis and treatment options. Pertinent laboratory and imaging test results that were available during this visit with the patient were reviewed by me and considered in my  medical decision making (see chart for details).

## 2018-04-27 NOTE — Progress Notes (Signed)
Order for autopap supplies sent to [facility]

## 2018-04-27 NOTE — Patient Instructions (Signed)
Keep up the good work with your weight.  We will see you back in one year.

## 2018-05-02 ENCOUNTER — Other Ambulatory Visit: Payer: Self-pay | Admitting: Family Medicine

## 2018-05-14 IMAGING — CT CT HEAD W/O CM
3 series · 15 of 47 positions shown, 18 images · non-contrast
Comparison: None.

CLINICAL DATA: 59-year-old female with frontal headache for 1 day.

EXAM:
CT HEAD WITHOUT CONTRAST
TECHNIQUE: Contiguous axial images were obtained from the base of the skull
through the vertex without intravenous contrast.

[Series 2: head wo · axial · 0.45mm/px · z∈[-190,-60]mm · 9 of 32 slices shown, 12 images]
[im 3/32  brain]
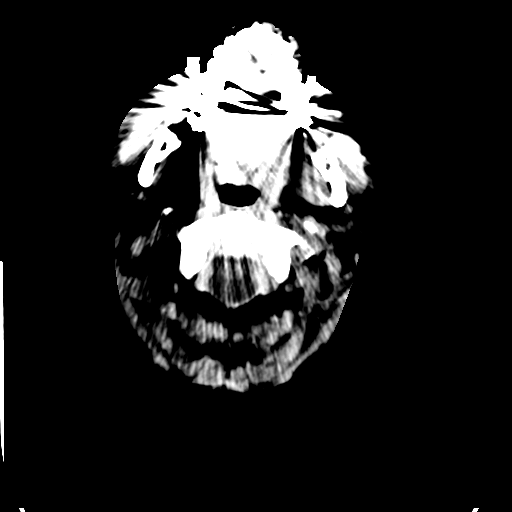
[im 3/32  bone]
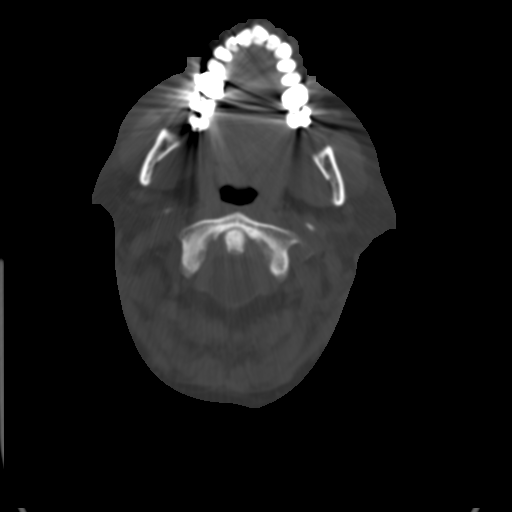
[im 6/32  brain]
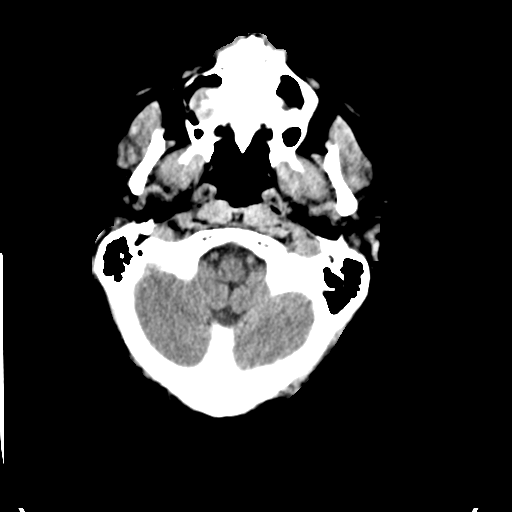
[im 9/32  brain]
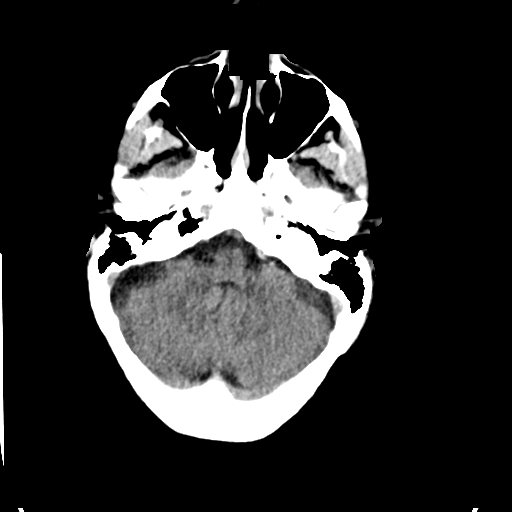
[im 12/32  brain]
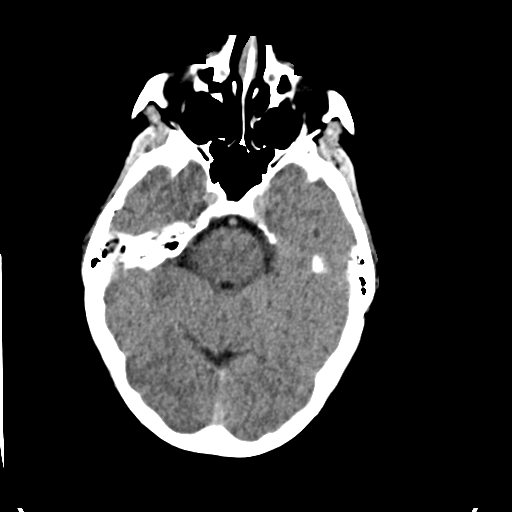
[im 17/32  brain]
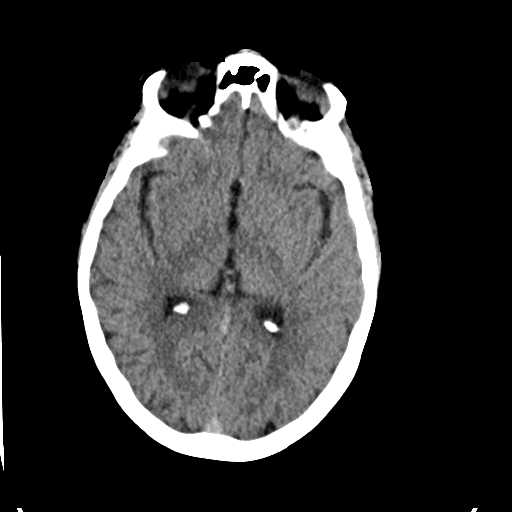
[im 17/32  bone]
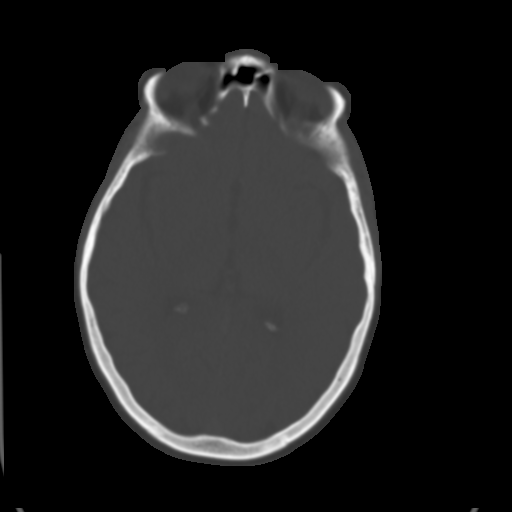
[im 20/32  brain]
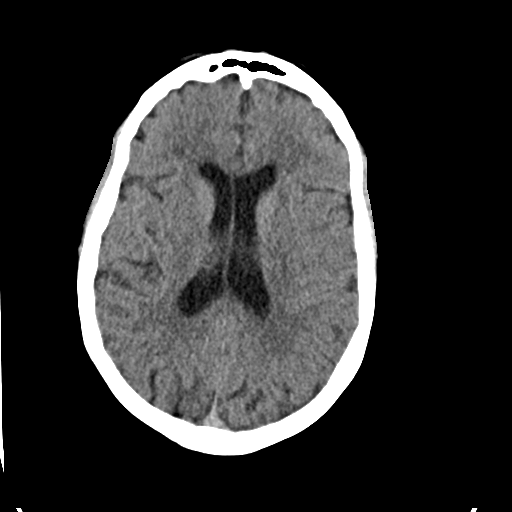
[im 23/32  brain]
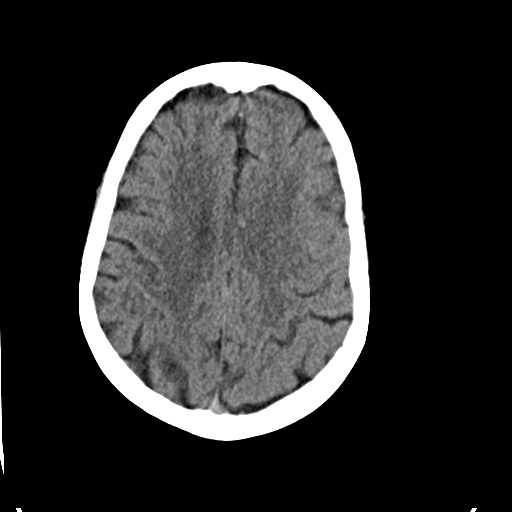
[im 26/32  brain]
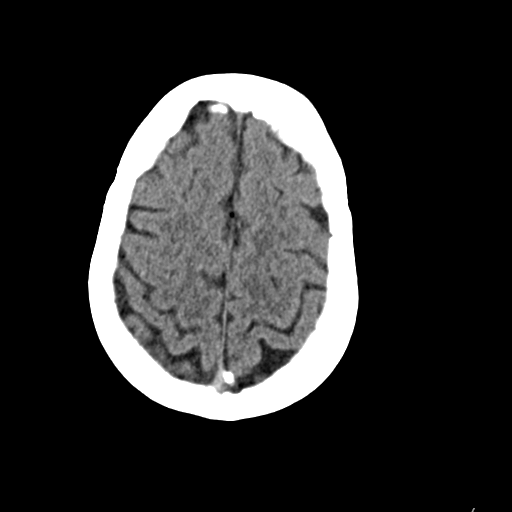
[im 29/32  brain]
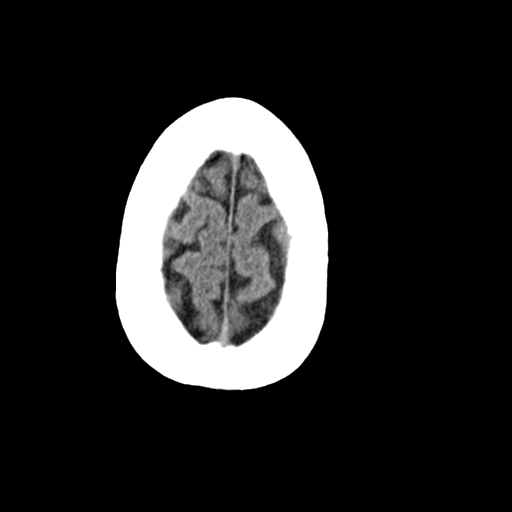
[im 29/32  bone]
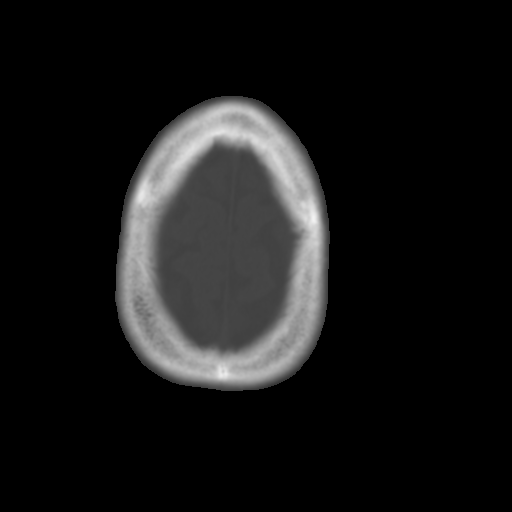

[Series 4: coronal soft · coronal · 0.34mm/px · 3 of 67 slices shown]
[im 23/67  brain]
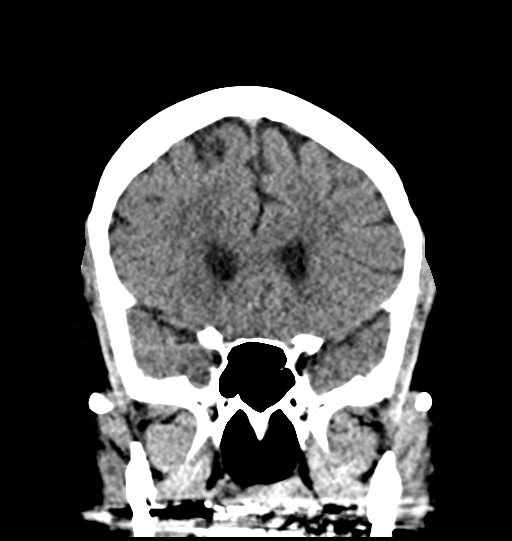
[im 30/67  brain]
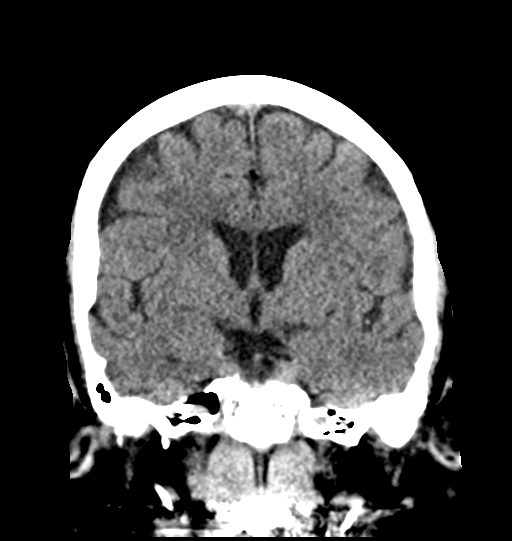
[im 37/67  brain]
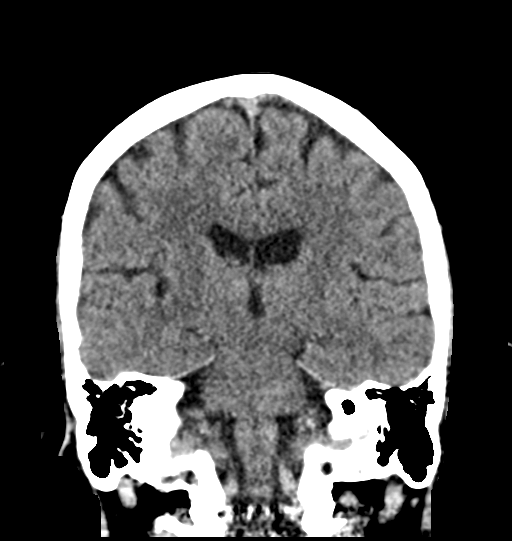

[Series 5: sag soft · sagittal · 0.35mm/px · 3 of 51 slices shown]
[im 17/51  brain]
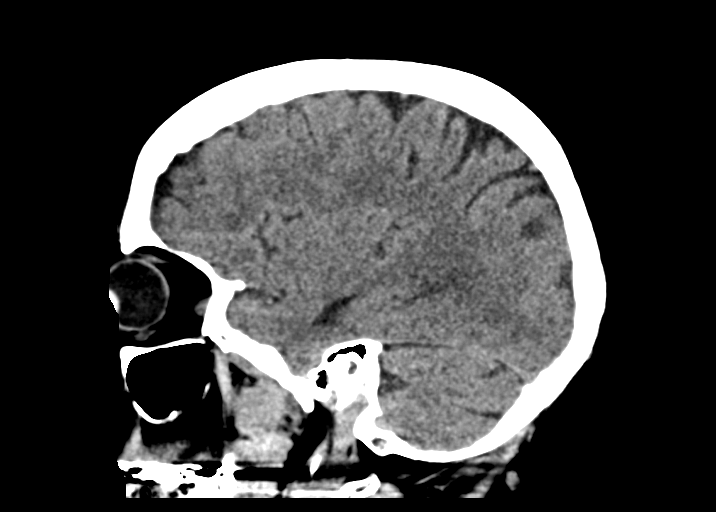
[im 26/51  brain]
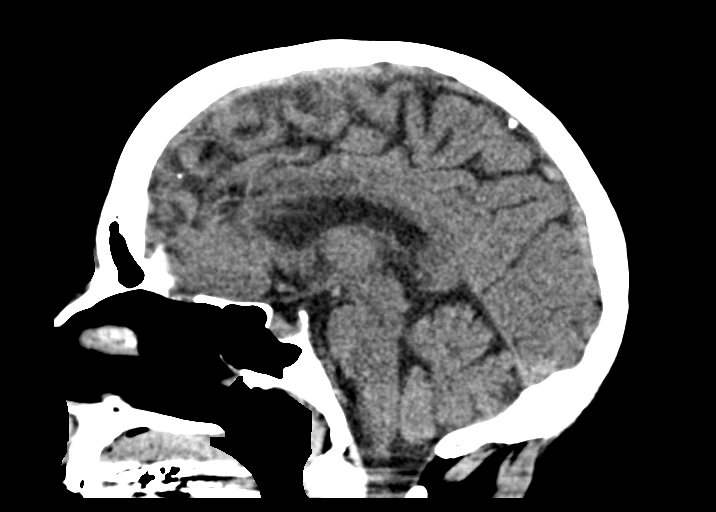
[im 34/51  brain]
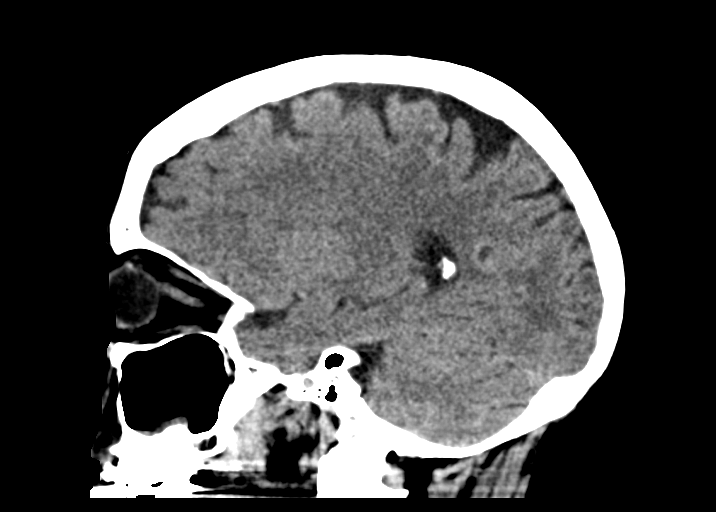

[15 of 47 positions shown; findings below may reference images not displayed]

FINDINGS: Brain: No evidence of acute infarction, hemorrhage, hydrocephalus,
extra-axial collection or mass lesion/mass effect.

Mild chronic small-vessel white matter ischemic changes are noted.

Vascular: No hyperdense vessel or unexpected calcification.

Skull: Normal. Negative for fracture or focal lesion.

Sinuses/Orbits: No acute finding.

Other: None.
IMPRESSION: No evidence of acute intracranial abnormality.

Mild chronic small-vessel white matter ischemic changes.

## 2018-05-23 ENCOUNTER — Other Ambulatory Visit: Payer: Self-pay | Admitting: Family Medicine

## 2018-06-17 ENCOUNTER — Other Ambulatory Visit: Payer: Self-pay | Admitting: Family Medicine

## 2018-06-17 NOTE — Telephone Encounter (Signed)
30 day courtesy refill given until appt Requested Prescriptions  Pending Prescriptions Disp Refills  . fluticasone (FLONASE) 50 MCG/ACT nasal spray [Pharmacy Med Name: FLUTICASONE PROP 50 MCG SPRAY] 16 g 0    Sig: USE 2 SPRAYS IN BOTH NOSTRILS EVERY DAY     Ear, Nose, and Throat: Nasal Preparations - Corticosteroids Passed - 06/17/2018  1:35 PM      Passed - Valid encounter within last 12 months    Recent Outpatient Visits          3 months ago Type 2 diabetes mellitus without complication, with long-term current use of insulin ([place])   [facility], Laurey Arrow, MD   11 months ago Annual physical exam   [facility], Laurey Arrow, MD   12 months ago Type 2 diabetes mellitus without complication, with long-term current use of insulin [facility])   [facility], Laurey Arrow, MD   1 year ago Medication monitoring encounter   [facility], Laurey Arrow, MD   1 year ago Annual physical exam   [facility] at [facility], Laurey Arrow, MD      Future Appointments            In 1 month Brigitte Pulse Laurey Arrow, MD [facility] [place], [facility]

## 2018-07-13 ENCOUNTER — Other Ambulatory Visit: Payer: Self-pay | Admitting: Family Medicine

## 2018-07-22 ENCOUNTER — Telehealth: Payer: Self-pay | Admitting: Family Medicine

## 2018-07-22 NOTE — Telephone Encounter (Signed)
MyChart message sent to patient about rescheduling their appt on 08/25/18 AT 9:30AM

## 2018-07-25 ENCOUNTER — Encounter: Payer: Federal, State, Local not specified - PPO | Admitting: Family Medicine

## 2018-07-26 LAB — HM DIABETES EYE EXAM

## 2018-08-25 ENCOUNTER — Encounter: Payer: Federal, State, Local not specified - PPO | Admitting: Family Medicine

## 2018-08-29 ENCOUNTER — Telehealth: Payer: Self-pay | Admitting: Family Medicine

## 2018-08-30 ENCOUNTER — Encounter: Payer: Federal, State, Local not specified - PPO | Admitting: Family Medicine

## 2018-09-02 ENCOUNTER — Other Ambulatory Visit: Payer: Self-pay | Admitting: Family Medicine

## 2018-09-02 DIAGNOSIS — I10 Essential (primary) hypertension: Secondary | ICD-10-CM

## 2018-09-02 DIAGNOSIS — E038 Other specified hypothyroidism: Secondary | ICD-10-CM

## 2018-09-02 DIAGNOSIS — G4733 Obstructive sleep apnea (adult) (pediatric): Secondary | ICD-10-CM

## 2018-09-02 DIAGNOSIS — Z9989 Dependence on other enabling machines and devices: Secondary | ICD-10-CM

## 2018-09-15 ENCOUNTER — Telehealth: Payer: Self-pay | Admitting: Family Medicine

## 2018-09-15 NOTE — Telephone Encounter (Signed)
LVM for pt to call the office and reschedule appt that was scheduled with Dr. Shaw on 09/20/18. Due to Dr. Shaw being out on emergency leave, pt can either schedule with a different provider or they may schedule with D. Shaw in mid March. When pt calls back, please schedule accordingly. Thank you! °

## 2018-09-20 ENCOUNTER — Encounter: Payer: Federal, State, Local not specified - PPO | Admitting: Family Medicine

## 2018-09-21 ENCOUNTER — Encounter: Payer: Self-pay | Admitting: Emergency Medicine

## 2018-09-21 ENCOUNTER — Other Ambulatory Visit: Payer: Self-pay

## 2018-09-21 ENCOUNTER — Ambulatory Visit (INDEPENDENT_AMBULATORY_CARE_PROVIDER_SITE_OTHER): Payer: Federal, State, Local not specified - PPO | Admitting: Emergency Medicine

## 2018-09-21 VITALS — BP 115/77 | HR 60 | Temp 98.2°F | Resp 16 | Ht 62.25 in | Wt 201.4 lb

## 2018-09-21 DIAGNOSIS — E119 Type 2 diabetes mellitus without complications: Secondary | ICD-10-CM

## 2018-09-21 DIAGNOSIS — Z23 Encounter for immunization: Secondary | ICD-10-CM | POA: Diagnosis not present

## 2018-09-21 DIAGNOSIS — G4733 Obstructive sleep apnea (adult) (pediatric): Secondary | ICD-10-CM | POA: Diagnosis not present

## 2018-09-21 DIAGNOSIS — Z130 Encounter for screening for diseases of the blood and blood-forming organs and certain disorders involving the immune mechanism: Secondary | ICD-10-CM

## 2018-09-21 DIAGNOSIS — I10 Essential (primary) hypertension: Secondary | ICD-10-CM

## 2018-09-21 DIAGNOSIS — Z794 Long term (current) use of insulin: Secondary | ICD-10-CM | POA: Diagnosis not present

## 2018-09-21 DIAGNOSIS — Z13228 Encounter for screening for other metabolic disorders: Secondary | ICD-10-CM

## 2018-09-21 DIAGNOSIS — Z1329 Encounter for screening for other suspected endocrine disorder: Secondary | ICD-10-CM

## 2018-09-21 DIAGNOSIS — Z0000 Encounter for general adult medical examination without abnormal findings: Secondary | ICD-10-CM

## 2018-09-21 DIAGNOSIS — Z0001 Encounter for general adult medical examination with abnormal findings: Secondary | ICD-10-CM

## 2018-09-21 NOTE — Progress Notes (Signed)
Lab Results  Component Value Date   HGBA1C 5.5 02/22/2018   BP Readings from Last 3 Encounters:  04/27/18 121/75  02/22/18 126/79  07/07/17 118/73   Wt Readings from Last 3 Encounters:  04/27/18 195 lb 8 oz (88.7 kg)  02/22/18 195 lb 6.4 oz (88.6 kg)  07/07/17 207 lb 3.2 oz (94 kg)   Lab Results  Component Value Date   CHOL 168 07/07/2017   HDL 46 07/07/2017   LDLCALC 100 (H) 07/07/2017   TRIG 111 07/07/2017   CHOLHDL 3.7 07/07/2017   Katherine Sanchez 63 y.o.   Chief Complaint  Patient presents with  . Annual Exam    HISTORY OF PRESENT ILLNESS: This is a 63 y.o. female here for her annual exam. Patient has a history of diabetes, hypertension obstructive sleep apnea, thyroid condition.  On multiple medications.  Doing well.  Has no complaints or medical concerns. Colonoscopy done 2018. Up-to-date with mammogram and Pap smear. HPI   Prior to Admission medications   Medication Sig Start Date End Date Taking? Authorizing Provider  amLODipine-benazepril (LOTREL) 10-20 MG capsule TAKE 1 CAPSULE BY MOUTH EVERY DAY 05/03/18  Yes Shawnee Knapp, MD  Ascorbic Acid (VITAMIN C) POWD Take 1,000 mg by mouth daily.    Yes [provider]  aspirin 81 MG tablet Take 81 mg by mouth daily.   Yes [provider]  docusate sodium (COLACE) 100 MG capsule Take 100 mg by mouth 2 (two) times daily as needed.     Yes [provider]  Dulaglutide (TRULICITY) 7.41 OI/7.8MV SOPN INJECT 0.75 MG INTO THE SKIN ONCE A WEEK 02/22/18  Yes Shawnee Knapp, MD  fluticasone [facility]) 50 MCG/ACT nasal spray USE 2 SPRAYS IN BOTH NOSTRILS EVERY DAY 07/13/18  Yes Shawnee Knapp, MD  ibuprofen (ADVIL,MOTRIN) 200 MG tablet Take 200 mg by mouth every 6 (six) hours as needed.     Yes [provider]  levothyroxine (SYNTHROID, LEVOTHROID) 50 MCG tablet TAKE 1 TABLET BY MOUTH EVERY DAY BEFORE BREAKFAST 09/02/18  Yes Shawnee Knapp, MD  meloxicam (MOBIC) 15 MG tablet Take 1 tablet (15 mg total)  by mouth daily as needed for pain. 08/05/16  Yes Shawnee Knapp, MD  metFORMIN (GLUCOPHAGE-XR) 500 MG 24 hr tablet Take 2 tablets (1,000 mg total) by mouth 2 (two) times daily with a meal. 02/22/18  Yes Shawnee Knapp, MD  [facility] DELICA LANCETS 67M MISC USE UP TO 1 TIME AS DIRECTED 07/01/17  Yes Shawnee Knapp, MD  blood glucose meter kit and supplies KIT Dispense based on patient and insurance preference. Use up to one time as directed  (FOR ICD-9 250.00, 250.01). 03/24/15   [name], [name], DO  glucose blood (ONE TOUCH ULTRA TEST) test strip USE UP TO 1 TIME AS DIRECTED 05/23/18   Shawnee Knapp, MD    Allergies  Allergen Reactions  . Amoxicillin     hives hives hives    Patient Active Problem List   Diagnosis Date Noted  . Vitamin B12 deficiency 02/22/2018  . Type 2 diabetes mellitus without complication, with long-term current use of insulin ([place]) 06/22/2017  . Acquired hypothyroidism 06/22/2017  . OSA (obstructive sleep apnea) 06/28/2013  . Obesity (BMI 30-39.9) 07/08/2011  . Essential hypertension, benign 10/17/2007  . Chronic ischemic heart disease 10/17/2007  . Recurrent ventral incisional hernia, periumbilical 09/47/0962  . Osteoarthritis 10/14/2007  . ABNORMAL GLUCOSE NEC 10/14/2007    Past Medical History:  Diagnosis  Date  . Abdominal distension   . Arthritis    knees   . Constipation   . Diabetes mellitus without complication ([place])   . GERD (gastroesophageal reflux disease)   . Heart murmur   . Hyperlipidemia   . Hypertension   . Hypothyroidism   . Incisional hernia   . Pneumonia    hx of 2006   . Sleep apnea    cpap x 10 yrs Dr [name]   . Thyroid disease     Past Surgical History:  Procedure Laterality Date  . APPENDECTOMY  1982  . CARDIAC CATHETERIZATION     1999 and negative  . CHOLECYSTECTOMY  2010   lap with open redo umbilical hernia repair.  Dr. [name]  . HERNIA REPAIR  [id number]   umbilical open w mesh plug. Dr. [name] and 2010 Dr [name]   . KNEE SURGERY   2006   left - arthroscopic  . VENTRAL HERNIA REPAIR  08/13/2011   Procedure: LAPAROSCOPIC VENTRAL HERNIA;  Surgeon: Adin Hector, MD;  Location: [facility];  Service: General;  Laterality: N/A;  Laparoscopic Ventral Hernia Repair with Mesh    Social History   Socioeconomic History  . Marital status: Married    Spouse name: Not on file  . Number of children: Not on file  . Years of education: Not on file  . Highest education level: Not on file  Occupational History  . 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 Financial resource strain: Not on file  . Food insecurity:    Worry: Not on file    Inability: Not on file  . Transportation needs:    Medical: Not on file    Non-medical: Not on file  Tobacco Use  . Smoking status: Former Smoker    Packs/day: 0.10    Years: 4.00    Pack years: 0.40    Types: Cigarettes    Last attempt to quit: 07/07/1978    Years since quitting: 40.2  . Smokeless tobacco: Never Used  . Tobacco comment: occassional smoker with alcohol consumption  Substance and Sexual Activity  . Alcohol use: No    Comment: quit 1979  . Drug use: No  . Sexual activity: Yes    Birth control/protection: None    Comment: number of sex partners in the last 12 months 1  Lifestyle  . Physical activity:    Days per week: Not on file    Minutes per session: Not on file  . Stress: Not on file  Relationships  . Social connections:    Talks on phone: Not on file    Gets together: Not on file    Attends religious service: Not on file    Active member of club or organization: Not on file    Attends meetings of clubs or organizations: Not on file    Relationship status: Not on file  . Intimate partner violence:    Fear of current or ex partner: Not on file    Emotionally abused: Not on file    Physically abused: Not on file    Forced sexual activity: Not on file  Other Topics Concern  . Not on file  Social History Narrative   Lives with husband and  his mother is currently living with them after sustaining a fall in Oct. 2013.   Exercise walking 2 times daily    Family History  Problem Relation Age of Onset  . Cancer Mother  colon  . Heart disease Mother 13       congestive heart failure  . COPD Mother   . Colon cancer Mother 52  . Other Father 66       car accident  . Cancer Maternal Grandmother        ovarian  . Asthma Son   . Allergies Son   . Hypertension Paternal Grandmother   . Cancer Paternal Grandfather        prostate  . Breast cancer Neg Hx      Review of Systems  Constitutional: Negative.  Negative for chills and fever.  HENT: Negative.  Negative for congestion, hearing loss, nosebleeds and sore throat.   Eyes: Negative.  Negative for blurred vision and double vision.  Respiratory: Negative.  Negative for cough.   Cardiovascular: Negative.  Negative for chest pain and palpitations.  Gastrointestinal: Negative for abdominal pain, blood in stool, diarrhea, nausea and vomiting.  Genitourinary: Negative.  Negative for hematuria.  Musculoskeletal: Negative.  Negative for back pain, myalgias and neck pain.  Skin: Negative.  Negative for rash.  Neurological: Negative for dizziness and headaches.  Endo/Heme/Allergies: Negative.   All other systems reviewed and are negative.   Vitals:   09/21/18 1337  BP: 115/77  Pulse: 60  Resp: 16  Temp: 98.2 F (36.8 C)  SpO2: 97%    Physical Exam Constitutional:      Appearance: She is obese.  HENT:     Head: Normocephalic and atraumatic.     Nose: Nose normal.     Mouth/Throat:     Mouth: Mucous membranes are dry.  Eyes:     Extraocular Movements: Extraocular movements intact.     Conjunctiva/sclera: Conjunctivae normal.     Pupils: Pupils are equal, round, and reactive to light.  Neck:     Musculoskeletal: Normal range of motion and neck supple.  Cardiovascular:     Rate and Rhythm: Normal rate and regular rhythm.     Heart sounds: Murmur present.  Systolic murmur present with a grade of 3/6.  Pulmonary:     Effort: Pulmonary effort is normal.     Breath sounds: Normal breath sounds.  Abdominal:     General: There is no distension.     Palpations: Abdomen is soft.     Tenderness: There is no abdominal tenderness.     Comments: Status post umbilical hernia repair in the past  Musculoskeletal: Normal range of motion.  Skin:    General: Skin is warm and dry.  Neurological:     General: No focal deficit present.     Mental Status: She is alert and oriented to person, place, and time.  Psychiatric:        Mood and Affect: Mood normal.        Behavior: Behavior normal.      ASSESSMENT & PLAN: Katherine Sanchez was seen today for annual exam.  Diagnoses and all orders for this visit:  Routine general medical examination at a health care facility  Type 2 diabetes mellitus without complication, with long-term current use of insulin ([place]) -     Comprehensive metabolic panel -     Hemoglobin A1c -     Lipid panel -     HM Diabetes Foot Exam  Essential hypertension, benign -     Comprehensive metabolic panel  OSA (obstructive sleep apnea)  Screening for deficiency anemia -     CBC with Differential  Screening for endocrine, metabolic and immunity disorder -  Lipid panel  Need for prophylactic vaccination and inoculation against influenza -     Flu Vaccine QUAD 36+ mos IM   Patient Instructions       If you have lab work done today you will be contacted with your lab results within the next 2 weeks.  If you have not heard from [place] then please contact us. The fastest way to get your results is to register for My Chart.   IF you received an x-ray today, you will receive an invoice from [facility]. Please contact [facility] at [phone] with questions or concerns regarding your invoice.   IF you received labwork today, you will receive an invoice from [facility]. Please contact [facility] at [phone]  with questions or concerns regarding your invoice.   Our billing staff will not be able to assist you with questions regarding bills from these companies.  You will be contacted with the lab results as soon as they are available. The fastest way to get your results is to activate your My Chart account. Instructions are located on the last page of this paperwork. If you have not heard from [place] regarding the results in 2 weeks, please contact this office.     Health Maintenance, Female Adopting a healthy lifestyle and getting preventive care can go a long way to promote health and wellness. Talk with your health care provider about what schedule of regular examinations is right for you. This is a good chance for you to check in with your provider about disease prevention and staying healthy. In between checkups, there are plenty of things you can do on your own. Experts have done a lot of research about which lifestyle changes and preventive measures are most likely to keep you healthy. Ask your health care provider for more information. Weight and diet Eat a healthy diet  Be sure to include plenty of vegetables, fruits, low-fat dairy products, and lean protein.  Do not eat a lot of foods high in solid fats, added sugars, or salt.  Get regular exercise. This is one of the most important things you can do for your health. ? Most adults should exercise for at least 150 minutes each week. The exercise should increase your heart rate and make you sweat (moderate-intensity exercise). ? Most adults should also do strengthening exercises at least twice a week. This is in addition to the moderate-intensity exercise. Maintain a healthy weight  Body mass index (BMI) is a measurement that can be used to identify possible weight problems. It estimates body fat based on height and weight. Your health care provider can help determine your BMI and help you achieve or maintain a healthy weight.  For females 45  years of age and older: ? A BMI below 18.5 is considered underweight. ? A BMI of 18.5 to 24.9 is normal. ? A BMI of 25 to 29.9 is considered overweight. ? A BMI of 30 and above is considered obese. Watch levels of cholesterol and blood lipids  You should start having your blood tested for lipids and cholesterol at 63 years of age, then have this test every 5 years.  You may need to have your cholesterol levels checked more often if: ? Your lipid or cholesterol levels are high. ? You are older than 63 years of age. ? You are at high risk for heart disease. Cancer screening Lung Cancer  Lung cancer screening is recommended for adults 67-54 years old who are at high risk for lung  cancer because of a history of smoking.  A yearly low-dose CT scan of the lungs is recommended for people who: ? Currently smoke. ? Have quit within the past 15 years. ? Have at least a 30-pack-year history of smoking. A pack year is smoking an average of one pack of cigarettes a day for 1 year.  Yearly screening should continue until it has been 15 years since you quit.  Yearly screening should stop if you develop a health problem that would prevent you from having lung cancer treatment. Breast Cancer  Practice breast self-awareness. This means understanding how your breasts normally appear and feel.  It also means doing regular breast self-exams. Let your health care provider know about any changes, no matter how small.  If you are in your 20s or 30s, you should have a clinical breast exam (CBE) by a health care provider every 1-3 years as part of a regular health exam.  If you are 41 or older, have a CBE every year. Also consider having a breast X-ray (mammogram) every year.  If you have a family history of breast cancer, talk to your health care provider about genetic screening.  If you are at high risk for breast cancer, talk to your health care provider about having an MRI and a mammogram every  year.  Breast cancer gene (BRCA) assessment is recommended for women who have family members with BRCA-related cancers. BRCA-related cancers include: ? Breast. ? Ovarian. ? Tubal. ? Peritoneal cancers.  Results of the assessment will determine the need for genetic counseling and BRCA1 and BRCA2 testing. Cervical Cancer Your health care provider may recommend that you be screened regularly for cancer of the pelvic organs (ovaries, uterus, and vagina). This screening involves a pelvic examination, including checking for microscopic changes to the surface of your cervix (Pap test). You may be encouraged to have this screening done every 3 years, beginning at age 24.  For women ages 89-65, health care providers may recommend pelvic exams and Pap testing every 3 years, or they may recommend the Pap and pelvic exam, combined with testing for human papilloma virus (HPV), every 5 years. Some types of HPV increase your risk of cervical cancer. Testing for HPV may also be done on women of any age with unclear Pap test results.  Other health care providers may not recommend any screening for nonpregnant women who are considered low risk for pelvic cancer and who do not have symptoms. Ask your health care provider if a screening pelvic exam is right for you.  If you have had past treatment for cervical cancer or a condition that could lead to cancer, you need Pap tests and screening for cancer for at least 20 years after your treatment. If Pap tests have been discontinued, your risk factors (such as having a new sexual partner) need to be reassessed to determine if screening should resume. Some women have medical problems that increase the chance of getting cervical cancer. In these cases, your health care provider may recommend more frequent screening and Pap tests. Colorectal Cancer  This type of cancer can be detected and often prevented.  Routine colorectal cancer screening usually begins at 63 years of  age and continues through 63 years of age.  Your health care provider may recommend screening at an earlier age if you have risk factors for colon cancer.  Your health care provider may also recommend using home test kits to check for hidden blood in the stool.  A  small camera at the end of a tube can be used to examine your colon directly (sigmoidoscopy or colonoscopy). This is done to check for the earliest forms of colorectal cancer.  Routine screening usually begins at age 28.  Direct examination of the colon should be repeated every 5-10 years through 63 years of age. However, you may need to be screened more often if early forms of precancerous polyps or small growths are found. Skin Cancer  Check your skin from head to toe regularly.  Tell your health care provider about any new moles or changes in moles, especially if there is a change in a mole's shape or color.  Also tell your health care provider if you have a mole that is larger than the size of a pencil eraser.  Always use sunscreen. Apply sunscreen liberally and repeatedly throughout the day.  Protect yourself by wearing long sleeves, pants, a wide-brimmed hat, and sunglasses whenever you are outside. Heart disease, diabetes, and high blood pressure  High blood pressure causes heart disease and increases the risk of stroke. High blood pressure is more likely to develop in: ? People who have blood pressure in the high end of the normal range (130-139/85-89 mm Hg). ? People who are overweight or obese. ? People who are African American.  If you are 65-66 years of age, have your blood pressure checked every 3-5 years. If you are 9 years of age or older, have your blood pressure checked every year. You should have your blood pressure measured twice-once when you are at a hospital or clinic, and once when you are not at a hospital or clinic. Record the average of the two measurements. To check your blood pressure when you are  not at a hospital or clinic, you can use: ? An automated blood pressure machine at a pharmacy. ? A home blood pressure monitor.  If you are between 60 years and 7 years old, ask your health care provider if you should take aspirin to prevent strokes.  Have regular diabetes screenings. This involves taking a blood sample to check your fasting blood sugar level. ? If you are at a normal weight and have a low risk for diabetes, have this test once every three years after 63 years of age. ? If you are overweight and have a high risk for diabetes, consider being tested at a younger age or more often. Preventing infection Hepatitis B  If you have a higher risk for hepatitis B, you should be screened for this virus. You are considered at high risk for hepatitis B if: ? You were born in a country where hepatitis B is common. Ask your health care provider which countries are considered high risk. ? Your parents were born in a high-risk country, and you have not been immunized against hepatitis B (hepatitis B vaccine). ? You have HIV or AIDS. ? You use needles to inject street drugs. ? You live with someone who has hepatitis B. ? You have had sex with someone who has hepatitis B. ? You get hemodialysis treatment. ? You take certain medicines for conditions, including cancer, organ transplantation, and autoimmune conditions. Hepatitis C  Blood testing is recommended for: ? Everyone born from [birth year] through 1965. ? Anyone with known risk factors for hepatitis C. Sexually transmitted infections (STIs)  You should be screened for sexually transmitted infections (STIs) including gonorrhea and chlamydia if: ? You are sexually active and are younger than 63 years of age. ? You are  older than 63 years of age and your health care provider tells you that you are at risk for this type of infection. ? Your sexual activity has changed since you were last screened and you are at an increased risk for chlamydia  or gonorrhea. Ask your health care provider if you are at risk.  If you do not have HIV, but are at risk, it may be recommended that you take a prescription medicine daily to prevent HIV infection. This is called pre-exposure prophylaxis (PrEP). You are considered at risk if: ? You are sexually active and do not regularly use condoms or know the HIV status of your partner(s). ? You take drugs by injection. ? You are sexually active with a partner who has HIV. Talk with your health care provider about whether you are at high risk of being infected with HIV. If you choose to begin PrEP, you should first be tested for HIV. You should then be tested every 3 months for as long as you are taking PrEP. Pregnancy  If you are premenopausal and you may become pregnant, ask your health care provider about preconception counseling.  If you may become pregnant, take 400 to 800 micrograms (mcg) of folic acid every day.  If you want to prevent pregnancy, talk to your health care provider about birth control (contraception). Osteoporosis and menopause  Osteoporosis is a disease in which the bones lose minerals and strength with aging. This can result in serious bone fractures. Your risk for osteoporosis can be identified using a bone density scan.  If you are 50 years of age or older, or if you are at risk for osteoporosis and fractures, ask your health care provider if you should be screened.  Ask your health care provider whether you should take a calcium or vitamin D supplement to lower your risk for osteoporosis.  Menopause may have certain physical symptoms and risks.  Hormone replacement therapy may reduce some of these symptoms and risks. Talk to your health care provider about whether hormone replacement therapy is right for you. Follow these instructions at home:  Schedule regular health, dental, and eye exams.  Stay current with your immunizations.  Do not use any tobacco products including  cigarettes, chewing tobacco, or electronic cigarettes.  If you are pregnant, do not drink alcohol.  If you are breastfeeding, limit how much and how often you drink alcohol.  Limit alcohol intake to no more than 1 drink per day for nonpregnant women. One drink equals 12 ounces of beer, 5 ounces of wine, or 1 ounces of hard liquor.  Do not use street drugs.  Do not share needles.  Ask your health care provider for help if you need support or information about quitting drugs.  Tell your health care provider if you often feel depressed.  Tell your health care provider if you have ever been abused or do not feel safe at home. This information is not intended to replace advice given to you by your health care provider. Make sure you discuss any questions you have with your health care provider. Document Released: 02/23/2011 Document Revised: 01/16/2016 Document Reviewed: 05/14/2015 Elsevier Interactive Patient Education  2019 [employer]      Agustina Caroli, MD [facility]

## 2018-09-21 NOTE — Patient Instructions (Addendum)
   If you have lab work done today you will be contacted with your lab results within the next 2 weeks.  If you have not heard from us then please contact us. The fastest way to get your results is to register for My Chart.   IF you received an x-ray today, you will receive an invoice from [facility]. Please contact [facility] at [phone] with questions or concerns regarding your invoice.   IF you received labwork today, you will receive an invoice from [facility]. Please contact [facility] at [phone] with questions or concerns regarding your invoice.   Our billing staff will not be able to assist you with questions regarding bills from these companies.  You will be contacted with the lab results as soon as they are available. The fastest way to get your results is to activate your My Chart account. Instructions are located on the last page of this paperwork. If you have not heard from us regarding the results in 2 weeks, please contact this office.    Health Maintenance, Female Adopting a healthy lifestyle and getting preventive care can go a long way to promote health and wellness. Talk with your health care provider about what schedule of regular examinations is right for you. This is a good chance for you to check in with your provider about disease prevention and staying healthy. In between checkups, there are plenty of things you can do on your own. Experts have done a lot of research about which lifestyle changes and preventive measures are most likely to keep you healthy. Ask your health care provider for more information. Weight and diet Eat a healthy diet  Be sure to include plenty of vegetables, fruits, low-fat dairy products, and lean protein.  Do not eat a lot of foods high in solid fats, added sugars, or salt.  Get regular exercise. This is one of the most important things you can do for your health. ? Most adults should exercise for at least 150  minutes each week. The exercise should increase your heart rate and make you sweat (moderate-intensity exercise). ? Most adults should also do strengthening exercises at least twice a week. This is in addition to the moderate-intensity exercise. Maintain a healthy weight  Body mass index (BMI) is a measurement that can be used to identify possible weight problems. It estimates body fat based on height and weight. Your health care provider can help determine your BMI and help you achieve or maintain a healthy weight.  For females 20 years of age and older: ? A BMI below 18.5 is considered underweight. ? A BMI of 18.5 to 24.9 is normal. ? A BMI of 25 to 29.9 is considered overweight. ? A BMI of 30 and above is considered obese. Watch levels of cholesterol and blood lipids  You should start having your blood tested for lipids and cholesterol at 63 years of age, then have this test every 5 years.  You may need to have your cholesterol levels checked more often if: ? Your lipid or cholesterol levels are high. ? You are older than 63 years of age. ? You are at high risk for heart disease. Cancer screening Lung Cancer  Lung cancer screening is recommended for adults 55-80 years old who are at high risk for lung cancer because of a history of smoking.  A yearly low-dose CT scan of the lungs is recommended for people who: ? Currently smoke. ? Have quit within the past 15   years. ? Have at least a 30-pack-year history of smoking. A pack year is smoking an average of one pack of cigarettes a day for 1 year.  Yearly screening should continue until it has been 15 years since you quit.  Yearly screening should stop if you develop a health problem that would prevent you from having lung cancer treatment. Breast Cancer  Practice breast self-awareness. This means understanding how your breasts normally appear and feel.  It also means doing regular breast self-exams. Let your health care provider  know about any changes, no matter how small.  If you are in your 20s or 30s, you should have a clinical breast exam (CBE) by a health care provider every 1-3 years as part of a regular health exam.  If you are 40 or older, have a CBE every year. Also consider having a breast X-ray (mammogram) every year.  If you have a family history of breast cancer, talk to your health care provider about genetic screening.  If you are at high risk for breast cancer, talk to your health care provider about having an MRI and a mammogram every year.  Breast cancer gene (BRCA) assessment is recommended for women who have family members with BRCA-related cancers. BRCA-related cancers include: ? Breast. ? Ovarian. ? Tubal. ? Peritoneal cancers.  Results of the assessment will determine the need for genetic counseling and BRCA1 and BRCA2 testing. Cervical Cancer Your health care provider may recommend that you be screened regularly for cancer of the pelvic organs (ovaries, uterus, and vagina). This screening involves a pelvic examination, including checking for microscopic changes to the surface of your cervix (Pap test). You may be encouraged to have this screening done every 3 years, beginning at age 21.  For women ages 30-65, health care providers may recommend pelvic exams and Pap testing every 3 years, or they may recommend the Pap and pelvic exam, combined with testing for human papilloma virus (HPV), every 5 years. Some types of HPV increase your risk of cervical cancer. Testing for HPV may also be done on women of any age with unclear Pap test results.  Other health care providers may not recommend any screening for nonpregnant women who are considered low risk for pelvic cancer and who do not have symptoms. Ask your health care provider if a screening pelvic exam is right for you.  If you have had past treatment for cervical cancer or a condition that could lead to cancer, you need Pap tests and  screening for cancer for at least 20 years after your treatment. If Pap tests have been discontinued, your risk factors (such as having a new sexual partner) need to be reassessed to determine if screening should resume. Some women have medical problems that increase the chance of getting cervical cancer. In these cases, your health care provider may recommend more frequent screening and Pap tests. Colorectal Cancer  This type of cancer can be detected and often prevented.  Routine colorectal cancer screening usually begins at 63 years of age and continues through 63 years of age.  Your health care provider may recommend screening at an earlier age if you have risk factors for colon cancer.  Your health care provider may also recommend using home test kits to check for hidden blood in the stool.  A small camera at the end of a tube can be used to examine your colon directly (sigmoidoscopy or colonoscopy). This is done to check for the earliest forms of colorectal cancer.    Routine screening usually begins at age 50.  Direct examination of the colon should be repeated every 5-10 years through 63 years of age. However, you may need to be screened more often if early forms of precancerous polyps or small growths are found. Skin Cancer  Check your skin from head to toe regularly.  Tell your health care provider about any new moles or changes in moles, especially if there is a change in a mole's shape or color.  Also tell your health care provider if you have a mole that is larger than the size of a pencil eraser.  Always use sunscreen. Apply sunscreen liberally and repeatedly throughout the day.  Protect yourself by wearing long sleeves, pants, a wide-brimmed hat, and sunglasses whenever you are outside. Heart disease, diabetes, and high blood pressure  High blood pressure causes heart disease and increases the risk of stroke. High blood pressure is more likely to develop in: ? People who  have blood pressure in the high end of the normal range (130-139/85-89 mm Hg). ? People who are overweight or obese. ? People who are African American.  If you are 18-39 years of age, have your blood pressure checked every 3-5 years. If you are 40 years of age or older, have your blood pressure checked every year. You should have your blood pressure measured twice-once when you are at a hospital or clinic, and once when you are not at a hospital or clinic. Record the average of the two measurements. To check your blood pressure when you are not at a hospital or clinic, you can use: ? An automated blood pressure machine at a pharmacy. ? A home blood pressure monitor.  If you are between 55 years and 79 years old, ask your health care provider if you should take aspirin to prevent strokes.  Have regular diabetes screenings. This involves taking a blood sample to check your fasting blood sugar level. ? If you are at a normal weight and have a low risk for diabetes, have this test once every three years after 63 years of age. ? If you are overweight and have a high risk for diabetes, consider being tested at a younger age or more often. Preventing infection Hepatitis B  If you have a higher risk for hepatitis B, you should be screened for this virus. You are considered at high risk for hepatitis B if: ? You were born in a country where hepatitis B is common. Ask your health care provider which countries are considered high risk. ? Your parents were born in a high-risk country, and you have not been immunized against hepatitis B (hepatitis B vaccine). ? You have HIV or AIDS. ? You use needles to inject street drugs. ? You live with someone who has hepatitis B. ? You have had sex with someone who has hepatitis B. ? You get hemodialysis treatment. ? You take certain medicines for conditions, including cancer, organ transplantation, and autoimmune conditions. Hepatitis C  Blood testing is  recommended for: ? Everyone born from 1945 through 1965. ? Anyone with known risk factors for hepatitis C. Sexually transmitted infections (STIs)  You should be screened for sexually transmitted infections (STIs) including gonorrhea and chlamydia if: ? You are sexually active and are younger than 63 years of age. ? You are older than 63 years of age and your health care provider tells you that you are at risk for this type of infection. ? Your sexual activity has changed since you   were last screened and you are at an increased risk for chlamydia or gonorrhea. Ask your health care provider if you are at risk.  If you do not have HIV, but are at risk, it may be recommended that you take a prescription medicine daily to prevent HIV infection. This is called pre-exposure prophylaxis (PrEP). You are considered at risk if: ? You are sexually active and do not regularly use condoms or know the HIV status of your partner(s). ? You take drugs by injection. ? You are sexually active with a partner who has HIV. Talk with your health care provider about whether you are at high risk of being infected with HIV. If you choose to begin PrEP, you should first be tested for HIV. You should then be tested every 3 months for as long as you are taking PrEP. Pregnancy  If you are premenopausal and you may become pregnant, ask your health care provider about preconception counseling.  If you may become pregnant, take 400 to 800 micrograms (mcg) of folic acid every day.  If you want to prevent pregnancy, talk to your health care provider about birth control (contraception). Osteoporosis and menopause  Osteoporosis is a disease in which the bones lose minerals and strength with aging. This can result in serious bone fractures. Your risk for osteoporosis can be identified using a bone density scan.  If you are 65 years of age or older, or if you are at risk for osteoporosis and fractures, ask your health care  provider if you should be screened.  Ask your health care provider whether you should take a calcium or vitamin D supplement to lower your risk for osteoporosis.  Menopause may have certain physical symptoms and risks.  Hormone replacement therapy may reduce some of these symptoms and risks. Talk to your health care provider about whether hormone replacement therapy is right for you. Follow these instructions at home:  Schedule regular health, dental, and eye exams.  Stay current with your immunizations.  Do not use any tobacco products including cigarettes, chewing tobacco, or electronic cigarettes.  If you are pregnant, do not drink alcohol.  If you are breastfeeding, limit how much and how often you drink alcohol.  Limit alcohol intake to no more than 1 drink per day for nonpregnant women. One drink equals 12 ounces of beer, 5 ounces of wine, or 1 ounces of hard liquor.  Do not use street drugs.  Do not share needles.  Ask your health care provider for help if you need support or information about quitting drugs.  Tell your health care provider if you often feel depressed.  Tell your health care provider if you have ever been abused or do not feel safe at home. This information is not intended to replace advice given to you by your health care provider. Make sure you discuss any questions you have with your health care provider. Document Released: 02/23/2011 Document Revised: 01/16/2016 Document Reviewed: 05/14/2015 Elsevier Interactive Patient Education  2019 [employer] Inc.  

## 2018-09-22 LAB — CBC WITH DIFFERENTIAL/PLATELET
Basophils Absolute: 0.1 10*3/uL (ref 0.0–0.2)
Basos: 1 %
EOS (ABSOLUTE): 0.7 10*3/uL — AB (ref 0.0–0.4)
Eos: 8 %
HEMATOCRIT: 39.4 % (ref 34.0–46.6)
Hemoglobin: 13.4 g/dL (ref 11.1–15.9)
IMMATURE GRANS (ABS): 0 10*3/uL (ref 0.0–0.1)
IMMATURE GRANULOCYTES: 0 %
Lymphocytes Absolute: 2.4 10*3/uL (ref 0.7–3.1)
Lymphs: 28 %
MCH: 28.6 pg (ref 26.6–33.0)
MCHC: 34 g/dL (ref 31.5–35.7)
MCV: 84 fL (ref 79–97)
MONOS ABS: 0.6 10*3/uL (ref 0.1–0.9)
Monocytes: 7 %
Neutrophils Absolute: 4.9 10*3/uL (ref 1.4–7.0)
Neutrophils: 56 %
PLATELETS: 269 10*3/uL (ref 150–450)
RBC: 4.69 x10E6/uL (ref 3.77–5.28)
RDW: 13 % (ref 11.7–15.4)
WBC: 8.7 10*3/uL (ref 3.4–10.8)

## 2018-09-22 LAB — LIPID PANEL
CHOLESTEROL TOTAL: 161 mg/dL (ref 100–199)
Chol/HDL Ratio: 2.9 ratio (ref 0.0–4.4)
HDL: 55 mg/dL (ref >39)
LDL Calculated: 86 mg/dL (ref 0–99)
Triglycerides: 100 mg/dL (ref 0–149)
VLDL CHOLESTEROL CAL: 20 mg/dL (ref 5–40)

## 2018-09-22 LAB — COMPREHENSIVE METABOLIC PANEL
ALT: 26 IU/L (ref 0–32)
AST: 22 IU/L (ref 0–40)
Albumin/Globulin Ratio: 1.7 (ref 1.2–2.2)
Albumin: 4.6 g/dL (ref 3.8–4.8)
Alkaline Phosphatase: 100 IU/L (ref 39–117)
BUN/Creatinine Ratio: 18 (ref 12–28)
BUN: 11 mg/dL (ref 8–27)
Bilirubin Total: 0.4 mg/dL (ref 0.0–1.2)
CALCIUM: 9.8 mg/dL (ref 8.7–10.3)
CO2: 22 mmol/L (ref 20–29)
CREATININE: 0.62 mg/dL (ref 0.57–1.00)
Chloride: 99 mmol/L (ref 96–106)
GFR calc Af Amer: 112 mL/min/{1.73_m2} (ref >59)
GFR, EST NON AFRICAN AMERICAN: 97 mL/min/{1.73_m2} (ref >59)
GLOBULIN, TOTAL: 2.7 g/dL (ref 1.5–4.5)
Glucose: 80 mg/dL (ref 65–99)
Potassium: 4.7 mmol/L (ref 3.5–5.2)
Sodium: 138 mmol/L (ref 134–144)
TOTAL PROTEIN: 7.3 g/dL (ref 6.0–8.5)

## 2018-09-22 LAB — HEMOGLOBIN A1C
Est. average glucose Bld gHb Est-mCnc: 117 mg/dL
Hgb A1c MFr Bld: 5.7 % — ABNORMAL HIGH (ref 4.8–5.6)

## 2018-09-23 ENCOUNTER — Encounter: Payer: Self-pay | Admitting: Emergency Medicine

## 2018-09-23 ENCOUNTER — Encounter: Payer: Self-pay | Admitting: *Deleted

## 2018-10-06 ENCOUNTER — Other Ambulatory Visit: Payer: Self-pay | Admitting: Family Medicine

## 2018-10-06 NOTE — Telephone Encounter (Signed)
Requested Prescriptions  Pending Prescriptions Disp Refills  . ONETOUCH DELICA LANCETS 45T MISC [Pharmacy Med Name: ONE TOUCH DELICA 97F LANCETS] 414 each 11    Sig: USE UP TO 1 TIME AS DIRECTED     Endocrinology: Diabetes - Testing Supplies Passed - 10/06/2018  9:44 AM      Passed - Valid encounter within last 12 months    Recent Outpatient Visits          2 weeks ago Routine general medical examination at a health care facility   [facility] [place], [place], [place]   7 months ago Type 2 diabetes mellitus without complication, with long-term current use of insulin [facility])   [facility], Laurey Arrow, MD   1 year ago Annual physical exam   [facility], Laurey Arrow, MD   1 year ago Type 2 diabetes mellitus without complication, with long-term current use of insulin [facility])   [facility], Laurey Arrow, MD   1 year ago Medication monitoring encounter   [facility], Laurey Arrow, MD

## 2018-10-24 ENCOUNTER — Other Ambulatory Visit: Payer: Self-pay | Admitting: Family Medicine

## 2018-10-24 MED ORDER — GLUCOSE BLOOD VI STRP: ORAL_STRIP | 3 refills | Status: DC

## 2018-10-24 NOTE — Telephone Encounter (Signed)
Copied from [place]. Topic: Quick Communication - Rx Refill/Question >> Oct 24, 2018  3:32 PM Gustavus Messing wrote: Medication: glucose blood (ONE TOUCH ULTRA TEST) test strip   Has the patient contacted their pharmacy? No. (Agent: If no, request that the patient contact the pharmacy for the refill.) Patient was not sure what to do about her medications because she was seeing Dr. Brigitte Pulse but is trying to transfer to Dr. Mitchel Honour. She is out to the One Touch strips and neeeds more   Preferred Pharmacy (with phone number or street name): [id number] [address], [place]. [phone] (Phone) [phone] (Fax)    Agent: Please be advised that RX refills may take up to 3 business days. We ask that you follow-up with your pharmacy.

## 2018-10-28 ENCOUNTER — Other Ambulatory Visit: Payer: Self-pay | Admitting: Family Medicine

## 2018-10-28 MED ORDER — DULAGLUTIDE 0.75 MG/0.5ML ~~LOC~~ SOAJ: SUBCUTANEOUS | 1 refills | Status: DC

## 2018-10-28 NOTE — Telephone Encounter (Signed)
Copied from [place] [id number]. Topic: Quick Communication - Rx Refill/Question >> Oct 28, 2018  3:12 PM Rayann Heman wrote: Medication: Dulaglutide (TRULICITY) 7.34 KA/7.6OT SOPN [[id number]] needs 37mo supply   needs to go to [place] Has the patient contacted their pharmacy?yes Preferred Pharmacy (with phone number or street name): [id number] [address], [place]. [phone] (Phone) [phone] (Fax)

## 2019-01-31 NOTE — Telephone Encounter (Signed)
Provider no longer with practice

## 2019-04-12 ENCOUNTER — Telehealth: Payer: Self-pay | Admitting: Emergency Medicine

## 2019-04-12 NOTE — Telephone Encounter (Signed)
Medication Refill - Medication: metFORMIN (GLUCOPHAGE-XR) 500 MG 24 hr tablet   Has the patient contacted their pharmacy? Yes.   Pt called stating she onl has one pill left. Please advise.  (Agent: If no, request that the patient contact the pharmacy for the refill.) (Agent: If yes, when and what did the pharmacy advise?)  Preferred Pharmacy (with phone number or street name):  [id number] [address], [place] [address]  Phone: [phone] Fax: [phone]  Not a 24 hour pharmacy; exact hours not known.     Agent: Please be advised that RX refills may take up to 3 business days. We ask that you follow-up with your pharmacy.

## 2019-04-13 ENCOUNTER — Other Ambulatory Visit: Payer: Self-pay

## 2019-04-13 MED ORDER — METFORMIN HCL ER 500 MG PO TB24: 1000.0000 mg | ORAL_TABLET | Freq: Two times a day (BID) | ORAL | 0 refills | Status: DC

## 2019-04-25 ENCOUNTER — Other Ambulatory Visit: Payer: Self-pay | Admitting: Emergency Medicine

## 2019-04-25 NOTE — Telephone Encounter (Signed)
Requested medication (s) are due for refill today: yes  Requested medication (s) are on the active medication list: yes  Last refill: 02/06/19 Future visit scheduled: no  Notes to clinic:  Review for refill   Requested Prescriptions  Pending Prescriptions Disp Refills   TRULICITY [id number] SOPN [Pharmacy Med Name: TRULICITY [id number] ML PEN]  1    Sig: INJECT 0.75 MG INTO THE SKIN ONCE A WEEK     Endocrinology:  Diabetes - GLP-1 Receptor Agonists Failed - 04/25/2019  1:41 AM      Failed - HBA1C is between 0 and 7.9 and within 180 days    Hgb A1c MFr Bld  Date Value Ref Range Status  09/21/2018 5.7 (H) 4.8 - 5.6 % Final    Comment:             Prediabetes: 5.7 - 6.4          Diabetes: >6.4          Glycemic control for adults with diabetes: <7.0          Failed - Valid encounter within last 6 months    Recent Outpatient Visits          7 months ago Routine general medical examination at a health care facility   [facility] [place], [place], [place]   1 year ago Type 2 diabetes mellitus without complication, with long-term current use of insulin [facility])   [facility], Laurey Arrow, MD   1 year ago Annual physical exam   [facility], Laurey Arrow, MD   1 year ago Type 2 diabetes mellitus without complication, with long-term current use of insulin [facility])   [facility], Laurey Arrow, MD   2 years ago Medication monitoring encounter   [facility], Laurey Arrow, MD

## 2019-04-26 ENCOUNTER — Telehealth: Payer: Self-pay | Admitting: Emergency Medicine

## 2019-04-26 NOTE — Telephone Encounter (Signed)
Her metformin has been recalled would like to know what she can not take

## 2019-04-27 ENCOUNTER — Other Ambulatory Visit: Payer: Self-pay

## 2019-04-27 ENCOUNTER — Encounter: Payer: Self-pay | Admitting: Emergency Medicine

## 2019-04-27 ENCOUNTER — Telehealth (INDEPENDENT_AMBULATORY_CARE_PROVIDER_SITE_OTHER): Payer: Federal, State, Local not specified - PPO | Admitting: Emergency Medicine

## 2019-04-27 VITALS — BP 124/84 | Ht 62.0 in | Wt 207.4 lb

## 2019-04-27 DIAGNOSIS — I10 Essential (primary) hypertension: Secondary | ICD-10-CM | POA: Diagnosis not present

## 2019-04-27 DIAGNOSIS — G4733 Obstructive sleep apnea (adult) (pediatric): Secondary | ICD-10-CM | POA: Diagnosis not present

## 2019-04-27 DIAGNOSIS — Z1239 Encounter for other screening for malignant neoplasm of breast: Secondary | ICD-10-CM

## 2019-04-27 DIAGNOSIS — E1159 Type 2 diabetes mellitus with other circulatory complications: Secondary | ICD-10-CM

## 2019-04-27 DIAGNOSIS — Z794 Long term (current) use of insulin: Secondary | ICD-10-CM

## 2019-04-27 DIAGNOSIS — E039 Hypothyroidism, unspecified: Secondary | ICD-10-CM | POA: Diagnosis not present

## 2019-04-27 DIAGNOSIS — Z9989 Dependence on other enabling machines and devices: Secondary | ICD-10-CM

## 2019-04-27 DIAGNOSIS — E119 Type 2 diabetes mellitus without complications: Secondary | ICD-10-CM | POA: Diagnosis not present

## 2019-04-27 DIAGNOSIS — I152 Hypertension secondary to endocrine disorders: Secondary | ICD-10-CM

## 2019-04-27 DIAGNOSIS — Z20828 Contact with and (suspected) exposure to other viral communicable diseases: Secondary | ICD-10-CM | POA: Diagnosis not present

## 2019-04-27 MED ORDER — GLUCOSE BLOOD VI STRP: ORAL_STRIP | 3 refills | Status: DC

## 2019-04-27 MED ORDER — ONETOUCH DELICA LANCETS 33G MISC: 11 refills | Status: DC

## 2019-04-27 MED ORDER — LEVOTHYROXINE SODIUM 50 MCG PO TABS: ORAL_TABLET | ORAL | 3 refills | Status: DC

## 2019-04-27 MED ORDER — AMLODIPINE BESY-BENAZEPRIL HCL 10-20 MG PO CAPS: ORAL_CAPSULE | ORAL | 3 refills | Status: DC

## 2019-04-27 MED ORDER — METFORMIN HCL 1000 MG PO TABS: 1000.0000 mg | ORAL_TABLET | Freq: Two times a day (BID) | ORAL | 3 refills | Status: DC

## 2019-04-27 NOTE — Progress Notes (Signed)
Called patient to triage. Patient states this is a 6 month follow up, she needs medication refill and mammogram. Patient states she stop taking Metformin because it was recalled.

## 2019-04-27 NOTE — Progress Notes (Signed)
Telemedicine Encounter- SOAP NOTE Established Patient  This video telephone encounter via Doxy me was conducted with the patient's (or proxy's) verbal consent via video audio telecommunications: yes/no: Yes Patient was instructed to have this encounter in a suitably private space; and to only have persons present to whom they give permission to participate. In addition, patient identity was confirmed by use of name plus two identifiers (DOB and address).  I discussed the limitations, risks, security and privacy concerns of performing an evaluation and management service by telephone and the availability of in person appointments. I also discussed with the patient that there may be a patient responsible charge related to this service. The patient expressed understanding and agreed to proceed.  I spent a total of TIME; 0 MIN TO 60 MIN: 15 minutes talking with the patient or their proxy.  No chief complaint on file. Diabetes follow-up and medication refill  Subjective   Katherine Sanchez is a 63 y.o. female established patient. Telephone visit today for diabetes follow-up.  Sugars at home between 120 and 170.  Stopped taking metformin ER after getting a recall notice.  On Trulicity 6.01 weekly.  Also has a history of hypertension on medication.  Has no complaints or medical concerns today.  HPI   Patient Active Problem List   Diagnosis Date Noted  . Vitamin B12 deficiency 02/22/2018  . Type 2 diabetes mellitus without complication, with long-term current use of insulin ([place]) 06/22/2017  . Acquired hypothyroidism 06/22/2017  . OSA (obstructive sleep apnea) 06/28/2013  . Obesity (BMI 30-39.9) 07/08/2011  . Essential hypertension, benign 10/17/2007  . Chronic ischemic heart disease 10/17/2007  . Recurrent ventral incisional hernia, periumbilical 09/32/3557  . Osteoarthritis 10/14/2007  . ABNORMAL GLUCOSE NEC 10/14/2007    Past Medical History:  Diagnosis Date  . Abdominal distension    . Anxiety   . Arthritis    knees   . Constipation   . Diabetes mellitus without complication ([place])   . GERD (gastroesophageal reflux disease)   . Heart murmur   . Hyperlipidemia   . Hypertension   . Hypothyroidism   . Incisional hernia   . Pneumonia    hx of 2006   . Seasonal allergies   . Sleep apnea    cpap x 10 yrs Dr [name]   . Thyroid disease     Current Outpatient Medications  Medication Sig Dispense Refill  . amLODipine-benazepril (LOTREL) 10-20 MG capsule TAKE 1 CAPSULE BY MOUTH EVERY DAY 90 capsule 2  . Ascorbic Acid (VITAMIN C) POWD Take 1,000 mg by mouth daily.     Marland Kitchen aspirin 81 MG tablet Take 81 mg by mouth daily.    . blood glucose meter kit and supplies KIT Dispense based on patient and insurance preference. Use up to one time as directed  (FOR ICD-9 250.00, 250.01). 1 each 0  . docusate sodium (COLACE) 100 MG capsule Take 100 mg by mouth 2 (two) times daily as needed.      . fluticasone (FLONASE) 50 MCG/ACT nasal spray USE 2 SPRAYS IN BOTH NOSTRILS EVERY DAY 16 g 5  . glucose blood (ONE TOUCH ULTRA TEST) test strip USE UP TO 1 TIME AS DIRECTED 25 each 3  . ibuprofen (ADVIL,MOTRIN) 200 MG tablet Take 200 mg by mouth every 6 (six) hours as needed.      Marland Kitchen levothyroxine (SYNTHROID, LEVOTHROID) 50 MCG tablet TAKE 1 TABLET BY MOUTH EVERY DAY BEFORE BREAKFAST 90 tablet 1  . meloxicam (MOBIC) 15  MG tablet Take 1 tablet (15 mg total) by mouth daily as needed for pain. 90 tablet 0  . TRULICITY 7.61 PJ/0.9TO SOPN INJECT 0.75 MG INTO THE SKIN ONCE A WEEK 6 pen 1  . metFORMIN (GLUCOPHAGE-XR) 500 MG 24 hr tablet Take 2 tablets (1,000 mg total) by mouth 2 (two) times daily with a meal. (Patient not taking: Reported on 04/27/2019) 120 tablet 0  . ONETOUCH DELICA LANCETS 67T MISC USE UP TO 1 TIME AS DIRECTED 100 each 11   No current facility-administered medications for this visit.     Allergies  Allergen Reactions  . Amoxicillin     hives hives hives    Social History    Socioeconomic History  . Marital status: Married    Spouse name: Not on file  . Number of children: Not on file  . Years of education: Not on file  . Highest education level: Not on file  Occupational History  . 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 Financial resource strain: Not on file  . Food insecurity    Worry: Not on file    Inability: Not on file  . Transportation needs    Medical: Not on file    Non-medical: Not on file  Tobacco Use  . Smoking status: Former Smoker    Packs/day: 0.10    Years: 4.00    Pack years: 0.40    Types: Cigarettes    Quit date: 07/07/1978    Years since quitting: 40.8  . Smokeless tobacco: Never Used  . Tobacco comment: occassional smoker with alcohol consumption  Substance and Sexual Activity  . Alcohol use: No    Comment: quit 1979  . Drug use: No  . Sexual activity: Yes    Birth control/protection: None    Comment: number of sex partners in the last 12 months 1  Lifestyle  . Physical activity    Days per week: Not on file    Minutes per session: Not on file  . Stress: Not on file  Relationships  . Social Herbalist on phone: Not on file    Gets together: Not on file    Attends religious service: Not on file    Active member of club or organization: Not on file    Attends meetings of clubs or organizations: Not on file    Relationship status: Not on file  . Intimate partner violence    Fear of current or ex partner: Not on file    Emotionally abused: Not on file    Physically abused: Not on file    Forced sexual activity: Not on file  Other Topics Concern  . Not on file  Social History Narrative   Lives with husband and his mother is currently living with them after sustaining a fall in Oct. 2013.   Exercise walking 2 times daily    Review of Systems  Constitutional: Negative.  Negative for chills and fever.  HENT: Negative.  Negative for congestion and sore throat.   Respiratory: Negative.   Negative for cough and shortness of breath.   Cardiovascular: Negative.  Negative for chest pain and palpitations.  Gastrointestinal: Negative.  Negative for abdominal pain, blood in stool, diarrhea, nausea and vomiting.  Genitourinary: Negative.   Musculoskeletal: Negative for myalgias.  Skin: Negative.  Negative for rash.  Neurological: Negative for dizziness and headaches.  All other systems reviewed and are negative.   Objective  Alert and oriented x3 in  no apparent respiratory distress. Vitals as reported by the patient: Today's Vitals   04/27/19 1642  BP: 124/84  Weight: 207 lb 6.4 oz (94.1 kg)  Height: _0  (1.575 m)    Diagnoses and all orders for this visit:  Type 2 diabetes mellitus without complication, with long-term current use of insulin ([facility]) -     OneTouch Delica Lancets 19E MISC; USE UP TO 1 TIME AS DIRECTED -     glucose blood (ONE TOUCH ULTRA TEST) test strip; USE UP TO 1 TIME AS DIRECTED -     metFORMIN (GLUCOPHAGE) 1000 MG tablet; Take 1 tablet (1,000 mg total) by mouth 2 (two) times daily with a meal.  Essential hypertension  OSA on CPAP  Essential hypertension, benign -     amLODipine-benazepril (LOTREL) 10-20 MG capsule; TAKE 1 CAPSULE BY MOUTH EVERY DAY  Acquired hypothyroidism -     levothyroxine (SYNTHROID) 50 MCG tablet; Take one by mouth daily  Breast cancer screening -     MM Digital Screening; Future  Hypertension associated with type 2 diabetes mellitus (HCC)   Clinically stable.  No medical concerns identified during this visit.  Continue present medications.  No changes. Office visit in 3 months.   I discussed the assessment and treatment plan with the patient. The patient was provided an opportunity to ask questions and all were answered. The patient agreed with the plan and demonstrated an understanding of the instructions.   The patient was advised to call back or seek an in-person evaluation if the symptoms worsen or if the  condition fails to improve as anticipated.  I provided 15 minutes of non-face-to-face time during this encounter.  Horald Pollen, MD  Primary Care at [facility]

## 2019-05-02 ENCOUNTER — Encounter: Payer: Self-pay | Admitting: Neurology

## 2019-05-03 ENCOUNTER — Encounter: Payer: Self-pay | Admitting: Neurology

## 2019-05-03 ENCOUNTER — Telehealth: Payer: Self-pay

## 2019-05-03 ENCOUNTER — Other Ambulatory Visit: Payer: Self-pay

## 2019-05-03 ENCOUNTER — Ambulatory Visit: Payer: Federal, State, Local not specified - PPO | Admitting: Neurology

## 2019-05-03 VITALS — BP 140/84 | HR 63 | Ht 62.0 in | Wt 207.0 lb

## 2019-05-03 DIAGNOSIS — G4733 Obstructive sleep apnea (adult) (pediatric): Secondary | ICD-10-CM | POA: Diagnosis not present

## 2019-05-03 DIAGNOSIS — Z9989 Dependence on other enabling machines and devices: Secondary | ICD-10-CM

## 2019-05-03 NOTE — Progress Notes (Signed)
Order for cpap supplies sent to [facility] via community message. Confirmation received that the order transmitted was successful.  

## 2019-05-03 NOTE — Patient Instructions (Addendum)
Please continue using your autoPAP regularly. While your insurance requires that you use PAP at least 4 hours each night on 70% of the nights, I recommend, that you not skip any nights and use it throughout the night if you can. Getting used to PAP and staying with the treatment long term does take time and patience and discipline. Untreated obstructive sleep apnea when it is moderate to severe can have an adverse impact on cardiovascular health and raise her risk for heart disease, arrhythmias, hypertension, congestive heart failure, stroke and diabetes. Untreated obstructive sleep apnea causes sleep disruption, nonrestorative sleep, and sleep deprivation. This can have an impact on your day to day functioning and cause daytime sleepiness and impairment of cognitive function, memory loss, mood disturbance, and problems focussing. Using PAP regularly can improve these symptoms.  Keep up the good work! We can see you in 1 year. In order to get a new machine, you will need an use of study since your previous study is several years old. We will consider this At your next visit since this current machine is working well for you.

## 2019-05-03 NOTE — Telephone Encounter (Signed)
I called pt and reminded her to bring her cpap to her appt this afternoon. Pt verbalized understanding.

## 2019-05-03 NOTE — Progress Notes (Signed)
Subjective:    Patient ID: Katherine Sanchez is a 63 y.o. female.  HPI    Interim history:   Katherine Sanchez is a 63 year old right-handed woman with an underlying medical history of arthritis, chronic constipation, reflux disease, hyperlipidemia, MVP, hypertension, hypothyroidism, history of pneumonia in 2006, hypothyroidism, and obesity, who presents for follow-up consultation of her obstructive sleep apnea, on AutoPap therapy. The patient is unaccompanied today and presents for her yearly checkup. I last saw her on 04/27/2018, at which time she was compliant with her treatment.  She felt AutoPap was working well.  She was advised to talk to her DME company about her machine, as far as her eligibility for a new machine.  Other than that, she was advised to follow-up routinely in 1 year.    Today, 05/03/2019: I reviewed her AutoPap compliance data from 04/03/2019 through 05/02/2019 which is a total of 30 days, during which time she used her machine every night with percent use days greater than 4 hours at 100%, indicating superb compliance with an average usage of 7 hours and 38 minutes, residual AHI at goal at 3.7/h, 95th percentile of her AutoPap pressure was 13.6 cm with a range of 5 cm to 15 cm with EPR.  Leak was on the high side fairly consistently with the exception of several days in a row in late August, 95th percentile of leak was 25.6 L/min. She reports that her AutoPap is working well for her.  She has gained a little bit of weight since the pandemic.  Her sons were staying with them for some months, she has a 63 year old and a 30 year old son.  She and her husband are also helping with her father and father-in-law, both in their 32s.  She is generally speaking up-to-date with her supplies for her AutoPap machine.  The patient's allergies, current medications, family history, past medical history, past social history, past surgical history and problem list were reviewed and updated as appropriate.     Previously (copied from previous notes for reference):    I saw her on 03/24/2017, at which time she was fully compliant with AutoPap therapy. She was trying to lose weight. She was advised to follow-up in one year routinely.   I reviewed her AutoPap compliance data from 03/28/2018 through 04/26/2014 which is a total of 30 days, during which time she used her AutoPap every night with percent used days greater than 4 hours at 100%, indicating superb compliance with an average usage of 7 hours and 16 minutes, residual AHI 4.3 per hour, 95th percentile of pressure at 13.8 cm, leak on the higher end with the 95th percentile at 23.3 L/m on a pressure range of 5 cm to 15 cm with EPR.    I first met her on 08/05/2016 at the request of her primary care physician, at which time she reported a prior diagnosis of OSA and being on AutoPap therapy. She was compliant with treatment. She was encouraged to follow-up routinely in 6 months.   I reviewed her AutoPap compliance data from 02/22/2017 through 03/23/2014, which is a total of 30 days, during which time she used her machine every night with percent used days greater than 4 hours at 100%, indicating superb compliance with an average usage of 7 hours and 23 minutes, residual AHI at goal at 3.7 per hour, leak acceptable for the 95th percentile at 12.7 L/m and 95th percentile pressure at 14 cm, pressure range of 5-15 with EPR.    08/05/2016: (  She) was previously diagnosed with obstructive sleep apnea and placed on PAP therapy. Sleep study test results are not available for my review, original diagnosis is probably more than 15 years ago. She has been on CPAP. I reviewed her compliance data from 07/06/2016 through 08/04/2016, which is a total of 30 days, during which time she used her machine every night with percent used days greater than 4 hours of 100%, indicating superb compliance with an average usage of 6 hours and 54 minutes, residual AHI 4.2 per hour, leak  acceptable at 11 L/m on a pressure range of 5-15 auto, 95th percentile pressure at 14 cm. Her Epworth sleepiness score is 10 out of 24 today, her fatigue score is 25 out of 63.   I reviewed your office note from 07/02/2016. She had recently had an episode of acute headache, had a CT head at the time and I reviewed the results from her head CT without contrast on 06/12/2016: IMPRESSION: No evidence of acute intracranial abnormality.   Mild chronic small-vessel white matter ischemic changes.    She was treated symptomatically for her headache with Toradol and Fioricet and improved without recurrence of a similar headache. She denies any restless leg symptoms or leg twitching at night or parasomnias. She is generally in bed between 11:30 and midnight and wake up time is around 6:45-7, she usually has to take the dog out. She lives at home with her husband. 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 She has 2 grown sons area she quit smoking and quit drinking alcohol in 1986. She was never heavy drinker. She drinks one cup of coffee each morning. She has been trying to lose weight. She denies any morning headaches. She has been using a fullface mask with good tolerance. She had some recent stressors in her family and felt more exhausted but things are settling down some now. She was told that she had moderate obstructive sleep apnea when she was originally diagnosed at age 17. This is her second CPAP machine. She has no concerns about using her machine. She indicates full compliance and great results with using CPAP. In fact, she feels that she cannot sleep without it. When she first got diagnosed in her 93s, she had significant daytime somnolence and this improved tremendously when she was started on CPAP therapy.  Her Past Medical History Is Significant For: Past Medical History:  Diagnosis Date  . Abdominal distension   . Anxiety   . Arthritis    knees    . Constipation   . Diabetes mellitus without complication ([place])   . GERD (gastroesophageal reflux disease)   . Heart murmur   . Hyperlipidemia   . Hypertension   . Hypothyroidism   . Incisional hernia   . Pneumonia    hx of 2006   . Seasonal allergies   . Sleep apnea    cpap x 10 yrs Dr [name]   . Thyroid disease     Her Past Surgical History Is Significant For: Past Surgical History:  Procedure Laterality Date  . APPENDECTOMY  1982  . CARDIAC CATHETERIZATION     1999 and negative  . CHOLECYSTECTOMY  2010   lap with open redo umbilical hernia repair.  Dr. [name]  . HERNIA REPAIR  0100   umbilical open w mesh plug. Dr. [name] and 2010 Dr [name]   . KNEE SURGERY  2006   left - arthroscopic  . VENTRAL  HERNIA REPAIR  08/13/2011   Procedure: LAPAROSCOPIC VENTRAL HERNIA;  Surgeon: Adin Hector, MD;  Location: [facility];  Service: General;  Laterality: N/A;  Laparoscopic Ventral Hernia Repair with Mesh    Her Family History Is Significant For: Family History  Problem Relation Age of Onset  . Cancer Mother        colon  . Heart disease Mother 31       congestive heart failure  . COPD Mother   . Colon cancer Mother 48  . Other Father 93       car accident  . Cancer Maternal Grandmother        ovarian  . Asthma Son   . Allergies Son   . Hypertension Paternal Grandmother   . Cancer Paternal Grandfather        prostate  . Breast cancer Neg Hx     Her Social History Is Significant For: Social History   Socioeconomic History  . Marital status: Married    Spouse name: Not on file  . Number of children: Not on file  . Years of education: Not on file  . Highest education level: Not on file  Occupational History  . 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 Financial resource strain: Not on file  . Food insecurity    Worry: Not on file    Inability: Not on file  . Transportation needs    Medical: Not on file    Non-medical: Not on file   Tobacco Use  . Smoking status: Former Smoker    Packs/day: 0.10    Years: 4.00    Pack years: 0.40    Types: Cigarettes    Quit date: 07/07/1978    Years since quitting: 40.8  . Smokeless tobacco: Never Used  . Tobacco comment: occassional smoker with alcohol consumption  Substance and Sexual Activity  . Alcohol use: No    Comment: quit 1979  . Drug use: No  . Sexual activity: Yes    Birth control/protection: None    Comment: number of sex partners in the last 12 months 1  Lifestyle  . Physical activity    Days per week: Not on file    Minutes per session: Not on file  . Stress: Not on file  Relationships  . Social Herbalist on phone: Not on file    Gets together: Not on file    Attends religious service: Not on file    Active member of club or organization: Not on file    Attends meetings of clubs or organizations: Not on file    Relationship status: Not on file  Other Topics Concern  . Not on file  Social History Narrative   Lives with husband and his mother is currently living with them after sustaining a fall in Oct. 2013.   Exercise walking 2 times daily    Her Allergies Are:  Allergies  Allergen Reactions  . Amoxicillin     hives hives hives  :   Her Current Medications Are:  Outpatient Encounter Medications as of 05/03/2019  Medication Sig  . amLODipine-benazepril (LOTREL) 10-20 MG capsule TAKE 1 CAPSULE BY MOUTH EVERY DAY  . Ascorbic Acid (VITAMIN C) POWD Take 1,000 mg by mouth daily.   Marland Kitchen aspirin 81 MG tablet Take 81 mg by mouth daily.  . blood glucose meter kit and supplies KIT Dispense based on patient and insurance preference. Use up to one time as directed  (  FOR ICD-9 250.00, 250.01).  Marland Kitchen docusate sodium (COLACE) 100 MG capsule Take 100 mg by mouth 2 (two) times daily as needed.    . fluticasone (FLONASE) 50 MCG/ACT nasal spray USE 2 SPRAYS IN BOTH NOSTRILS EVERY DAY  . glucose blood (ONE TOUCH ULTRA TEST) test strip USE UP TO 1 TIME AS  DIRECTED  . ibuprofen (ADVIL,MOTRIN) 200 MG tablet Take 200 mg by mouth every 6 (six) hours as needed.    Marland Kitchen levothyroxine (SYNTHROID) 50 MCG tablet Take one by mouth daily  . meloxicam (MOBIC) 15 MG tablet Take 1 tablet (15 mg total) by mouth daily as needed for pain.  . metFORMIN (GLUCOPHAGE) 1000 MG tablet Take 1 tablet (1,000 mg total) by mouth 2 (two) times daily with a meal.  . OneTouch Delica Lancets 65H MISC USE UP TO 1 TIME AS DIRECTED  . TRULICITY 8.46 NG/2.9BM SOPN INJECT 0.75 MG INTO THE SKIN ONCE A WEEK   No facility-administered encounter medications on file as of 05/03/2019.   :  Review of Systems:  Out of a complete 14 point review of systems, all are reviewed and negative with the exception of these symptoms as listed below:  Review of Systems  Neurological:       Pt presents today to discuss her cpap. Pt reports that her cpap is going well.    Objective:  Neurological Exam  Physical Exam Physical Examination:   Vitals:   05/03/19 1412  BP: 140/84  Pulse: 63    General Examination: The patient is a very pleasant 63 y.o. female in no acute distress. She appears well-developed and well-nourished and well groomed.   HEENT:Normocephalic, atraumatic, pupils are equal, round and reactive to light and accommodation.She has corrective eyeglasses.Extraocular tracking is good without limitation to gaze excursion or nystagmus noted. Normal smooth pursuit is noted. Hearing is grossly intact. Face is symmetric with normal facial animation. Speech is clear with no dysarthria noted. There is no hypophonia. There is no lip, neck/head, jaw or voice tremor. Neck shows FROM. Oropharynx exam reveals: No significant changes, no Significant mouth dryness, tongue protrudes centrally in palate elevates symmetrically.  Chest:Clear to auscultation without wheezing, rhonchi or crackles noted.  Heart:S1+S2+0, regular and ?able mild systolic murmur, no rubs or gallops noted.    Abdomen:Soft, non-tender and non-distended.  Extremities:There is nopitting edema in the distal lower extremities bilaterally.   Skin: Warm and dry without trophic changes noted. There are no varicose veins.  Musculoskeletal: exam reveals no obvious joint deformities, tenderness or joint swelling or erythema.   Neurologically:  Mental status: The patient is awake, alert and oriented in all 4 spheres. Herimmediate and remote memory, attention, language skills and fund of knowledge are appropriate. There is no evidence of aphasia, agnosia, apraxia or anomia. Speech is clear with normal prosody and enunciation. Thought process is linear. Mood is normaland affect is normal.  Cranial nerves II - XII are as described above under HEENT exam.  Motor exam: Normal bulk, strength and tone is noted. There is no tremor. Fine motor skills and coordination:Grossly intact. Sensory exam: intact to light touch in the upper and lower extremities.  Gait, station and balance: Shestands easily. No veering to one side is noted. No leaning to one side is noted. Posture is age-appropriate and stance is narrow based. Gait shows normalstride length and normalpace. No problems turning are noted.   Assessment and Plan:  In summary, FILOMENA POKORNEY a very pleasant 63 year old female ith an underlying medical  history of arthritis, chronic constipation, reflux disease, hyperlipidemia, MVP, hypertension, hypothyroidism, history of pneumonia in 2006, hypothyroidism, and obesity, who Has been on AutoPap therapy for her sleep apnea.  She is fully compliant with treatment and commended for it.  Since this current machine is working reasonably well for her, we decided to forego any sleep testing currently especially due to the COVID-19 pandemic.  She is eligible most likely for a new machine but we can revisit next time.  She is motivated to continue with treatment and also encouraged to work on weight loss. She  is advised to follow-up for sleep apnea recheck in one year. I spent 20 minutes in total face-to-face time with the patient, more than 50% of which was spent in counseling and coordination of care, reviewing test results, reviewing medication and discussing or reviewing the diagnosis of OSA, its prognosis and treatment options. Pertinent laboratory and imaging test results that were available during this visit with the patient were reviewed by me and considered in my medical decision making (see chart for details).

## 2019-05-06 ENCOUNTER — Other Ambulatory Visit: Payer: Self-pay | Admitting: Emergency Medicine

## 2019-05-06 NOTE — Telephone Encounter (Signed)
Requested medication (s) are due for refill today: yes  Requested medication (s) are on the active medication list: yes  Last refill:  04/27/2019  Future visit scheduled: yes  Notes to clinic:  The original prescription was discontinued on 04/27/2019 by Horald Pollen, MD for the following reason: P&T Policy: Therapeutic Substitute. Renewing this prescription may not be appropriate   Requested Prescriptions  Pending Prescriptions Disp Refills   metFORMIN (GLUCOPHAGE-XR) 500 MG 24 hr tablet [Pharmacy Med Name: METFORMIN HCL ER 500 MG TABLET] 120 tablet 0    Sig: TAKE 2 TABLETS (1,000 MG TOTAL) BY MOUTH 2 (TWO) TIMES DAILY WITH A MEAL. (NEED OFFICE VISIT)     Endocrinology:  Diabetes - Biguanides Failed - 05/06/2019  9:30 AM      Failed - HBA1C is between 0 and 7.9 and within 180 days    Hgb A1c MFr Bld  Date Value Ref Range Status  09/21/2018 5.7 (H) 4.8 - 5.6 % Final    Comment:             Prediabetes: 5.7 - 6.4          Diabetes: >6.4          Glycemic control for adults with diabetes: <7.0          Passed - Cr in normal range and within 360 days    Creat  Date Value Ref Range Status  06/12/2016 0.69 0.50 - 1.05 mg/dL Final    Comment:      For patients > or = 63 years of age: The upper reference limit for Creatinine is approximately 13% higher for people identified as African-American.      Creatinine, Ser  Date Value Ref Range Status  09/21/2018 0.62 0.57 - 1.00 mg/dL Final         Passed - eGFR in normal range and within 360 days    GFR, Est African American  Date Value Ref Range Status  03/24/2015 >89 >=60 mL/min Final   GFR calc Af Amer  Date Value Ref Range Status  09/21/2018 112 >59 mL/min/1.73 Final   GFR, Est Non African American  Date Value Ref Range Status  03/24/2015 >89 >=60 mL/min Final    Comment:      The estimated GFR is a calculation valid for adults (>=76 years old) that uses the CKD-EPI algorithm to adjust for age and sex. It is    not to be used for children, pregnant women, hospitalized patients,    patients on dialysis, or with rapidly changing kidney function. According to the NKDEP, eGFR >89 is normal, 60-89 shows mild impairment, 30-59 shows moderate impairment, 15-29 shows severe impairment and <15 is ESRD.      GFR calc non Af Amer  Date Value Ref Range Status  09/21/2018 97 >59 mL/min/1.73 Final         Passed - Valid encounter within last 6 months    Recent Outpatient Visits          1 week ago Type 2 diabetes mellitus without complication, with long-term current use of insulin [facility])   [facility] at [facility], Ines Bloomer, MD   7 months ago Routine general medical examination at a health care facility   [facility] at [place], [place], [place]   1 year ago Type 2 diabetes mellitus without complication, with long-term current use of insulin [facility])   [facility] at [facility], Laurey Arrow, MD   1 year ago Annual  physical exam   [facility] at [facility], Laurey Arrow, MD   1 year ago Type 2 diabetes mellitus without complication, with long-term current use of insulin [facility])   [facility] at [facility], Laurey Arrow, MD      Future Appointments            In 2 months [place], Ines Bloomer, MD [facility] at [place], [facility]

## 2019-07-29 DIAGNOSIS — Z20828 Contact with and (suspected) exposure to other viral communicable diseases: Secondary | ICD-10-CM | POA: Diagnosis not present

## 2019-07-31 ENCOUNTER — Ambulatory Visit: Payer: Federal, State, Local not specified - PPO | Admitting: Emergency Medicine

## 2019-09-05 DIAGNOSIS — Z20828 Contact with and (suspected) exposure to other viral communicable diseases: Secondary | ICD-10-CM | POA: Diagnosis not present

## 2019-09-12 ENCOUNTER — Ambulatory Visit: Payer: Federal, State, Local not specified - PPO | Admitting: Emergency Medicine

## 2019-09-26 ENCOUNTER — Other Ambulatory Visit: Payer: Self-pay

## 2019-09-26 ENCOUNTER — Encounter: Payer: Self-pay | Admitting: Emergency Medicine

## 2019-09-26 ENCOUNTER — Encounter: Payer: Self-pay | Admitting: Gastroenterology

## 2019-09-26 ENCOUNTER — Telehealth: Payer: Self-pay | Admitting: Emergency Medicine

## 2019-09-26 ENCOUNTER — Ambulatory Visit (INDEPENDENT_AMBULATORY_CARE_PROVIDER_SITE_OTHER): Payer: Federal, State, Local not specified - PPO | Admitting: Emergency Medicine

## 2019-09-26 VITALS — BP 127/82 | HR 69 | Temp 98.8°F | Resp 16 | Ht 62.0 in | Wt 208.0 lb

## 2019-09-26 DIAGNOSIS — Z0001 Encounter for general adult medical examination with abnormal findings: Secondary | ICD-10-CM

## 2019-09-26 DIAGNOSIS — Z794 Long term (current) use of insulin: Secondary | ICD-10-CM

## 2019-09-26 DIAGNOSIS — Z1231 Encounter for screening mammogram for malignant neoplasm of breast: Secondary | ICD-10-CM | POA: Diagnosis not present

## 2019-09-26 DIAGNOSIS — Z0000 Encounter for general adult medical examination without abnormal findings: Secondary | ICD-10-CM | POA: Diagnosis not present

## 2019-09-26 DIAGNOSIS — G4733 Obstructive sleep apnea (adult) (pediatric): Secondary | ICD-10-CM

## 2019-09-26 DIAGNOSIS — E1159 Type 2 diabetes mellitus with other circulatory complications: Secondary | ICD-10-CM | POA: Diagnosis not present

## 2019-09-26 DIAGNOSIS — Z130 Encounter for screening for diseases of the blood and blood-forming organs and certain disorders involving the immune mechanism: Secondary | ICD-10-CM

## 2019-09-26 DIAGNOSIS — Z23 Encounter for immunization: Secondary | ICD-10-CM

## 2019-09-26 DIAGNOSIS — Z889 Allergy status to unspecified drugs, medicaments and biological substances status: Secondary | ICD-10-CM

## 2019-09-26 DIAGNOSIS — E119 Type 2 diabetes mellitus without complications: Secondary | ICD-10-CM

## 2019-09-26 DIAGNOSIS — I152 Hypertension secondary to endocrine disorders: Secondary | ICD-10-CM

## 2019-09-26 DIAGNOSIS — Z1211 Encounter for screening for malignant neoplasm of colon: Secondary | ICD-10-CM

## 2019-09-26 DIAGNOSIS — Z1152 Encounter for screening for COVID-19: Secondary | ICD-10-CM

## 2019-09-26 DIAGNOSIS — Z9989 Dependence on other enabling machines and devices: Secondary | ICD-10-CM

## 2019-09-26 DIAGNOSIS — Z13220 Encounter for screening for lipoid disorders: Secondary | ICD-10-CM

## 2019-09-26 DIAGNOSIS — E039 Hypothyroidism, unspecified: Secondary | ICD-10-CM

## 2019-09-26 DIAGNOSIS — I10 Essential (primary) hypertension: Secondary | ICD-10-CM

## 2019-09-26 LAB — POCT GLYCOSYLATED HEMOGLOBIN (HGB A1C): Hemoglobin A1C: 5.7 % — AB (ref 4.0–5.6)

## 2019-09-26 LAB — GLUCOSE, POCT (MANUAL RESULT ENTRY): POC Glucose: 91 mg/dl (ref 70–99)

## 2019-09-26 MED ORDER — FLUTICASONE PROPIONATE 50 MCG/ACT NA SUSP: NASAL | 5 refills | Status: DC

## 2019-09-26 NOTE — Progress Notes (Signed)
Katherine Sanchez 64 y.o.   No chief complaint on file.   HISTORY OF PRESENT ILLNESS: This is a 64 y.o. female here for annual exam.  Has the following medical problems: 1.  Diabetes: On Metformin 1000 mg twice a day and Trulicity 9.41 mg weekly.  Blood glucose at home between 80 and 120.  No complaints. 2.  Hypertension: On Lotrel.  Normal blood pressure readings at home.  No complaints. 3.  Hypothyroidism: On Synthroid 50 mcg daily. 72 year old son had Covid early last year and shortly after patient had flulike symptoms for about a week.  Was not tested for Covid. HPI   Prior to Admission medications   Medication Sig Start Date End Date Taking? Authorizing Provider  amLODipine-benazepril (LOTREL) 10-20 MG capsule TAKE 1 CAPSULE BY MOUTH EVERY DAY 04/27/19   Horald Pollen, MD  Ascorbic Acid (VITAMIN C) POWD Take 1,000 mg by mouth daily.     [provider]  aspirin 81 MG tablet Take 81 mg by mouth daily.    [provider]  blood glucose meter kit and supplies KIT Dispense based on patient and insurance preference. Use up to one time as directed  (FOR ICD-9 250.00, 250.01). 03/24/15   Le, [name], DO  docusate sodium (COLACE) 100 MG capsule Take 100 mg by mouth 2 (two) times daily as needed.      [provider]  fluticasone Asencion Islam) 50 MCG/ACT nasal spray Use 2 sprays in both nostrils every day 09/26/19   Horald Pollen, MD  glucose blood (ONE TOUCH ULTRA TEST) test strip USE UP TO 1 TIME AS DIRECTED 04/27/19   Horald Pollen, MD  ibuprofen (ADVIL,MOTRIN) 200 MG tablet Take 200 mg by mouth every 6 (six) hours as needed.      [provider]  levothyroxine (SYNTHROID) 50 MCG tablet Take one by mouth daily 04/27/19   Horald Pollen, MD  meloxicam (MOBIC) 15 MG tablet Take 1 tablet (15 mg total) by mouth daily as needed for pain. 08/05/16   Shawnee Knapp, MD  metFORMIN (GLUCOPHAGE) 1000 MG tablet Take 1 tablet (1,000 mg total) by mouth  2 (two) times daily with a meal. 04/27/19   Sebastian Lurz, Ines Bloomer, MD  OneTouch Delica Lancets 74Y MISC USE UP TO 1 TIME AS DIRECTED 04/27/19   Horald Pollen, MD  TRULICITY 8.14 GY/1.8HU SOPN INJECT 0.75 MG INTO THE SKIN ONCE A WEEK 04/26/19   Horald Pollen, MD    Allergies  Allergen Reactions  . Amoxicillin     hives hives hives    Patient Active Problem List   Diagnosis Date Noted  . Vitamin B12 deficiency 02/22/2018  . Type 2 diabetes mellitus without complication, with long-term current use of insulin ([place]) 06/22/2017  . Acquired hypothyroidism 06/22/2017  . OSA (obstructive sleep apnea) 06/28/2013  . Obesity (BMI 30-39.9) 07/08/2011  . Essential hypertension, benign 10/17/2007  . Chronic ischemic heart disease 10/17/2007  . Recurrent ventral incisional hernia, periumbilical 31/49/7026  . Osteoarthritis 10/14/2007  . ABNORMAL GLUCOSE NEC 10/14/2007    Past Medical History:  Diagnosis Date  . Abdominal distension   . Anxiety   . Arthritis    knees   . Constipation   . Diabetes mellitus without complication ([place])   . GERD (gastroesophageal reflux disease)   . Heart murmur   . Hyperlipidemia   . Hypertension   . Hypothyroidism   . Incisional hernia   . Pneumonia    hx  of 2006   . Seasonal allergies   . Sleep apnea    cpap x 10 yrs Dr [name]   . Thyroid disease     Past Surgical History:  Procedure Laterality Date  . APPENDECTOMY  1982  . CARDIAC CATHETERIZATION     1999 and negative  . CHOLECYSTECTOMY  2010   lap with open redo umbilical hernia repair.  Dr. [name]  . HERNIA REPAIR  3267   umbilical open w mesh plug. Dr. [name] and 2010 Dr [name]   . KNEE SURGERY  2006   left - arthroscopic  . VENTRAL HERNIA REPAIR  08/13/2011   Procedure: LAPAROSCOPIC VENTRAL HERNIA;  Surgeon: Adin Hector, MD;  Location: [facility];  Service: General;  Laterality: N/A;  Laparoscopic Ventral Hernia Repair with Mesh    Social History   Socioeconomic  History  . Marital status: Married    Spouse name: Not on file  . Number of children: Not on file  . Years of education: Not on file  . Highest education level: Not on file  Occupational History  . 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 Smoking status: Former Smoker    Packs/day: 0.10    Years: 4.00    Pack years: 0.40    Types: Cigarettes    Quit date: 07/07/1978    Years since quitting: 41.2  . Smokeless tobacco: Never Used  . Tobacco comment: occassional smoker with alcohol consumption  Substance and Sexual Activity  . Alcohol use: No    Comment: quit 1979  . Drug use: No  . Sexual activity: Yes    Birth control/protection: None    Comment: number of sex partners in the last 57 months 1  Other Topics Concern  . Not on file  Social History Narrative   Lives with husband and his mother is currently living with them after sustaining a fall in Oct. 2013.   Exercise walking 2 times daily   Social Determinants of Health   Financial Resource Strain:   . Difficulty of Paying Living Expenses: Not on file  Food Insecurity:   . Worried About Charity fundraiser in the Last Year: Not on file  . Ran Out of Food in the Last Year: Not on file  Transportation Needs:   . Lack of Transportation (Medical): Not on file  . Lack of Transportation (Non-Medical): Not on file  Physical Activity:   . Days of Exercise per Week: Not on file  . Minutes of Exercise per Session: Not on file  Stress:   . Feeling of Stress : Not on file  Social Connections:   . Frequency of Communication with Friends and Family: Not on file  . Frequency of Social Gatherings with Friends and Family: Not on file  . Attends Religious Services: Not on file  . Active Member of Clubs or Organizations: Not on file  . Attends Archivist Meetings: Not on file  . Marital Status: Not on file  Intimate Partner Violence:   . Fear of Current or Ex-Partner: Not on file  . Emotionally Abused: Not  on file  . Physically Abused: Not on file  . Sexually Abused: Not on file    Family History  Problem Relation Age of Onset  . Cancer Mother        colon  . Heart disease Mother 31       congestive heart failure  . COPD Mother   . Colon cancer Mother 12  .  Other Father 37       car accident  . Cancer Maternal Grandmother        ovarian  . Asthma Son   . Allergies Son   . Hypertension Paternal Grandmother   . Cancer Paternal Grandfather        prostate  . Breast cancer Neg Hx      Review of Systems  Constitutional: Negative.  Negative for chills and fever.  HENT: Negative.  Negative for congestion and sore throat.   Eyes: Negative.   Respiratory: Negative.  Negative for cough and shortness of breath.   Cardiovascular: Negative.  Negative for chest pain and palpitations.  Gastrointestinal: Negative.  Negative for abdominal pain, diarrhea, nausea and vomiting.  Genitourinary: Negative.  Negative for hematuria.  Musculoskeletal: Negative.  Negative for back pain, myalgias and neck pain.  Skin: Negative.  Negative for rash.  Neurological: Negative.  Negative for dizziness, weakness and headaches.  Endo/Heme/Allergies: Negative.   All other systems reviewed and are negative.   Today's Vitals   09/26/19 1028  BP: 127/82  Pulse: 69  Resp: 16  Temp: 98.8 F (37.1 C)  TempSrc: Temporal  SpO2: 98%  Weight: 208 lb (94.3 kg)  Height: _0  (1.575 m)   Body mass index is 38.04 kg/m.  Physical Exam Vitals reviewed.  Constitutional:      Appearance: Normal appearance.  HENT:     Head: Normocephalic.  Eyes:     Extraocular Movements: Extraocular movements intact.     Conjunctiva/sclera: Conjunctivae normal.     Pupils: Pupils are equal, round, and reactive to light.  Neck:     Vascular: No carotid bruit.  Cardiovascular:     Rate and Rhythm: Normal rate and regular rhythm.     Heart sounds: Murmur (Systolic 3/6 aortic area) present.  Pulmonary:     Effort:  Pulmonary effort is normal.     Breath sounds: Normal breath sounds.  Abdominal:     General: Bowel sounds are normal.  Musculoskeletal:        General: No tenderness.     Cervical back: Normal range of motion and neck supple.     Right lower leg: No edema.     Left lower leg: No edema.  Lymphadenopathy:     Cervical: No cervical adenopathy.  Skin:    General: Skin is warm and dry.     Capillary Refill: Capillary refill takes less than 2 seconds.  Neurological:     General: No focal deficit present.     Mental Status: She is alert and oriented to person, place, and time.  Psychiatric:        Mood and Affect: Mood normal.        Behavior: Behavior normal.    Results for orders placed or performed in visit on 09/26/19 (from the past 24 hour(s))  POCT glucose (manual entry)     Status: None   Collection Time: 09/26/19 10:09 AM  Result Value Ref Range   POC Glucose 91 70 - 99 mg/dl  POCT glycosylated hemoglobin (Hb A1C)     Status: Abnormal   Collection Time: 09/26/19 10:16 AM  Result Value Ref Range   Hemoglobin A1C 5.7 (A) 4.0 - 5.6 %   HbA1c POC (<> result, manual entry)     HbA1c, POC (prediabetic range)     HbA1c, POC (controlled diabetic range)       ASSESSMENT & PLAN: Hypertension associated with type 2 diabetes mellitus ([place]) Well-controlled hypertension.  Continue present medications.  No changes. Well-controlled diabetes with hemoglobin A1c of 5.7.  Continue present medication no changes.  Continue present diet and nutrition. Follow-up in 6 months.  Diagnoses and all orders for this visit:  Annual physical exam -     Comprehensive metabolic panel -     Lipid panel -     CBC with Differential/Platelet  Type 2 diabetes mellitus without complication, with long-term current use of insulin ([facility]) -     POCT glucose (manual entry) -     POCT glycosylated hemoglobin (Hb A1C)  Screening for colon cancer -     Ambulatory referral to Gastroenterology  Encounter for  screening mammogram for malignant neoplasm of breast -     MM Digital Screening; Future  History of seasonal allergies -     fluticasone (FLONASE) 50 MCG/ACT nasal spray; Use 2 sprays in both nostrils every day  Encounter for screening for COVID-19 -     SAR CoV2 Serology (COVID 19)AB(IGG)IA  Essential hypertension  OSA on CPAP  Acquired hypothyroidism  Hypertension associated with type 2 diabetes mellitus ([place])  Encounter for general adult medical examination with abnormal findings  Screening for deficiency anemia  Screening for lipoid disorders  Other orders -     Flu Vaccine QUAD 36+ mos IM    Patient Instructions    Health Maintenance, Female Adopting a healthy lifestyle and getting preventive care are important in promoting health and wellness. Ask your health care provider about:  The right schedule for you to have regular tests and exams.  Things you can do on your own to prevent diseases and keep yourself healthy. What should I know about diet, weight, and exercise? Eat a healthy diet   Eat a diet that includes plenty of vegetables, fruits, low-fat dairy products, and lean protein.  Do not eat a lot of foods that are high in solid fats, added sugars, or sodium. Maintain a healthy weight Body mass index (BMI) is used to identify weight problems. It estimates body fat based on height and weight. Your health care provider can help determine your BMI and help you achieve or maintain a healthy weight. Get regular exercise Get regular exercise. This is one of the most important things you can do for your health. Most adults should:  Exercise for at least 150 minutes each week. The exercise should increase your heart rate and make you sweat (moderate-intensity exercise).  Do strengthening exercises at least twice a week. This is in addition to the moderate-intensity exercise.  Spend less time sitting. Even light physical activity can be beneficial. Watch  cholesterol and blood lipids Have your blood tested for lipids and cholesterol at 64 years of age, then have this test every 5 years. Have your cholesterol levels checked more often if:  Your lipid or cholesterol levels are high.  You are older than 64 years of age.  You are at high risk for heart disease. What should I know about cancer screening? Depending on your health history and family history, you may need to have cancer screening at various ages. This may include screening for:  Breast cancer.  Cervical cancer.  Colorectal cancer.  Skin cancer.  Lung cancer. What should I know about heart disease, diabetes, and high blood pressure? Blood pressure and heart disease  High blood pressure causes heart disease and increases the risk of stroke. This is more likely to develop in people who have high blood pressure readings, are of African descent, or  are overweight.  Have your blood pressure checked: ? Every 3-5 years if you are 28-69 years of age. ? Every year if you are 2 years old or older. Diabetes Have regular diabetes screenings. This checks your fasting blood sugar level. Have the screening done:  Once every three years after age 66 if you are at a normal weight and have a low risk for diabetes.  More often and at a younger age if you are overweight or have a high risk for diabetes. What should I know about preventing infection? Hepatitis B If you have a higher risk for hepatitis B, you should be screened for this virus. Talk with your health care provider to find out if you are at risk for hepatitis B infection. Hepatitis C Testing is recommended for:  Everyone born from [birth year] through 1965.  Anyone with known risk factors for hepatitis C. Sexually transmitted infections (STIs)  Get screened for STIs, including gonorrhea and chlamydia, if: ? You are sexually active and are younger than 64 years of age. ? You are older than 64 years of age and your health care  provider tells you that you are at risk for this type of infection. ? Your sexual activity has changed since you were last screened, and you are at increased risk for chlamydia or gonorrhea. Ask your health care provider if you are at risk.  Ask your health care provider about whether you are at high risk for HIV. Your health care provider may recommend a prescription medicine to help prevent HIV infection. If you choose to take medicine to prevent HIV, you should first get tested for HIV. You should then be tested every 3 months for as long as you are taking the medicine. Pregnancy  If you are about to stop having your period (premenopausal) and you may become pregnant, seek counseling before you get pregnant.  Take 400 to 800 micrograms (mcg) of folic acid every day if you become pregnant.  Ask for birth control (contraception) if you want to prevent pregnancy. Osteoporosis and menopause Osteoporosis is a disease in which the bones lose minerals and strength with aging. This can result in bone fractures. If you are 78 years old or older, or if you are at risk for osteoporosis and fractures, ask your health care provider if you should:  Be screened for bone loss.  Take a calcium or vitamin D supplement to lower your risk of fractures.  Be given hormone replacement therapy (HRT) to treat symptoms of menopause. Follow these instructions at home: Lifestyle  Do not use any products that contain nicotine or tobacco, such as cigarettes, e-cigarettes, and chewing tobacco. If you need help quitting, ask your health care provider.  Do not use street drugs.  Do not share needles.  Ask your health care provider for help if you need support or information about quitting drugs. Alcohol use  Do not drink alcohol if: ? Your health care provider tells you not to drink. ? You are pregnant, may be pregnant, or are planning to become pregnant.  If you drink alcohol: ? Limit how much you use to 0-1  drink a day. ? Limit intake if you are breastfeeding.  Be aware of how much alcohol is in your drink. In the [place] one drink equals one 12 oz bottle of beer (355 mL), one 5 oz glass of wine (148 mL), or one 1 oz glass of hard liquor (44 mL). General instructions  Schedule regular health, dental, and eye  exams.  Stay current with your vaccines.  Tell your health care provider if: ? You often feel depressed. ? You have ever been abused or do not feel safe at home. Summary  Adopting a healthy lifestyle and getting preventive care are important in promoting health and wellness.  Follow your health care provider's instructions about healthy diet, exercising, and getting tested or screened for diseases.  Follow your health care provider's instructions on monitoring your cholesterol and blood pressure. This information is not intended to replace advice given to you by your health care provider. Make sure you discuss any questions you have with your health care provider. Document Revised: 08/03/2018 Document Reviewed: 08/03/2018 Elsevier Patient Education  [place].  Diabetes Mellitus and Nutrition, Adult When you have diabetes (diabetes mellitus), it is very important to have healthy eating habits because your blood sugar (glucose) levels are greatly affected by what you eat and drink. Eating healthy foods in the appropriate amounts, at about the same times every day, can help you:  Control your blood glucose.  Lower your risk of heart disease.  Improve your blood pressure.  Reach or maintain a healthy weight. Every person with diabetes is different, and each person has different needs for a meal plan. Your health care provider may recommend that you work with a diet and nutrition specialist (dietitian) to make a meal plan that is best for you. Your meal plan may vary depending on factors such as:  The calories you need.  The medicines you take.  Your weight.  Your blood  glucose, blood pressure, and cholesterol levels.  Your activity level.  Other health conditions you have, such as heart or kidney disease. How do carbohydrates affect me? Carbohydrates, also called carbs, affect your blood glucose level more than any other type of food. Eating carbs naturally raises the amount of glucose in your blood. Carb counting is a method for keeping track of how many carbs you eat. Counting carbs is important to keep your blood glucose at a healthy level, especially if you use insulin or take certain oral diabetes medicines. It is important to know how many carbs you can safely have in each meal. This is different for every person. Your dietitian can help you calculate how many carbs you should have at each meal and for each snack. Foods that contain carbs include:  Bread, cereal, rice, pasta, and crackers.  Potatoes and corn.  Peas, beans, and lentils.  Milk and yogurt.  Fruit and juice.  Desserts, such as cakes, cookies, ice cream, and candy. How does alcohol affect me? Alcohol can cause a sudden decrease in blood glucose (hypoglycemia), especially if you use insulin or take certain oral diabetes medicines. Hypoglycemia can be a life-threatening condition. Symptoms of hypoglycemia (sleepiness, dizziness, and confusion) are similar to symptoms of having too much alcohol. If your health care provider says that alcohol is safe for you, follow these guidelines:  Limit alcohol intake to no more than 1 drink per day for nonpregnant women and 2 drinks per day for men. One drink equals 12 oz of beer, 5 oz of wine, or 1 oz of hard liquor.  Do not drink on an empty stomach.  Keep yourself hydrated with water, diet soda, or unsweetened iced tea.  Keep in mind that regular soda, juice, and other mixers may contain a lot of sugar and must be counted as carbs. What are tips for following this plan?  Reading food labels  Start by checking  the serving size on the  "Nutrition Facts" label of packaged foods and drinks. The amount of calories, carbs, fats, and other nutrients listed on the label is based on one serving of the item. Many items contain more than one serving per package.  Check the total grams (g) of carbs in one serving. You can calculate the number of servings of carbs in one serving by dividing the total carbs by 15. For example, if a food has 30 g of total carbs, it would be equal to 2 servings of carbs.  Check the number of grams (g) of saturated and trans fats in one serving. Choose foods that have low or no amount of these fats.  Check the number of milligrams (mg) of salt (sodium) in one serving. Most people should limit total sodium intake to less than 2,300 mg per day.  Always check the nutrition information of foods labeled as "low-fat" or "nonfat". These foods may be higher in added sugar or refined carbs and should be avoided.  Talk to your dietitian to identify your daily goals for nutrients listed on the label. Shopping  Avoid buying canned, premade, or processed foods. These foods tend to be high in fat, sodium, and added sugar.  Shop around the outside edge of the grocery store. This includes fresh fruits and vegetables, bulk grains, fresh meats, and fresh dairy. Cooking  Use low-heat cooking methods, such as baking, instead of high-heat cooking methods like deep frying.  Cook using healthy oils, such as olive, canola, or sunflower oil.  Avoid cooking with butter, cream, or high-fat meats. Meal planning  Eat meals and snacks regularly, preferably at the same times every day. Avoid going long periods of time without eating.  Eat foods high in fiber, such as fresh fruits, vegetables, beans, and whole grains. Talk to your dietitian about how many servings of carbs you can eat at each meal.  Eat 4-6 ounces (oz) of lean protein each day, such as lean meat, chicken, fish, eggs, or tofu. One oz of lean protein is equal  to: ? 1 oz of meat, chicken, or fish. ? 1 egg. ?  cup of tofu.  Eat some foods each day that contain healthy fats, such as avocado, nuts, seeds, and fish. Lifestyle  Check your blood glucose regularly.  Exercise regularly as told by your health care provider. This may include: ? 150 minutes of moderate-intensity or vigorous-intensity exercise each week. This could be brisk walking, biking, or water aerobics. ? Stretching and doing strength exercises, such as yoga or weightlifting, at least 2 times a week.  Take medicines as told by your health care provider.  Do not use any products that contain nicotine or tobacco, such as cigarettes and e-cigarettes. If you need help quitting, ask your health care provider.  Work with a Social worker or diabetes educator to identify strategies to manage stress and any emotional and social challenges. Questions to ask a health care provider  Do I need to meet with a diabetes educator?  Do I need to meet with a dietitian?  What number can I call if I have questions?  When are the best times to check my blood glucose? Where to find more information:  American Diabetes Association: diabetes.org  Academy of Nutrition and Dietetics: www.eatright.[employer] of Diabetes and Digestive and Kidney Diseases (NIH): [employer] Summary  A healthy meal plan will help you control your blood glucose and maintain a healthy lifestyle.  Working with a diet and  nutrition specialist (dietitian) can help you make a meal plan that is best for you.  Keep in mind that carbohydrates (carbs) and alcohol have immediate effects on your blood glucose levels. It is important to count carbs and to use alcohol carefully. This information is not intended to replace advice given to you by your health care provider. Make sure you discuss any questions you have with your health care provider. Document Revised: 07/23/2017 Document Reviewed: 09/14/2016 Elsevier  Patient Education  2020 Elsevier Inc.      Agustina Caroli, MD [facility]

## 2019-09-26 NOTE — Assessment & Plan Note (Signed)
Well-controlled hypertension.  Continue present medications.  No changes. Well-controlled diabetes with hemoglobin A1c of 5.7.  Continue present medication no changes.  Continue present diet and nutrition. Follow-up in 6 months.

## 2019-09-26 NOTE — Patient Instructions (Addendum)
Health Maintenance, Female Adopting a healthy lifestyle and getting preventive care are important in promoting health and wellness. Ask your health care provider about:  The right schedule for you to have regular tests and exams.  Things you can do on your own to prevent diseases and keep yourself healthy. What should I know about diet, weight, and exercise? Eat a healthy diet   Eat a diet that includes plenty of vegetables, fruits, low-fat dairy products, and lean protein.  Do not eat a lot of foods that are high in solid fats, added sugars, or sodium. Maintain a healthy weight Body mass index (BMI) is used to identify weight problems. It estimates body fat based on height and weight. Your health care provider can help determine your BMI and help you achieve or maintain a healthy weight. Get regular exercise Get regular exercise. This is one of the most important things you can do for your health. Most adults should:  Exercise for at least 150 minutes each week. The exercise should increase your heart rate and make you sweat (moderate-intensity exercise).  Do strengthening exercises at least twice a week. This is in addition to the moderate-intensity exercise.  Spend less time sitting. Even light physical activity can be beneficial. Watch cholesterol and blood lipids Have your blood tested for lipids and cholesterol at 64 years of age, then have this test every 5 years. Have your cholesterol levels checked more often if:  Your lipid or cholesterol levels are high.  You are older than 64 years of age.  You are at high risk for heart disease. What should I know about cancer screening? Depending on your health history and family history, you may need to have cancer screening at various ages. This may include screening for:  Breast cancer.  Cervical cancer.  Colorectal cancer.  Skin cancer.  Lung cancer. What should I know about heart disease, diabetes, and high blood  pressure? Blood pressure and heart disease  High blood pressure causes heart disease and increases the risk of stroke. This is more likely to develop in people who have high blood pressure readings, are of African descent, or are overweight.  Have your blood pressure checked: ? Every 3-5 years if you are 54-9 years of age. ? Every year if you are 69 years old or older. Diabetes Have regular diabetes screenings. This checks your fasting blood sugar level. Have the screening done:  Once every three years after age 36 if you are at a normal weight and have a low risk for diabetes.  More often and at a younger age if you are overweight or have a high risk for diabetes. What should I know about preventing infection? Hepatitis B If you have a higher risk for hepatitis B, you should be screened for this virus. Talk with your health care provider to find out if you are at risk for hepatitis B infection. Hepatitis C Testing is recommended for:  Everyone born from [birth year] through 1965.  Anyone with known risk factors for hepatitis C. Sexually transmitted infections (STIs)  Get screened for STIs, including gonorrhea and chlamydia, if: ? You are sexually active and are younger than 64 years of age. ? You are older than 64 years of age and your health care provider tells you that you are at risk for this type of infection. ? Your sexual activity has changed since you were last screened, and you are at increased risk for chlamydia or gonorrhea. Ask your health care provider  if you are at risk.  Ask your health care provider about whether you are at high risk for HIV. Your health care provider may recommend a prescription medicine to help prevent HIV infection. If you choose to take medicine to prevent HIV, you should first get tested for HIV. You should then be tested every 3 months for as long as you are taking the medicine. Pregnancy  If you are about to stop having your period (premenopausal) and  you may become pregnant, seek counseling before you get pregnant.  Take 400 to 800 micrograms (mcg) of folic acid every day if you become pregnant.  Ask for birth control (contraception) if you want to prevent pregnancy. Osteoporosis and menopause Osteoporosis is a disease in which the bones lose minerals and strength with aging. This can result in bone fractures. If you are 34 years old or older, or if you are at risk for osteoporosis and fractures, ask your health care provider if you should:  Be screened for bone loss.  Take a calcium or vitamin D supplement to lower your risk of fractures.  Be given hormone replacement therapy (HRT) to treat symptoms of menopause. Follow these instructions at home: Lifestyle  Do not use any products that contain nicotine or tobacco, such as cigarettes, e-cigarettes, and chewing tobacco. If you need help quitting, ask your health care provider.  Do not use street drugs.  Do not share needles.  Ask your health care provider for help if you need support or information about quitting drugs. Alcohol use  Do not drink alcohol if: ? Your health care provider tells you not to drink. ? You are pregnant, may be pregnant, or are planning to become pregnant.  If you drink alcohol: ? Limit how much you use to 0-1 drink a day. ? Limit intake if you are breastfeeding.  Be aware of how much alcohol is in your drink. In the [place] one drink equals one 12 oz bottle of beer (355 mL), one 5 oz glass of wine (148 mL), or one 1 oz glass of hard liquor (44 mL). General instructions  Schedule regular health, dental, and eye exams.  Stay current with your vaccines.  Tell your health care provider if: ? You often feel depressed. ? You have ever been abused or do not feel safe at home. Summary  Adopting a healthy lifestyle and getting preventive care are important in promoting health and wellness.  Follow your health care provider's instructions about healthy  diet, exercising, and getting tested or screened for diseases.  Follow your health care provider's instructions on monitoring your cholesterol and blood pressure. This information is not intended to replace advice given to you by your health care provider. Make sure you discuss any questions you have with your health care provider. Document Revised: 08/03/2018 Document Reviewed: 08/03/2018 Elsevier Patient Education  [place].  Diabetes Mellitus and Nutrition, Adult When you have diabetes (diabetes mellitus), it is very important to have healthy eating habits because your blood sugar (glucose) levels are greatly affected by what you eat and drink. Eating healthy foods in the appropriate amounts, at about the same times every day, can help you:  Control your blood glucose.  Lower your risk of heart disease.  Improve your blood pressure.  Reach or maintain a healthy weight. Every person with diabetes is different, and each person has different needs for a meal plan. Your health care provider may recommend that you work with a diet and nutrition specialist (  dietitian) to make a meal plan that is best for you. Your meal plan may vary depending on factors such as:  The calories you need.  The medicines you take.  Your weight.  Your blood glucose, blood pressure, and cholesterol levels.  Your activity level.  Other health conditions you have, such as heart or kidney disease. How do carbohydrates affect me? Carbohydrates, also called carbs, affect your blood glucose level more than any other type of food. Eating carbs naturally raises the amount of glucose in your blood. Carb counting is a method for keeping track of how many carbs you eat. Counting carbs is important to keep your blood glucose at a healthy level, especially if you use insulin or take certain oral diabetes medicines. It is important to know how many carbs you can safely have in each meal. This is different for  every person. Your dietitian can help you calculate how many carbs you should have at each meal and for each snack. Foods that contain carbs include:  Bread, cereal, rice, pasta, and crackers.  Potatoes and corn.  Peas, beans, and lentils.  Milk and yogurt.  Fruit and juice.  Desserts, such as cakes, cookies, ice cream, and candy. How does alcohol affect me? Alcohol can cause a sudden decrease in blood glucose (hypoglycemia), especially if you use insulin or take certain oral diabetes medicines. Hypoglycemia can be a life-threatening condition. Symptoms of hypoglycemia (sleepiness, dizziness, and confusion) are similar to symptoms of having too much alcohol. If your health care provider says that alcohol is safe for you, follow these guidelines:  Limit alcohol intake to no more than 1 drink per day for nonpregnant women and 2 drinks per day for men. One drink equals 12 oz of beer, 5 oz of wine, or 1 oz of hard liquor.  Do not drink on an empty stomach.  Keep yourself hydrated with water, diet soda, or unsweetened iced tea.  Keep in mind that regular soda, juice, and other mixers may contain a lot of sugar and must be counted as carbs. What are tips for following this plan?  Reading food labels  Start by checking the serving size on the "Nutrition Facts" label of packaged foods and drinks. The amount of calories, carbs, fats, and other nutrients listed on the label is based on one serving of the item. Many items contain more than one serving per package.  Check the total grams (g) of carbs in one serving. You can calculate the number of servings of carbs in one serving by dividing the total carbs by 15. For example, if a food has 30 g of total carbs, it would be equal to 2 servings of carbs.  Check the number of grams (g) of saturated and trans fats in one serving. Choose foods that have low or no amount of these fats.  Check the number of milligrams (mg) of salt (sodium) in one  serving. Most people should limit total sodium intake to less than 2,300 mg per day.  Always check the nutrition information of foods labeled as "low-fat" or "nonfat". These foods may be higher in added sugar or refined carbs and should be avoided.  Talk to your dietitian to identify your daily goals for nutrients listed on the label. Shopping  Avoid buying canned, premade, or processed foods. These foods tend to be high in fat, sodium, and added sugar.  Shop around the outside edge of the grocery store. This includes fresh fruits and vegetables, bulk grains, fresh  meats, and fresh dairy. Cooking  Use low-heat cooking methods, such as baking, instead of high-heat cooking methods like deep frying.  Cook using healthy oils, such as olive, canola, or sunflower oil.  Avoid cooking with butter, cream, or high-fat meats. Meal planning  Eat meals and snacks regularly, preferably at the same times every day. Avoid going long periods of time without eating.  Eat foods high in fiber, such as fresh fruits, vegetables, beans, and whole grains. Talk to your dietitian about how many servings of carbs you can eat at each meal.  Eat 4-6 ounces (oz) of lean protein each day, such as lean meat, chicken, fish, eggs, or tofu. One oz of lean protein is equal to: ? 1 oz of meat, chicken, or fish. ? 1 egg. ?  cup of tofu.  Eat some foods each day that contain healthy fats, such as avocado, nuts, seeds, and fish. Lifestyle  Check your blood glucose regularly.  Exercise regularly as told by your health care provider. This may include: ? 150 minutes of moderate-intensity or vigorous-intensity exercise each week. This could be brisk walking, biking, or water aerobics. ? Stretching and doing strength exercises, such as yoga or weightlifting, at least 2 times a week.  Take medicines as told by your health care provider.  Do not use any products that contain nicotine or tobacco, such as cigarettes and  e-cigarettes. If you need help quitting, ask your health care provider.  Work with a Social worker or diabetes educator to identify strategies to manage stress and any emotional and social challenges. Questions to ask a health care provider  Do I need to meet with a diabetes educator?  Do I need to meet with a dietitian?  What number can I call if I have questions?  When are the best times to check my blood glucose? Where to find more information:  American Diabetes Association: diabetes.org  Academy of Nutrition and Dietetics: www.eatright.[employer] of Diabetes and Digestive and Kidney Diseases (NIH): [employer] Summary  A healthy meal plan will help you control your blood glucose and maintain a healthy lifestyle.  Working with a diet and nutrition specialist (dietitian) can help you make a meal plan that is best for you.  Keep in mind that carbohydrates (carbs) and alcohol have immediate effects on your blood glucose levels. It is important to count carbs and to use alcohol carefully. This information is not intended to replace advice given to you by your health care provider. Make sure you discuss any questions you have with your health care provider. Document Revised: 07/23/2017 Document Reviewed: 09/14/2016 Elsevier Patient Education  2020 [employer].

## 2019-09-26 NOTE — Telephone Encounter (Signed)
09/26/2019 - PATIENT SAW DR. Kittie Plater ON Tuesday (09/26/2019). DR. Kittie Plater HAS REQUESTED SHE RETURN TO FOLLOW-UP WITH HIM IN 6 MONTHS (AUG. 2021). OUR COMPUTERS WERE DOWN WHEN SHE WAS HERE. I TRIED TO CALL AND SCHEDULE WHEN THEY CAME BACK UP BUT HAD TO LEAVE HER A VOICE MAIL TO RETURN MY CALL. [place]

## 2019-09-27 ENCOUNTER — Encounter: Payer: Self-pay | Admitting: Emergency Medicine

## 2019-09-27 LAB — COMPREHENSIVE METABOLIC PANEL
ALT: 33 IU/L — ABNORMAL HIGH (ref 0–32)
AST: 29 IU/L (ref 0–40)
Albumin/Globulin Ratio: 1.5 (ref 1.2–2.2)
Albumin: 4.6 g/dL (ref 3.8–4.8)
Alkaline Phosphatase: 111 IU/L (ref 39–117)
BUN/Creatinine Ratio: 19 (ref 12–28)
BUN: 11 mg/dL (ref 8–27)
Bilirubin Total: 0.3 mg/dL (ref 0.0–1.2)
CO2: 19 mmol/L — ABNORMAL LOW (ref 20–29)
Calcium: 9.8 mg/dL (ref 8.7–10.3)
Chloride: 101 mmol/L (ref 96–106)
Creatinine, Ser: 0.58 mg/dL (ref 0.57–1.00)
GFR calc Af Amer: 113 mL/min/{1.73_m2} (ref >59)
GFR calc non Af Amer: 98 mL/min/{1.73_m2} (ref >59)
Globulin, Total: 3 g/dL (ref 1.5–4.5)
Glucose: 96 mg/dL (ref 65–99)
Potassium: 4.9 mmol/L (ref 3.5–5.2)
Sodium: 138 mmol/L (ref 134–144)
Total Protein: 7.6 g/dL (ref 6.0–8.5)

## 2019-09-27 LAB — CBC WITH DIFFERENTIAL/PLATELET
Basophils Absolute: 0.1 10*3/uL (ref 0.0–0.2)
Basos: 1 %
EOS (ABSOLUTE): 0.2 10*3/uL (ref 0.0–0.4)
Eos: 3 %
Hematocrit: 45.9 % (ref 34.0–46.6)
Hemoglobin: 14.6 g/dL (ref 11.1–15.9)
Immature Grans (Abs): 0 10*3/uL (ref 0.0–0.1)
Immature Granulocytes: 0 %
Lymphocytes Absolute: 2.2 10*3/uL (ref 0.7–3.1)
Lymphs: 32 %
MCH: 27.2 pg (ref 26.6–33.0)
MCHC: 31.8 g/dL (ref 31.5–35.7)
MCV: 86 fL (ref 79–97)
Monocytes Absolute: 0.4 10*3/uL (ref 0.1–0.9)
Monocytes: 6 %
Neutrophils Absolute: 3.8 10*3/uL (ref 1.4–7.0)
Neutrophils: 58 %
Platelets: 335 10*3/uL (ref 150–450)
RBC: 5.37 x10E6/uL — ABNORMAL HIGH (ref 3.77–5.28)
RDW: 13.5 % (ref 11.7–15.4)
WBC: 6.7 10*3/uL (ref 3.4–10.8)

## 2019-09-27 LAB — SAR COV2 SEROLOGY (COVID19)AB(IGG),IA: DiaSorin SARS-CoV-2 Ab, IgG: NEGATIVE

## 2019-09-27 LAB — LIPID PANEL
Chol/HDL Ratio: 3.3 ratio (ref 0.0–4.4)
Cholesterol, Total: 161 mg/dL (ref 100–199)
HDL: 49 mg/dL (ref >39)
LDL Chol Calc (NIH): 90 mg/dL (ref 0–99)
Triglycerides: 124 mg/dL (ref 0–149)
VLDL Cholesterol Cal: 22 mg/dL (ref 5–40)

## 2019-10-07 ENCOUNTER — Other Ambulatory Visit: Payer: Self-pay | Admitting: Emergency Medicine

## 2019-10-07 NOTE — Telephone Encounter (Signed)
Requested Prescriptions  Pending Prescriptions Disp Refills  . TRULICITY [id number] SOPN [Pharmacy Med Name: TRULICITY [id number] ML PEN] 6 pen 1    Sig: INJECT 0.75 MG INTO THE SKIN ONCE A WEEK     Endocrinology:  Diabetes - GLP-1 Receptor Agonists Passed - 10/07/2019  8:39 AM      Passed - HBA1C is between 0 and 7.9 and within 180 days    Hemoglobin A1C  Date Value Ref Range Status  09/26/2019 5.7 (A) 4.0 - 5.6 % Final   Hgb A1c MFr Bld  Date Value Ref Range Status  09/21/2018 5.7 (H) 4.8 - 5.6 % Final    Comment:             Prediabetes: 5.7 - 6.4          Diabetes: >6.4          Glycemic control for adults with diabetes: <7.0          Passed - Valid encounter within last 6 months    Recent Outpatient Visits          1 week ago Annual physical exam   [facility] [place], [place], MD   5 months ago Type 2 diabetes mellitus without complication, with long-term current use of insulin [facility])   [facility] at [facility], Ines Bloomer, MD   1 year ago Routine general medical examination at a health care facility   [facility] [place], [place], MD   1 year ago Type 2 diabetes mellitus without complication, with long-term current use of insulin [facility])   [facility], Laurey Arrow, MD   2 years ago Annual physical exam   [facility], Laurey Arrow, MD      Future Appointments            In 5 months [place], Ines Bloomer, MD [facility] [place], [facility]

## 2019-10-24 ENCOUNTER — Other Ambulatory Visit: Payer: Self-pay

## 2019-10-24 ENCOUNTER — Ambulatory Visit (AMBULATORY_SURGERY_CENTER): Payer: Self-pay

## 2019-10-24 VITALS — Temp 96.6°F | Ht 62.0 in | Wt 213.8 lb

## 2019-10-24 DIAGNOSIS — Z01818 Encounter for other preprocedural examination: Secondary | ICD-10-CM

## 2019-10-24 DIAGNOSIS — Z86010 Personal history of colonic polyps: Secondary | ICD-10-CM

## 2019-10-24 MED ORDER — NA SULFATE-K SULFATE-MG SULF 17.5-3.13-1.6 GM/177ML PO SOLN: 1.0000 | Freq: Once | ORAL | 0 refills | Status: AC

## 2019-10-24 NOTE — Progress Notes (Signed)
No egg or soy allergy known to patient  No issues with past sedation with any surgeries  or procedures, no intubation problems  No diet pills per patient No home 02 use per patient  No blood thinners per patient  Pt denies issues with constipation  No A fib or A flutter    Due to the COVID-19 pandemic we are asking patients to follow these guidelines. Please only bring one care partner. Please be aware that your care partner may wait in the car in the parking lot or if they feel like they will be too hot to wait in the car, they may wait in the lobby on the 4th floor. All care partners are required to wear a mask the entire time (we do not have any that we can provide them), they need to practice social distancing, and we will do a Covid check for all patient's and care partners when you arrive. Also we will check their temperature and your temperature. If the care partner waits in their car they need to stay in the parking lot the entire time and we will call them on their cell phone when the patient is ready for discharge so they can bring the car to the front of the building. Also all patient's will need to wear a mask into building.

## 2019-11-02 ENCOUNTER — Ambulatory Visit (INDEPENDENT_AMBULATORY_CARE_PROVIDER_SITE_OTHER): Payer: Federal, State, Local not specified - PPO

## 2019-11-02 ENCOUNTER — Other Ambulatory Visit: Payer: Self-pay | Admitting: Gastroenterology

## 2019-11-02 DIAGNOSIS — Z1159 Encounter for screening for other viral diseases: Secondary | ICD-10-CM

## 2019-11-03 LAB — SARS CORONAVIRUS 2 (TAT 6-24 HRS): SARS Coronavirus 2: NEGATIVE

## 2019-11-07 ENCOUNTER — Ambulatory Visit (AMBULATORY_SURGERY_CENTER): Payer: Federal, State, Local not specified - PPO | Admitting: Gastroenterology

## 2019-11-07 ENCOUNTER — Encounter: Payer: Self-pay | Admitting: Gastroenterology

## 2019-11-07 ENCOUNTER — Other Ambulatory Visit: Payer: Self-pay

## 2019-11-07 VITALS — BP 140/80 | HR 57 | Temp 97.3°F | Resp 18 | Ht 62.0 in | Wt 213.0 lb

## 2019-11-07 DIAGNOSIS — D122 Benign neoplasm of ascending colon: Secondary | ICD-10-CM

## 2019-11-07 DIAGNOSIS — Z1211 Encounter for screening for malignant neoplasm of colon: Secondary | ICD-10-CM | POA: Diagnosis not present

## 2019-11-07 DIAGNOSIS — D120 Benign neoplasm of cecum: Secondary | ICD-10-CM

## 2019-11-07 DIAGNOSIS — Z86010 Personal history of colonic polyps: Secondary | ICD-10-CM

## 2019-11-07 MED ORDER — SODIUM CHLORIDE 0.9 % IV SOLN: 500.0000 mL | Freq: Once | INTRAVENOUS | Status: DC

## 2019-11-07 NOTE — Progress Notes (Signed)
pt tolerated well. VSS. awake and to recovery. Report given to RN.  

## 2019-11-07 NOTE — Progress Notes (Signed)
Called to room to assist during endoscopic procedure.  Patient ID and intended procedure confirmed with present staff. Received instructions for my participation in the procedure from the performing physician.  

## 2019-11-07 NOTE — Op Note (Signed)
[place] Patient Name: Katherine Sanchez Procedure Date: 11/07/2019 9:16 AM MRN: [id number] Endoscopist: Remo Lipps P. Havery Moros , MD Age: 64 Referring MD:  Date of Birth: [birth year] Gender: Female Account #: [id number] Procedure:                Colonoscopy Indications:              High risk colon cancer surveillance: Personal                            history of colonic polyps (9 removed in 2018),                            mother with history of colon cancer diagnosed age                            52s Medicines:                Monitored Anesthesia Care Procedure:                Pre-Anesthesia Assessment:                           - Prior to the procedure, a History and Physical                            was performed, and patient medications and                            allergies were reviewed. The patient's tolerance of                            previous anesthesia was also reviewed. The risks                            and benefits of the procedure and the sedation                            options and risks were discussed with the patient.                            All questions were answered, and informed consent                            was obtained. Prior Anticoagulants: The patient has                            taken no previous anticoagulant or antiplatelet                            agents. ASA Grade Assessment: II - A patient with                            mild systemic disease. After reviewing the risks  and benefits, the patient was deemed in                            satisfactory condition to undergo the procedure.                           After obtaining informed consent, the colonoscope                            was passed under direct vision. Throughout the                            procedure, the patient's blood pressure, pulse, and                            oxygen saturations were monitored continuously. The                          Colonoscope was introduced through the anus and                            advanced to the the cecum, identified by                            appendiceal orifice and ileocecal valve. The                            colonoscopy was performed without difficulty. The                            patient tolerated the procedure well. The quality                            of the bowel preparation was good. The ileocecal                            valve, appendiceal orifice, and rectum were                            photographed. Scope In: 9:22:06 AM Scope Out: 9:46:56 AM Scope Withdrawal Time: 0 hours 20 minutes 0 seconds  Total Procedure Duration: 0 hours 24 minutes 50 seconds  Findings:                 The perianal and digital rectal examinations were                            normal.                           A 5 mm polyp was found in the cecum. The polyp was                            flat. The polyp was removed with a cold snare.  Resection and retrieval were complete.                           Two sessile polyps were found in the ascending                            colon. The polyps were 3 mm in size. These polyps                            were removed with a cold snare. Resection and                            retrieval were complete.                           Multiple medium-mouthed diverticula were found in                            the sigmoid colon.                           The colon was tortous. The exam was otherwise                            without abnormality. Complications:            No immediate complications. Estimated blood loss:                            Minimal. Estimated Blood Loss:     Estimated blood loss was minimal. Impression:               - One 5 mm polyp in the cecum, removed with a cold                            snare. Resected and retrieved.                           - Two 3 mm polyps in the ascending  colon, removed                            with a cold snare. Resected and retrieved.                           - Diverticulosis in the sigmoid colon.                           - Tortous colon.                           - The examination was otherwise normal. Recommendation:           - Patient has a contact number available for                            emergencies. The signs and symptoms of potential  delayed complications were discussed with the                            patient. Return to normal activities tomorrow.                            Written discharge instructions were provided to the                            patient.                           - Resume previous diet.                           - Continue present medications.                           - Await pathology results. Remo Lipps P. Dartanian Knaggs, MD 11/07/2019 9:51:18 AM This report has been signed electronically.

## 2019-11-07 NOTE — Patient Instructions (Signed)
Handouts given: Diverticulosis, Polyps Resume previous  Diet Continue current medications Await pathology results Make office visit   YOU HAD AN ENDOSCOPIC PROCEDURE TODAY AT [facility]:   Refer to the procedure report that was given to you for any specific questions about what was found during the examination.  If the procedure report does not answer your questions, please call your gastroenterologist to clarify.  If you requested that your care partner not be given the details of your procedure findings, then the procedure report has been included in a sealed envelope for you to review at your convenience later.  YOU SHOULD EXPECT: Some feelings of bloating in the abdomen. Passage of more gas than usual.  Walking can help get rid of the air that was put into your GI tract during the procedure and reduce the bloating. If you had a lower endoscopy (such as a colonoscopy or flexible sigmoidoscopy) you may notice spotting of blood in your stool or on the toilet paper. If you underwent a bowel prep for your procedure, you may not have a normal bowel movement for a few days.  Please Note:  You might notice some irritation and congestion in your nose or some drainage.  This is from the oxygen used during your procedure.  There is no need for concern and it should clear up in a day or so.  SYMPTOMS TO REPORT IMMEDIATELY:   Following lower endoscopy (colonoscopy or flexible sigmoidoscopy):  Excessive amounts of blood in the stool  Significant tenderness or worsening of abdominal pains  Swelling of the abdomen that is new, acute  Fever of 100F or higher  For urgent or emergent issues, a gastroenterologist can be reached at any hour by calling [phone]. Do not use MyChart messaging for urgent concerns.    DIET:  We do recommend a small meal at first, but then you may proceed to your regular diet.  Drink plenty of fluids but you should avoid alcoholic beverages for 24  hours.  ACTIVITY:  You should plan to take it easy for the rest of today and you should NOT DRIVE or use heavy machinery until tomorrow (because of the sedation medicines used during the test).    FOLLOW UP: Our staff will call the number listed on your records 48-72 hours following your procedure to check on you and address any questions or concerns that you may have regarding the information given to you following your procedure. If we do not reach you, we will leave a message.  We will attempt to reach you two times.  During this call, we will ask if you have developed any symptoms of COVID 19. If you develop any symptoms (ie: fever, flu-like symptoms, shortness of breath, cough etc.) before then, please call [phone].  If you test positive for Covid 19 in the 2 weeks post procedure, please call and report this information to [place].    If any biopsies were taken you will be contacted by phone or by letter within the next 1-3 weeks.  Please call us at [phone] if you have not heard about the biopsies in 3 weeks.    SIGNATURES/CONFIDENTIALITY: You and/or your care partner have signed paperwork which will be entered into your electronic medical record.  These signatures attest to the fact that that the information above on your After Visit Summary has been reviewed and is understood.  Full responsibility of the confidentiality of this discharge information lies with you and/or your care-partner.

## 2019-11-07 NOTE — Progress Notes (Signed)
Pt's states no medical or surgical changes since previsit or office visit.  CW vitals. BC Iv and LC temp.

## 2019-11-08 ENCOUNTER — Telehealth: Payer: Self-pay

## 2019-11-08 NOTE — Telephone Encounter (Signed)
Katherine Sanchez did you mean to send me a message regarding this patient or what is a type-o? Thanks

## 2019-11-09 ENCOUNTER — Telehealth: Payer: Self-pay | Admitting: *Deleted

## 2019-11-09 NOTE — Telephone Encounter (Signed)
1. Have you developed a fever since your procedure? no  2.   Have you had an respiratory symptoms (SOB or cough) since your procedure? no  3.   Have you tested positive for COVID 19 since your procedure no  4.   Have you had any family members/close contacts diagnosed with the COVID 19 since your procedure?  no   If yes to any of these questions please route to Joylene John, RN and Alphonsa Gin, Therapist, sports.  Follow up Call-  Call back number 11/07/2019  Post procedure Call Back phone  # [phone]  Permission to leave phone message Yes  Some recent data might be hidden     Patient questions:  Do you have a fever, pain , or abdominal swelling? No. Pain Score  0 *  Have you tolerated food without any problems? Yes.    Have you been able to return to your normal activities? Yes.    Do you have any questions about your discharge instructions: Diet   No. Medications  No. Follow up visit  No.  Do you have questions or concerns about your Care? No.  Actions: * If pain score is 4 or above: No action needed, pain <4.

## 2019-11-10 DIAGNOSIS — H00019 Hordeolum externum unspecified eye, unspecified eyelid: Secondary | ICD-10-CM | POA: Diagnosis not present

## 2019-11-10 DIAGNOSIS — H05011 Cellulitis of right orbit: Secondary | ICD-10-CM | POA: Diagnosis not present

## 2019-12-20 ENCOUNTER — Other Ambulatory Visit (INDEPENDENT_AMBULATORY_CARE_PROVIDER_SITE_OTHER): Payer: Federal, State, Local not specified - PPO

## 2019-12-20 ENCOUNTER — Ambulatory Visit: Payer: Federal, State, Local not specified - PPO | Admitting: Gastroenterology

## 2019-12-20 ENCOUNTER — Encounter: Payer: Self-pay | Admitting: Gastroenterology

## 2019-12-20 VITALS — BP 118/70 | HR 67 | Temp 98.5°F | Ht 62.0 in | Wt 212.0 lb

## 2019-12-20 DIAGNOSIS — Z86010 Personal history of colonic polyps: Secondary | ICD-10-CM

## 2019-12-20 DIAGNOSIS — R1033 Periumbilical pain: Secondary | ICD-10-CM

## 2019-12-20 DIAGNOSIS — Z8719 Personal history of other diseases of the digestive system: Secondary | ICD-10-CM

## 2019-12-20 DIAGNOSIS — Z9889 Other specified postprocedural states: Secondary | ICD-10-CM

## 2019-12-20 LAB — BUN: BUN: 13 mg/dL (ref 6–23)

## 2019-12-20 LAB — CREATININE, SERUM: Creatinine, Ser: 0.64 mg/dL (ref 0.40–1.20)

## 2019-12-20 NOTE — Progress Notes (Signed)
 HPI :  64-year-old female here for a follow-up visit for abdominal pain.  I know her from recent colonoscopy on March 16.  At that point time she was found to have a small cecal polyp and 2 small polyps in the ascending colon that were consistent with adenomas/sessile serrated polyps.  She also had some diverticulosis in the sigmoid colon.  She reminds me that her mother had colon cancer in her 70s.  She states she had an umbilical hernia repair in 2012.  She has been having some recurrent pain in her umbilicus for the past year or so.  She had lost some weight prepandemic but has since gained it back, trying to lose weight again.  She endorses some sense of constant discomfort in that area.  It does not really radiate anywhere.  Feels like a chronic soreness and is tender.  She denies any relation of her pain to eating or drinking anything.  Pain does not appear positional.  Sometimes a bowel movement makes her pain better.  She does not note any obvious recurrence of the hernia.  She otherwise states that she had some loose stools that were bothering her before the colonoscopy, however since the procedure her bowels are back to normal and not really bothering her.  She wonders if she is sensitive to the Metformin she takes at times.  Otherwise feels well without complaints.  She is most concerned about the status of the mesh from her umbilical hernia repair  Colonoscopy 11/18/16 - The perianal and digital rectal examinations were normal. - Two sessile polyps were found in the cecum. The polyps were 4 to 5 mm in size. These polyps were removed with a cold snare. Resection and retrieval were complete. - Five sessile polyps were found in the ascending colon. The polyps were 4 to 6 mm in size. These polyps were removed with a cold snare. Resection and retrieval were complete. - Two sessile polyps were found in the sigmoid colon. The polyps were 5 to 6 mm in size. These polyps were removed with a cold  snare. Resection and retrieval were complete. - Multiple medium-mouthed diverticula were found in the left colon. - Internal hemorrhoids were found during retroflexion. - The colon was tortous. The exam was otherwise without abnormality.  Path - all adenomas  Colonoscopy 11/07/19 - The perianal and digital rectal examinations were normal. - A 5 mm polyp was found in the cecum. The polyp was flat. The polyp was removed with a cold snare. Resection and retrieval were complete. - Two sessile polyps were found in the ascending colon. The polyps were 3 mm in size. These polyps were removed with a cold snare. Resection and retrieval were complete. - Multiple medium-mouthed diverticula were found in the sigmoid colon. - The colon was tortous. The exam was otherwise without abnormality.  - path adenoma / sessile serrated - repeat exam in 3-5 years   Past Medical History:  Diagnosis Date  . Abdominal distension   . Allergy    per pt  . Anxiety   . Arthritis    knees   . Constipation   . Diabetes mellitus without complication (HCC)   . GERD (gastroesophageal reflux disease)   . Heart murmur   . Hyperlipidemia   . Hypertension   . Hypothyroidism   . Incisional hernia   . Pneumonia    hx of 2006   . Seasonal allergies   . Sleep apnea    cpap x 10 yrs   Dr [name]   . Thyroid disease      Past Surgical History:  Procedure Laterality Date  . APPENDECTOMY  1982  . CARDIAC CATHETERIZATION     1999 and negative  . CHOLECYSTECTOMY  2010   lap with open redo umbilical hernia repair.  Dr. [name]  . COLONOSCOPY  11/18/2016  . HERNIA REPAIR  0932   umbilical open w mesh plug. Dr. [name] and 2010 Dr [name]   . KNEE SURGERY  2006   left - arthroscopic  . VENTRAL HERNIA REPAIR  08/13/2011   Procedure: LAPAROSCOPIC VENTRAL HERNIA;  Surgeon: Adin Hector, MD;  Location: [facility];  Service: General;  Laterality: N/A;  Laparoscopic Ventral Hernia Repair with Mesh   Family History    Problem Relation Age of Onset  . Cancer Mother        colon  . Heart disease Mother 40       congestive heart failure  . COPD Mother   . Colon cancer Mother 10  . Other Father 31       car accident  . Cancer Maternal Grandmother        ovarian  . Asthma Son   . Allergies Son   . Hypertension Paternal Grandmother   . Cancer Paternal Grandfather        prostate  . Breast cancer Neg Hx   . Colon polyps Neg Hx   . Esophageal cancer Neg Hx   . Rectal cancer Neg Hx   . Stomach cancer Neg Hx    Social History   Tobacco Use  . Smoking status: Former Smoker    Packs/day: 0.10    Years: 4.00    Pack years: 0.40    Types: Cigarettes    Quit date: 07/07/1978    Years since quitting: 41.4  . Smokeless tobacco: Never Used  . Tobacco comment: occassional smoker with alcohol consumption  Substance Use Topics  . Alcohol use: No    Comment: quit 1979  . Drug use: No   Current Outpatient Medications  Medication Sig Dispense Refill  . amLODipine-benazepril (LOTREL) 10-20 MG capsule TAKE 1 CAPSULE BY MOUTH EVERY DAY 90 capsule 3  . Ascorbic Acid (VITAMIN C) POWD Take 1,000 mg by mouth daily.     Marland Kitchen aspirin 81 MG tablet Take 81 mg by mouth daily.    . blood glucose meter kit and supplies KIT Dispense based on patient and insurance preference. Use up to one time as directed  (FOR ICD-9 250.00, 250.01). 1 each 0  . docusate sodium (COLACE) 100 MG capsule Take 100 mg by mouth 2 (two) times daily as needed.      . fluticasone (FLONASE) 50 MCG/ACT nasal spray Use 2 sprays in both nostrils every day 16 g 5  . glucose blood (ONE TOUCH ULTRA TEST) test strip USE UP TO 1 TIME AS DIRECTED 25 each 3  . ibuprofen (ADVIL,MOTRIN) 200 MG tablet Take 200 mg by mouth every 6 (six) hours as needed.      Marland Kitchen levothyroxine (SYNTHROID) 50 MCG tablet Take one by mouth daily 90 tablet 3  . meloxicam (MOBIC) 15 MG tablet Take 1 tablet (15 mg total) by mouth daily as needed for pain. 90 tablet 0  . metFORMIN  (GLUCOPHAGE) 1000 MG tablet Take 1 tablet (1,000 mg total) by mouth 2 (two) times daily with a meal. 180 tablet 3  . OneTouch Delica Lancets 35T MISC USE UP TO 1 TIME AS DIRECTED 100 each 11  .  TRULICITY 0.75 MG/0.5ML SOPN INJECT 0.75 MG INTO THE SKIN ONCE A WEEK 6 pen 1   No current facility-administered medications for this visit.   Allergies  Allergen Reactions  . Amoxicillin     hives hives hives     Review of Systems: All systems reviewed and negative except where noted in HPI.   Lab Results  Component Value Date   WBC 6.7 09/26/2019   HGB 14.6 09/26/2019   HCT 45.9 09/26/2019   MCV 86 09/26/2019   PLT 335 09/26/2019   Lab Results  Component Value Date   CREATININE 0.64 12/20/2019   BUN 13 12/20/2019   NA 138 09/26/2019   K 4.9 09/26/2019   CL 101 09/26/2019   CO2 19 (L) 09/26/2019    Lab Results  Component Value Date   ALT 33 (H) 09/26/2019   AST 29 09/26/2019   ALKPHOS 111 09/26/2019   BILITOT 0.3 09/26/2019     Physical Exam: BP 118/70   Pulse 67   Temp 98.5 F (36.9 C)   Ht 5' 2" (1.575 m)   Wt 212 lb (96.2 kg)   BMI 38.78 kg/m  Constitutional: Pleasant,well-developed, female in no acute distress. HEENT: Normocephalic and atraumatic. Conjunctivae are normal. No scleral icterus. Neck supple.  Cardiovascular: Normal rate, regular rhythm.  Pulmonary/chest: Effort normal and breath sounds normal. No wheezing, rales or rhonchi. Abdominal: Soft, protuberant, focal tenderness periumbilical area and LLQ. There are no masses palpable. (+) diastasis recti in mid abdomen but no obvious periumbilical hernia recurrence.   Extremities: no edema Lymphadenopathy: No cervical adenopathy noted. Neurological: Alert and oriented to person place and time. Skin: Skin is warm and dry. No rashes noted. Psychiatric: Normal mood and affect. Behavior is normal.   ASSESSMENT AND PLAN: 64-year-old female here for reassessment of the following:  Periumbilical pain -   she has had this for the past year and feels some sense of chronic discomfort as outlined.  On exam she is fairly tender there as well as has some tenderness in the left left abdomen around the same level.  I do not appreciate an obvious recurrence of her umbilical hernia however she does have a diastases recti superior to the umbilicus.  I discussed differential with her.  Is quite possible this could be nerve impingement or musculoskeletal/abdominal wall pain at the site of her prior surgery, however I offered her CT scan to provide reassurance and that nothing else is causing an issue.  She wanted to proceed with a CT scan.  If there is pathology there to cause her pain will address it.  If the exam is entirely normal, we may bring her back for trigger point injection to see if that provides any benefit.  She agreed with the plan, will contact her with results.  History of colon polyps - patient prefers to have 5-year recall after discussion of options, placed for March 2026  Steven Armbruster, MD [facility]   

## 2019-12-20 NOTE — Patient Instructions (Addendum)
If you are age 64 or older, your body mass index should be between 23-30. Your Body mass index is 38.78 kg/m. If this is out of the aforementioned range listed, please consider follow up with your Primary Care Provider.  If you are age 7 or younger, your body mass index should be between 19-25. Your Body mass index is 38.78 kg/m. If this is out of the aformentioned range listed, please consider follow up with your Primary Care Provider.   _________________________________________________________________  Katherine Sanchez have been scheduled for a CT scan of the abdomen and pelvis at [facility] ([address]---this is in the same building as [employer]).   You are scheduled on Tuesday, 12-26-19 at 3:00pm. You should arrive 15 minutes prior to your appointment time for registration. Please follow the written instructions below on the day of your exam:  WARNING: IF YOU ARE ALLERGIC TO IODINE/X-RAY DYE, PLEASE NOTIFY RADIOLOGY IMMEDIATELY AT [phone]! YOU WILL BE GIVEN A 13 HOUR PREMEDICATION PREP.  1) Do not eat anything after 11:00am (4 hours prior to your test) 2) You have been given 2 bottles of oral contrast to drink. The solution may taste better if refrigerated, but do NOT add ice or any other liquid to this solution. Shake well before drinking.    Drink 1 bottle of contrast @ 1:00pm (2 hours prior to your exam)  Drink 1 bottle of contrast @ 2:00pm (1 hour prior to your exam)  You may take any medications as prescribed with a small amount of water, if necessary. If you take any of the following medications: METFORMIN, GLUCOPHAGE, GLUCOVANCE, AVANDAMET, RIOMET, FORTAMET, [place] MET, JANUMET, GLUMETZA or METAGLIP, you MAY be asked to HOLD this medication 48 hours AFTER the exam.  The purpose of you drinking the oral contrast is to aid in the visualization of your intestinal tract. The contrast solution may cause some diarrhea. Depending on your individual set of symptoms,  you may also receive an intravenous injection of x-ray contrast/dye. Plan on being at [facility] for 30 minutes or longer, depending on the type of exam you are having performed.  This test typically takes 30-45 minutes to complete.  If you have any questions regarding your exam or if you need to reschedule, you may call the CT department at [phone] between the hours of 8:00 am and 5:00 pm, Monday-Friday.  ______________________________________________________________________  Please go to the lab in the basement of our building to have lab work done as you leave today. Hit "B" for basement when you get on the elevator.  When the doors open the lab is on your left.  We will call you with the results. Thank you.   You will be due for a recall colonoscopy in 10-2024. We will send you a reminder in the mail when it gets closer to that time.   Thank you for entrusting me with your care and for choosing [facility], Dr. Elburn Cellar

## 2019-12-26 ENCOUNTER — Inpatient Hospital Stay: Admission: RE | Admit: 2019-12-26 | Payer: Federal, State, Local not specified - PPO | Source: Ambulatory Visit

## 2019-12-26 DIAGNOSIS — Z20822 Contact with and (suspected) exposure to covid-19: Secondary | ICD-10-CM | POA: Diagnosis not present

## 2019-12-29 ENCOUNTER — Other Ambulatory Visit: Payer: Self-pay

## 2019-12-29 ENCOUNTER — Telehealth: Payer: Self-pay

## 2019-12-29 ENCOUNTER — Ambulatory Visit
Admission: EM | Admit: 2019-12-29 | Discharge: 2019-12-29 | Disposition: A | Discharge status: Home / Self Care | Payer: Federal, State, Local not specified - PPO | Attending: Emergency Medicine | Admitting: Emergency Medicine

## 2019-12-29 ENCOUNTER — Inpatient Hospital Stay: Admission: RE | Admit: 2019-12-29 | Payer: Federal, State, Local not specified - PPO | Source: Ambulatory Visit

## 2019-12-29 ENCOUNTER — Other Ambulatory Visit: Payer: Self-pay | Admitting: Emergency Medicine

## 2019-12-29 DIAGNOSIS — J209 Acute bronchitis, unspecified: Secondary | ICD-10-CM

## 2019-12-29 DIAGNOSIS — Z889 Allergy status to unspecified drugs, medicaments and biological substances status: Secondary | ICD-10-CM

## 2019-12-29 MED ORDER — ALBUTEROL SULFATE HFA 108 (90 BASE) MCG/ACT IN AERS
2.0000 | INHALATION_SPRAY | RESPIRATORY_TRACT | 0 refills | Status: DC | PRN
Start: 2019-12-29 — End: 2020-05-06

## 2019-12-29 MED ORDER — FLUTICASONE PROPIONATE 50 MCG/ACT NA SUSP
1.0000 | Freq: Every day | NASAL | 0 refills | Status: DC
Start: 2019-12-29 — End: 2020-11-05

## 2019-12-29 MED ORDER — CETIRIZINE HCL 10 MG PO TABS
10.0000 mg | ORAL_TABLET | Freq: Every day | ORAL | 0 refills | Status: DC
Start: 2019-12-29 — End: 2020-05-06

## 2019-12-29 MED ORDER — AEROCHAMBER PLUS FLO-VU MEDIUM MISC: 1.0000 | Freq: Once | 0 refills | Status: AC

## 2019-12-29 MED ORDER — BENZONATATE 100 MG PO CAPS
100.0000 mg | ORAL_CAPSULE | Freq: Three times a day (TID) | ORAL | 0 refills | Status: DC
Start: 2019-12-29 — End: 2020-03-26

## 2019-12-29 MED ORDER — PREDNISONE 20 MG PO TABS
20.0000 mg | ORAL_TABLET | Freq: Every day | ORAL | 0 refills | Status: DC
Start: 2019-12-29 — End: 2020-12-13

## 2019-12-29 NOTE — ED Provider Notes (Signed)
[facility]    CSN: [id number] Arrival date & time: 12/29/19  1448      History   Chief Complaint Chief Complaint  Patient presents with  . Cough    HPI Katherine Sanchez is a 64 y.o. female with history of hypertension, heart murmur, hypothyroidism, diabetes, allergies, sleep apnea presenting for cough since Monday.  States is productive with green sputum: No blood, wheezing.  Patient does have increased congestion/subjective difficulty breathing at bedtime.  Denies lower extremity edema, chest pain, palpitations.  No fever, chills, arthralgias, myalgias.  Good p.o. intake without emesis, diarrhea, change in urinary habit.  Has taken daytime cold medicine with some relief.  Underwent Covid testing at external UC site: Negative PCR.   Past Medical History:  Diagnosis Date  . Abdominal distension   . Allergy    per pt  . Anxiety   . Arthritis    knees   . Constipation   . Diabetes mellitus without complication ([place])   . GERD (gastroesophageal reflux disease)   . Heart murmur   . Hyperlipidemia   . Hypertension   . Hypothyroidism   . Incisional hernia   . Pneumonia    hx of 2006   . Seasonal allergies   . Sleep apnea    cpap x 10 yrs Dr [name]   . Thyroid disease     Patient Active Problem List   Diagnosis Date Noted  . Vitamin B12 deficiency 02/22/2018  . Type 2 diabetes mellitus without complication, with long-term current use of insulin ([place]) 06/22/2017  . Acquired hypothyroidism 06/22/2017  . OSA (obstructive sleep apnea) 06/28/2013  . Obesity (BMI 30-39.9) 07/08/2011  . Essential hypertension, benign 10/17/2007  . Chronic ischemic heart disease 10/17/2007  . Hypertension associated with type 2 diabetes mellitus ([place]) 10/14/2007  . Recurrent ventral incisional hernia, periumbilical 21/22/4825  . Osteoarthritis 10/14/2007  . ABNORMAL GLUCOSE NEC 10/14/2007    Past Surgical History:  Procedure Laterality Date  . APPENDECTOMY  1982  . CARDIAC  CATHETERIZATION     1999 and negative  . CHOLECYSTECTOMY  2010   lap with open redo umbilical hernia repair.  Dr. [name]  . COLONOSCOPY  11/18/2016  . HERNIA REPAIR  0037   umbilical open w mesh plug. Dr. [name] and 2010 Dr [name]   . KNEE SURGERY  2006   left - arthroscopic  . VENTRAL HERNIA REPAIR  08/13/2011   Procedure: LAPAROSCOPIC VENTRAL HERNIA;  Surgeon: Adin Hector, MD;  Location: [facility];  Service: General;  Laterality: N/A;  Laparoscopic Ventral Hernia Repair with Mesh    OB History   No obstetric history on file.      Home Medications    Prior to Admission medications   Medication Sig Start Date End Date Taking? Authorizing Provider  albuterol (VENTOLIN HFA) 108 (90 Base) MCG/ACT inhaler Inhale 2 puffs into the lungs every 4 (four) hours as needed for wheezing or shortness of breath. 12/29/19   Hall-Potvin, [place], PA-C  amLODipine-benazepril (LOTREL) 10-20 MG capsule TAKE 1 CAPSULE BY MOUTH EVERY DAY 04/27/19   Horald Pollen, MD  Ascorbic Acid (VITAMIN C) POWD Take 1,000 mg by mouth daily.     [provider]  aspirin 81 MG tablet Take 81 mg by mouth daily.    [provider]  benzonatate (TESSALON) 100 MG capsule Take 1 capsule (100 mg total) by mouth every 8 (eight) hours. 12/29/19   Hall-Potvin, [place], PA-C  blood glucose meter kit and  supplies KIT Dispense based on patient and insurance preference. Use up to one time as directed  (FOR ICD-9 250.00, 250.01). 03/24/15   Le, [name], DO  cetirizine (ZYRTEC ALLERGY) 10 MG tablet Take 1 tablet (10 mg total) by mouth daily. 12/29/19   Hall-Potvin, [place], PA-C  docusate sodium (COLACE) 100 MG capsule Take 100 mg by mouth 2 (two) times daily as needed.      [provider]  fluticasone (FLONASE) 50 MCG/ACT nasal spray Place 1 spray into both nostrils daily. 12/29/19   Hall-Potvin, [place], PA-C  glucose blood (ONE TOUCH ULTRA TEST) test strip USE UP TO 1 TIME AS DIRECTED 04/27/19    Horald Pollen, MD  ibuprofen (ADVIL,MOTRIN) 200 MG tablet Take 200 mg by mouth every 6 (six) hours as needed.      [provider]  levothyroxine (SYNTHROID) 50 MCG tablet Take one by mouth daily 04/27/19   Horald Pollen, MD  meloxicam (MOBIC) 15 MG tablet Take 1 tablet (15 mg total) by mouth daily as needed for pain. 08/05/16   Shawnee Knapp, MD  metFORMIN (GLUCOPHAGE) 1000 MG tablet Take 1 tablet (1,000 mg total) by mouth 2 (two) times daily with a meal. 04/27/19   [place], Ines Bloomer, MD  OneTouch Delica Lancets 02I MISC USE UP TO 1 TIME AS DIRECTED 04/27/19   Horald Pollen, MD  predniSONE (DELTASONE) 20 MG tablet Take 1 tablet (20 mg total) by mouth daily. 12/29/19   Hall-Potvin, [place], PA-C  Spacer/Aero-Holding Chambers (AEROCHAMBER PLUS FLO-VU MEDIUM) MISC 1 each by Other route once for 1 dose. 12/29/19 12/29/19  Hall-Potvin, [place], PA-C  TRULICITY 0.97 DZ/3.2DJ SOPN INJECT 0.75 MG INTO THE SKIN ONCE A WEEK 10/07/19   Horald Pollen, MD    Family History Family History  Problem Relation Age of Onset  . Cancer Mother        colon  . Heart disease Mother 42       congestive heart failure  . COPD Mother   . Colon cancer Mother 77  . Other Father 82       car accident  . Cancer Maternal Grandmother        ovarian  . Asthma Son   . Allergies Son   . Hypertension Paternal Grandmother   . Cancer Paternal Grandfather        prostate  . Breast cancer Neg Hx   . Colon polyps Neg Hx   . Esophageal cancer Neg Hx   . Rectal cancer Neg Hx   . Stomach cancer Neg Hx     Social History Social History   Tobacco Use  . Smoking status: Former Smoker    Packs/day: 0.10    Years: 4.00    Pack years: 0.40    Types: Cigarettes    Quit date: 07/07/1978    Years since quitting: 41.5  . Smokeless tobacco: Never Used  . Tobacco comment: occassional smoker with alcohol consumption  Substance Use Topics  . Alcohol use: No    Comment: quit 1979  . Drug use:  No     Allergies   Amoxicillin   Review of Systems As per HPI   Physical Exam Triage Vital Signs ED Triage Vitals  Enc Vitals Group     BP      Pulse      Resp      Temp      Temp src      SpO2  Weight      Height      Head Circumference      Peak Flow      Pain Score      Pain Loc      Pain Edu?      Excl. in [place]?    No data found.  Updated Vital Signs BP (!) 146/88 (BP Location: Left Arm)   Pulse 76   Temp 98.1 F (36.7 C) (Oral)   Resp 18   SpO2 93%   Visual Acuity Right Eye Distance:   Left Eye Distance:   Bilateral Distance:    Right Eye Near:   Left Eye Near:    Bilateral Near:     Physical Exam Constitutional:      General: She is not in acute distress.    Appearance: She is obese. She is not ill-appearing or diaphoretic.  HENT:     Head: Normocephalic and atraumatic.     Mouth/Throat:     Mouth: Mucous membranes are moist.     Pharynx: Oropharynx is clear. No oropharyngeal exudate or posterior oropharyngeal erythema.  Eyes:     General: No scleral icterus.    Conjunctiva/sclera: Conjunctivae normal.     Pupils: Pupils are equal, round, and reactive to light.  Neck:     Comments: Trachea midline, negative JVD Cardiovascular:     Rate and Rhythm: Normal rate and regular rhythm.     Heart sounds: Murmur present. No gallop.   Pulmonary:     Effort: Pulmonary effort is normal. No respiratory distress.     Breath sounds: No wheezing, rhonchi or rales.  Musculoskeletal:     Cervical back: Neck supple. No tenderness.  Lymphadenopathy:     Cervical: No cervical adenopathy.  Skin:    Capillary Refill: Capillary refill takes less than 2 seconds.     Coloration: Skin is not jaundiced or pale.     Findings: No rash.  Neurological:     General: No focal deficit present.     Mental Status: She is alert and oriented to person, place, and time.      UC Treatments / Results  Labs (all labs ordered are listed, but only abnormal results  are displayed) Labs Reviewed - No data to display  EKG   Radiology No results found.  Procedures Procedures (including critical care time)  Medications Ordered in UC Medications - No data to display  Initial Impression / Assessment and Plan / UC Course  I have reviewed the triage vital signs and the nursing notes.  Pertinent labs & imaging results that were available during my care of the patient were reviewed by me and considered in my medical decision making (see chart for details).     Patient afebrile, nontoxic in office today.  Patient is hypertensive, though has not yet taken her blood pressure medications today: States she takes them in the evening.  Cardiopulmonary exam reassuring.  Per chart review POCT A1c from 09/26/2019: 5.7%.  We will treat for acute bronchitis as outlined below; have low threshold to return for imaging.  Return precautions discussed, patient verbalized understanding and is agreeable to plan. Final Clinical Impressions(s) / UC Diagnoses   Final diagnoses:  Acute bronchitis, unspecified organism     Discharge Instructions     Tessalon for cough. Start flonase, atrovent nasal spray for nasal congestion/drainage. You can use over the counter nasal saline rinse such as neti pot for nasal congestion. Keep hydrated, your urine should be clear  to pale yellow in color. Tylenol/motrin for fever and pain. Monitor for any worsening of symptoms, chest pain, shortness of breath, wheezing, swelling of the throat, go to the emergency department for further evaluation needed.     ED Prescriptions    Medication Sig Dispense Auth. Provider   predniSONE (DELTASONE) 20 MG tablet Take 1 tablet (20 mg total) by mouth daily. 5 tablet Hall-Potvin, [place], PA-C   benzonatate (TESSALON) 100 MG capsule Take 1 capsule (100 mg total) by mouth every 8 (eight) hours. 21 capsule Hall-Potvin, [place], PA-C   albuterol (VENTOLIN HFA) 108 (90 Base) MCG/ACT inhaler Inhale 2 puffs  into the lungs every 4 (four) hours as needed for wheezing or shortness of breath. 18 g Hall-Potvin, [place], PA-C   Spacer/Aero-Holding Chambers (AEROCHAMBER PLUS FLO-VU MEDIUM) MISC 1 each by Other route once for 1 dose. 1 each Hall-Potvin, [place], PA-C   cetirizine (ZYRTEC ALLERGY) 10 MG tablet Take 1 tablet (10 mg total) by mouth daily. 30 tablet Hall-Potvin, [place], PA-C   fluticasone (FLONASE) 50 MCG/ACT nasal spray Place 1 spray into both nostrils daily. 16 g Hall-Potvin, [place], PA-C     PDMP not reviewed this encounter.   Hall-Potvin, [place], [place] 12/29/19 1532

## 2019-12-29 NOTE — Discharge Instructions (Signed)

## 2019-12-29 NOTE — ED Triage Notes (Signed)
Pt c/o productive cough with green sputum and nasal congestion since Sunday. States went to med clinic on Tuesday, had a neg covid test but wasn't tx'd.

## 2020-01-01 NOTE — Telephone Encounter (Signed)
No action needed, closing encounter from inbox

## 2020-01-05 ENCOUNTER — Ambulatory Visit (INDEPENDENT_AMBULATORY_CARE_PROVIDER_SITE_OTHER)
Admission: RE | Admit: 2020-01-05 | Discharge: 2020-01-05 | Disposition: A | Discharge status: Home / Self Care | Payer: Federal, State, Local not specified - PPO | Source: Ambulatory Visit | Attending: Gastroenterology | Admitting: Gastroenterology

## 2020-01-05 ENCOUNTER — Other Ambulatory Visit: Payer: Self-pay

## 2020-01-05 DIAGNOSIS — R1033 Periumbilical pain: Secondary | ICD-10-CM

## 2020-01-05 DIAGNOSIS — R109 Unspecified abdominal pain: Secondary | ICD-10-CM | POA: Diagnosis not present

## 2020-01-05 MED ORDER — IOHEXOL 300 MG/ML  SOLN
100.0000 mL | Freq: Once | INTRAMUSCULAR | Status: AC | PRN
  Administered 2020-01-05: 100 mL via INTRAVENOUS

## 2020-01-09 ENCOUNTER — Telehealth: Payer: Self-pay | Admitting: Gastroenterology

## 2020-01-09 NOTE — Telephone Encounter (Signed)
See results note for additional details 

## 2020-01-09 NOTE — Telephone Encounter (Signed)
Pls call pt, she would like to speak with you about injections that Dr. Havery Moros is recommending for her.

## 2020-02-29 ENCOUNTER — Ambulatory Visit: Payer: Federal, State, Local not specified - PPO | Admitting: Gastroenterology

## 2020-03-26 ENCOUNTER — Encounter: Payer: Self-pay | Admitting: Emergency Medicine

## 2020-03-26 ENCOUNTER — Ambulatory Visit: Payer: Federal, State, Local not specified - PPO | Admitting: Emergency Medicine

## 2020-03-26 ENCOUNTER — Other Ambulatory Visit: Payer: Self-pay

## 2020-03-26 VITALS — BP 120/76 | HR 67 | Temp 98.7°F | Resp 16 | Ht 62.0 in | Wt 212.0 lb

## 2020-03-26 DIAGNOSIS — Z794 Long term (current) use of insulin: Secondary | ICD-10-CM | POA: Diagnosis not present

## 2020-03-26 DIAGNOSIS — Z6838 Body mass index (BMI) 38.0-38.9, adult: Secondary | ICD-10-CM

## 2020-03-26 DIAGNOSIS — I10 Essential (primary) hypertension: Secondary | ICD-10-CM

## 2020-03-26 DIAGNOSIS — Z789 Other specified health status: Secondary | ICD-10-CM

## 2020-03-26 DIAGNOSIS — E119 Type 2 diabetes mellitus without complications: Secondary | ICD-10-CM

## 2020-03-26 MED ORDER — ONETOUCH DELICA LANCETS 33G MISC: 11 refills | Status: DC

## 2020-03-26 MED ORDER — TRULICITY 0.75 MG/0.5ML ~~LOC~~ SOAJ: SUBCUTANEOUS | 6 refills | Status: DC

## 2020-03-26 NOTE — Progress Notes (Addendum)
Katherine Sanchez 64 y.o.   Chief Complaint  Patient presents with   Diabetes    follow up 6 month   Medication Refill    Trulicity    HISTORY OF PRESENT ILLNESS: This is a 64 y.o. female with history of diabetes here for follow-up and medication refill. Presently on Metformin 1000 mg twice a day and Trulicity 6.26 mg weekly.  Doing well.  Blood sugars at home between 90 and 110. Has no complaints or medical concerns today. Also has a history of hypertension on Lotrel 10-20 mg.  HPI   Prior to Admission medications   Medication Sig Start Date End Date Taking? Authorizing Provider  albuterol (VENTOLIN HFA) 108 (90 Base) MCG/ACT inhaler Inhale 2 puffs into the lungs every 4 (four) hours as needed for wheezing or shortness of breath. 12/29/19  Yes Hall-Potvin, [place], PA-C  amLODipine-benazepril (LOTREL) 10-20 MG capsule TAKE 1 CAPSULE BY MOUTH EVERY DAY 04/27/19  Yes Elaya Droege, Ines Bloomer, MD  Ascorbic Acid (VITAMIN C) POWD Take 1,000 mg by mouth daily.    Yes [provider]  aspirin 81 MG tablet Take 81 mg by mouth daily.   Yes [provider]  blood glucose meter kit and supplies KIT Dispense based on patient and insurance preference. Use up to one time as directed  (FOR ICD-9 250.00, 250.01). 03/24/15  Yes Le, [name], DO  cetirizine (ZYRTEC ALLERGY) 10 MG tablet Take 1 tablet (10 mg total) by mouth daily. 12/29/19  Yes Hall-Potvin, [place], PA-C  docusate sodium (COLACE) 100 MG capsule Take 100 mg by mouth 2 (two) times daily as needed.     Yes [provider]  fluticasone (FLONASE) 50 MCG/ACT nasal spray Place 1 spray into both nostrils daily. 12/29/19  Yes Hall-Potvin, [place], PA-C  ibuprofen (ADVIL,MOTRIN) 200 MG tablet Take 200 mg by mouth every 6 (six) hours as needed.     Yes [provider]  levothyroxine (SYNTHROID) 50 MCG tablet Take one by mouth daily 04/27/19  Yes Jerrika Ledlow, Ines Bloomer, MD  meloxicam (MOBIC) 15 MG tablet Take 1 tablet (15 mg  total) by mouth daily as needed for pain. 08/05/16  Yes Shawnee Knapp, MD  metFORMIN (GLUCOPHAGE) 1000 MG tablet Take 1 tablet (1,000 mg total) by mouth 2 (two) times daily with a meal. 04/27/19  Yes Kayann Maj, Ines Bloomer, MD  TRULICITY 9.48 NI/6.2VO SOPN INJECT 0.75 MG INTO THE SKIN ONCE A WEEK 10/07/19  Yes Toma Erichsen, Ines Bloomer, MD  glucose blood (ONE TOUCH ULTRA TEST) test strip USE UP TO 1 TIME AS DIRECTED 04/27/19   Horald Pollen, MD  OneTouch Delica Lancets 35K MISC USE UP TO 1 TIME AS DIRECTED 04/27/19   Horald Pollen, MD  predniSONE (DELTASONE) 20 MG tablet Take 1 tablet (20 mg total) by mouth daily. Patient not taking: Reported on 03/26/2020 12/29/19   Hall-Potvin, [place], [place]    Allergies  Allergen Reactions   Amoxicillin     hives hives hives    Patient Active Problem List   Diagnosis Date Noted   Vitamin B12 deficiency 02/22/2018   Type 2 diabetes mellitus without complication, with long-term current use of insulin ([place]) 06/22/2017   Acquired hypothyroidism 06/22/2017   OSA (obstructive sleep apnea) 06/28/2013   Obesity (BMI 30-39.9) 07/08/2011   Essential hypertension, benign 10/17/2007   Chronic ischemic heart disease 10/17/2007   Hypertension associated with type 2 diabetes mellitus ([place]) 10/14/2007   Recurrent ventral incisional hernia, periumbilical 09/38/1829   Osteoarthritis 10/14/2007  ABNORMAL GLUCOSE NEC 10/14/2007    Past Medical History:  Diagnosis Date   Abdominal distension    Allergy    per pt   Anxiety    Arthritis    knees    Constipation    Diabetes mellitus without complication (HCC)    GERD (gastroesophageal reflux disease)    Heart murmur    Hyperlipidemia    Hypertension    Hypothyroidism    Incisional hernia    Pneumonia    hx of 2006    Seasonal allergies    Sleep apnea    cpap x 10 yrs Dr [name]    Thyroid disease     Past Surgical History:  Procedure Laterality Date   [place] and negative   CHOLECYSTECTOMY  2010   lap with open redo umbilical hernia repair.  Dr. [name]   COLONOSCOPY  11/18/2016   HERNIA REPAIR  9450   umbilical open w mesh plug. Dr. 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   Occupation: care taker  Tobacco Use   Smoking status: Former Smoker    Packs/day: 0.10    Years: 4.00    Pack years: 0.40    Types: Cigarettes    Quit date: 07/07/1978    Years since quitting: 41.7   Smokeless tobacco: Never Used   Tobacco comment: occassional smoker with alcohol consumption  Vaping Use   Vaping Use: Never used  Substance and Sexual Activity   Alcohol use: No    Comment: quit 1979   Drug use: No   Sexual activity: Yes    Birth control/protection: None    Comment: number of sex partners in the last 69 months 1  Other Topics Concern   Not on file  Social History Narrative   Lives with husband and his mother is currently living with them after sustaining a fall in Oct. 2013.   Exercise walking 2 times daily   Social Determinants of Health   Financial Resource Strain:    Difficulty of Paying Living Expenses:   Food Insecurity:    Worried About Charity fundraiser in the Last Year:    Arboriculturist in the Last Year:   Transportation Needs:    Film/video editor (Medical):    Lack of Transportation (Non-Medical):   Physical Activity:    Days of Exercise per Week:    Minutes of Exercise per Session:   Stress:     Feeling of Stress :   Social Connections:    Frequency of Communication with Friends and Family:    Frequency of Social Gatherings with Friends and Family:    Attends Religious Services:    Active Member of Clubs or Organizations:    Attends Archivist Meetings:    Marital Status:   Intimate Partner Violence:    Fear of Current  or Ex-Partner:    Emotionally Abused:    Physically Abused:    Sexually Abused:     Family History  Problem Relation Age of Onset   Cancer Mother        colon   Heart disease Mother 38       congestive heart failure   COPD Mother    Colon cancer Mother 67   Other Father 61       car accident   Cancer Maternal Grandmother        ovarian   Asthma Son    Allergies Son    Hypertension Paternal Grandmother    Cancer Paternal Grandfather        prostate   Breast cancer Neg Hx    Colon polyps Neg Hx    Esophageal cancer Neg Hx    Rectal cancer Neg Hx    Stomach cancer Neg Hx      Review of Systems  Constitutional: Negative.  Negative for chills and fever.  HENT: Negative.  Negative for congestion and sore throat.   Respiratory: Negative.  Negative for cough and shortness of breath.   Cardiovascular: Negative.  Negative for chest pain and palpitations.  Gastrointestinal: Negative for abdominal pain, diarrhea, nausea and vomiting.  Genitourinary: Negative.  Negative for dysuria and hematuria.  Musculoskeletal: Negative.  Negative for myalgias and neck pain.  Skin: Negative.  Negative for rash.  Neurological: Negative for dizziness and headaches.  Endo/Heme/Allergies: Negative.   All other systems reviewed and are negative.  Today's Vitals   03/26/20 1057  BP: 120/76  Pulse: 67  Resp: 16  Temp: 98.7 F (37.1 C)  TempSrc: Temporal  SpO2: 96%  Weight: 212 lb (96.2 kg)  Height: _0  (1.575 m)   Body mass index is 38.78 kg/m.   Physical Exam Vitals reviewed.  Constitutional:      Appearance: Normal appearance.  HENT:      Head: Normocephalic.  Eyes:     Extraocular Movements: Extraocular movements intact.     Conjunctiva/sclera: Conjunctivae normal.     Pupils: Pupils are equal, round, and reactive to light.  Cardiovascular:     Rate and Rhythm: Normal rate and regular rhythm.     Pulses: Normal pulses.     Heart sounds: Murmur heard.  Systolic murmur is present with a grade of 3/6.  Pulmonary:     Effort: Pulmonary effort is normal.     Breath sounds: Normal breath sounds.  Musculoskeletal:        General: Normal range of motion.     Cervical back: Normal range of motion and neck supple.  Skin:    General: Skin is warm and dry.  Neurological:     General: No focal deficit present.     Mental Status: She is alert and oriented to person, place, and time.  Psychiatric:        Mood and Affect: Mood normal.        Behavior: Behavior normal.     ASSESSMENT & PLAN: Lakenzie was seen today for diabetes and medication refill.  Diagnoses and all orders for this visit:  Type 2 diabetes mellitus without complication, with long-term current use of insulin (HCC) -     CBC with Differential/Platelet -     Comprehensive metabolic panel -     Hemoglobin A1c -     HM Diabetes Foot Exam -     Dulaglutide (TRULICITY) 9.76 BH/4.1PF SOPN; INJECT 0.75 MG INTO THE SKIN ONCE  A WEEK -     OneTouch Delica Lancets 54U MISC; USE UP TO 1 TIME AS DIRECTED  Class 2 severe obesity due to excess calories with serious comorbidity and body mass index (BMI) of 38.0 to 38.9 in adult (HCC)  Body mass index (BMI) of 38.0-38.9 in adult  Essential hypertension  Statin intolerance    Patient Instructions       If you have lab work done today you will be contacted with your lab results within the next 2 weeks.  If you have not heard from [place] then please contact us. The fastest way to get your results is to register for My Chart.   IF you received an x-ray today, you will receive an invoice from [facility].  Please contact [facility] at [phone] with questions or concerns regarding your invoice.   IF you received labwork today, you will receive an invoice from [place]. Please contact [facility] at [phone] with questions or concerns regarding your invoice.   Our billing staff will not be able to assist you with questions regarding bills from these companies.  You will be contacted with the lab results as soon as they are available. The fastest way to get your results is to activate your My Chart account. Instructions are located on the last page of this paperwork. If you have not heard from [place] regarding the results in 2 weeks, please contact this office.     Health Maintenance, Female Adopting a healthy lifestyle and getting preventive care are important in promoting health and wellness. Ask your health care provider about: The right schedule for you to have regular tests and exams. Things you can do on your own to prevent diseases and keep yourself healthy. What should I know about diet, weight, and exercise? Eat a healthy diet  Eat a diet that includes plenty of vegetables, fruits, low-fat dairy products, and lean protein. Do not eat a lot of foods that are high in solid fats, added sugars, or sodium. Maintain a healthy weight Body mass index (BMI) is used to identify weight problems. It estimates body fat based on height and weight. Your health care provider can help determine your BMI and help you achieve or maintain a healthy weight. Get regular exercise Get regular exercise. This is one of the most important things you can do for your health. Most adults should: Exercise for at least 150 minutes each week. The exercise should increase your heart rate and make you sweat (moderate-intensity exercise). Do strengthening exercises at least twice a week. This is in addition to the moderate-intensity exercise. Spend less time sitting. Even light physical activity can be  beneficial. Watch cholesterol and blood lipids Have your blood tested for lipids and cholesterol at 64 years of age, then have this test every 5 years. Have your cholesterol levels checked more often if: Your lipid or cholesterol levels are high. You are older than 64 years of age. You are at high risk for heart disease. What should I know about cancer screening? Depending on your health history and family history, you may need to have cancer screening at various ages. This may include screening for: Breast cancer. Cervical cancer. Colorectal cancer. Skin cancer. Lung cancer. What should I know about heart disease, diabetes, and high blood pressure? Blood pressure and heart disease High blood pressure causes heart disease and increases the risk of stroke. This is more likely to develop in people who have high blood pressure readings, are of African descent,  or are overweight. Have your blood pressure checked: Every 3-5 years if you are 29-17 years of age. Every year if you are 61 years old or older. Diabetes Have regular diabetes screenings. This checks your fasting blood sugar level. Have the screening done: Once every three years after age 6 if you are at a normal weight and have a low risk for diabetes. More often and at a younger age if you are overweight or have a high risk for diabetes. What should I know about preventing infection? Hepatitis B If you have a higher risk for hepatitis B, you should be screened for this virus. Talk with your health care provider to find out if you are at risk for hepatitis B infection. Hepatitis C Testing is recommended for: Everyone born from [birth year] through 1965. Anyone with known risk factors for hepatitis C. Sexually transmitted infections (STIs) Get screened for STIs, including gonorrhea and chlamydia, if: You are sexually active and are younger than 64 years of age. You are older than 64 years of age and your health care provider tells you  that you are at risk for this type of infection. Your sexual activity has changed since you were last screened, and you are at increased risk for chlamydia or gonorrhea. Ask your health care provider if you are at risk. Ask your health care provider about whether you are at high risk for HIV. Your health care provider may recommend a prescription medicine to help prevent HIV infection. If you choose to take medicine to prevent HIV, you should first get tested for HIV. You should then be tested every 3 months for as long as you are taking the medicine. Pregnancy If you are about to stop having your period (premenopausal) and you may become pregnant, seek counseling before you get pregnant. Take 400 to 800 micrograms (mcg) of folic acid every day if you become pregnant. Ask for birth control (contraception) if you want to prevent pregnancy. Osteoporosis and menopause Osteoporosis is a disease in which the bones lose minerals and strength with aging. This can result in bone fractures. If you are 62 years old or older, or if you are at risk for osteoporosis and fractures, ask your health care provider if you should: Be screened for bone loss. Take a calcium or vitamin D supplement to lower your risk of fractures. Be given hormone replacement therapy (HRT) to treat symptoms of menopause. Follow these instructions at home: Lifestyle Do not use any products that contain nicotine or tobacco, such as cigarettes, e-cigarettes, and chewing tobacco. If you need help quitting, ask your health care provider. Do not use street drugs. Do not share needles. Ask your health care provider for help if you need support or information about quitting drugs. Alcohol use Do not drink alcohol if: Your health care provider tells you not to drink. You are pregnant, may be pregnant, or are planning to become pregnant. If you drink alcohol: Limit how much you use to 0-1 drink a day. Limit intake if you are  breastfeeding. Be aware of how much alcohol is in your drink. In the [place] one drink equals one 12 oz bottle of beer (355 mL), one 5 oz glass of wine (148 mL), or one 1 oz glass of hard liquor (44 mL). General instructions Schedule regular health, dental, and eye exams. Stay current with your vaccines. Tell your health care provider if: You often feel depressed. You have ever been abused or do not feel safe at home. Summary  Adopting a healthy lifestyle and getting preventive care are important in promoting health and wellness. Follow your health care provider's instructions about healthy diet, exercising, and getting tested or screened for diseases. Follow your health care provider's instructions on monitoring your cholesterol and blood pressure. This information is not intended to replace advice given to you by your health care provider. Make sure you discuss any questions you have with your health care provider. Document Revised: 08/03/2018 Document Reviewed: 08/03/2018 Elsevier Patient Education  [place].  Diabetes Mellitus and Nutrition, Adult When you have diabetes (diabetes mellitus), it is very important to have healthy eating habits because your blood sugar (glucose) levels are greatly affected by what you eat and drink. Eating healthy foods in the appropriate amounts, at about the same times every day, can help you: Control your blood glucose. Lower your risk of heart disease. Improve your blood pressure. Reach or maintain a healthy weight. Every person with diabetes is different, and each person has different needs for a meal plan. Your health care provider may recommend that you work with a diet and nutrition specialist (dietitian) to make a meal plan that is best for you. Your meal plan may vary depending on factors such as: The calories you need. The medicines you take. Your weight. Your blood glucose, blood pressure, and cholesterol levels. Your activity  level. Other health conditions you have, such as heart or kidney disease. How do carbohydrates affect me? Carbohydrates, also called carbs, affect your blood glucose level more than any other type of food. Eating carbs naturally raises the amount of glucose in your blood. Carb counting is a method for keeping track of how many carbs you eat. Counting carbs is important to keep your blood glucose at a healthy level, especially if you use insulin or take certain oral diabetes medicines. It is important to know how many carbs you can safely have in each meal. This is different for every person. Your dietitian can help you calculate how many carbs you should have at each meal and for each snack. Foods that contain carbs include: Bread, cereal, rice, pasta, and crackers. Potatoes and corn. Peas, beans, and lentils. Milk and yogurt. Fruit and juice. Desserts, such as cakes, cookies, ice cream, and candy. How does alcohol affect me? Alcohol can cause a sudden decrease in blood glucose (hypoglycemia), especially if you use insulin or take certain oral diabetes medicines. Hypoglycemia can be a life-threatening condition. Symptoms of hypoglycemia (sleepiness, dizziness, and confusion) are similar to symptoms of having too much alcohol. If your health care provider says that alcohol is safe for you, follow these guidelines: Limit alcohol intake to no more than 1 drink per day for nonpregnant women and 2 drinks per day for men. One drink equals 12 oz of beer, 5 oz of wine, or 1 oz of hard liquor. Do not drink on an empty stomach. Keep yourself hydrated with water, diet soda, or unsweetened iced tea. Keep in mind that regular soda, juice, and other mixers may contain a lot of sugar and must be counted as carbs. What are tips for following this plan?  Reading food labels Start by checking the serving size on the "Nutrition Facts" label of packaged foods and drinks. The amount of calories, carbs, fats, and  other nutrients listed on the label is based on one serving of the item. Many items contain more than one serving per package. Check the total grams (g) of carbs in one serving. You can calculate  the number of servings of carbs in one serving by dividing the total carbs by 15. For example, if a food has 30 g of total carbs, it would be equal to 2 servings of carbs. Check the number of grams (g) of saturated and trans fats in one serving. Choose foods that have low or no amount of these fats. Check the number of milligrams (mg) of salt (sodium) in one serving. Most people should limit total sodium intake to less than 2,300 mg per day. Always check the nutrition information of foods labeled as "low-fat" or "nonfat". These foods may be higher in added sugar or refined carbs and should be avoided. Talk to your dietitian to identify your daily goals for nutrients listed on the label. Shopping Avoid buying canned, premade, or processed foods. These foods tend to be high in fat, sodium, and added sugar. Shop around the outside edge of the grocery store. This includes fresh fruits and vegetables, bulk grains, fresh meats, and fresh dairy. Cooking Use low-heat cooking methods, such as baking, instead of high-heat cooking methods like deep frying. Cook using healthy oils, such as olive, canola, or sunflower oil. Avoid cooking with butter, cream, or high-fat meats. Meal planning Eat meals and snacks regularly, preferably at the same times every day. Avoid going long periods of time without eating. Eat foods high in fiber, such as fresh fruits, vegetables, beans, and whole grains. Talk to your dietitian about how many servings of carbs you can eat at each meal. Eat 4-6 ounces (oz) of lean protein each day, such as lean meat, chicken, fish, eggs, or tofu. One oz of lean protein is equal to: 1 oz of meat, chicken, or fish. 1 egg.  cup of tofu. Eat some foods each day that contain healthy fats, such as  avocado, nuts, seeds, and fish. Lifestyle Check your blood glucose regularly. Exercise regularly as told by your health care provider. This may include: 150 minutes of moderate-intensity or vigorous-intensity exercise each week. This could be brisk walking, biking, or water aerobics. Stretching and doing strength exercises, such as yoga or weightlifting, at least 2 times a week. Take medicines as told by your health care provider. Do not use any products that contain nicotine or tobacco, such as cigarettes and e-cigarettes. If you need help quitting, ask your health care provider. Work with a Social worker or diabetes educator to identify strategies to manage stress and any emotional and social challenges. Questions to ask a health care provider Do I need to meet with a diabetes educator? Do I need to meet with a dietitian? What number can I call if I have questions? When are the best times to check my blood glucose? Where to find more information: American Diabetes Association: diabetes.org Academy of Nutrition and Dietetics: www.eatright.[employer] of Diabetes and Digestive and Kidney Diseases (NIH): [employer] Summary A healthy meal plan will help you control your blood glucose and maintain a healthy lifestyle. Working with a diet and nutrition specialist (dietitian) can help you make a meal plan that is best for you. Keep in mind that carbohydrates (carbs) and alcohol have immediate effects on your blood glucose levels. It is important to count carbs and to use alcohol carefully. This information is not intended to replace advice given to you by your health care provider. Make sure you discuss any questions you have with your health care provider. Document Revised: 07/23/2017 Document Reviewed: 09/14/2016 Elsevier Patient Education  2020 [place]  Mitchel Honour, MD [facility]

## 2020-03-26 NOTE — Patient Instructions (Addendum)
If you have lab work done today you will be contacted with your lab results within the next 2 weeks.  If you have not heard from [place] then please contact us. The fastest way to get your results is to register for My Chart.   IF you received an x-ray today, you will receive an invoice from [facility]. Please contact [facility] at [phone] with questions or concerns regarding your invoice.   IF you received labwork today, you will receive an invoice from [facility]. Please contact [facility] at [phone] with questions or concerns regarding your invoice.   Our billing staff will not be able to assist you with questions regarding bills from these companies.  You will be contacted with the lab results as soon as they are available. The fastest way to get your results is to activate your My Chart account. Instructions are located on the last page of this paperwork. If you have not heard from [place] regarding the results in 2 weeks, please contact this office.     Health Maintenance, Female Adopting a healthy lifestyle and getting preventive care are important in promoting health and wellness. Ask your health care provider about:  The right schedule for you to have regular tests and exams.  Things you can do on your own to prevent diseases and keep yourself healthy. What should I know about diet, weight, and exercise? Eat a healthy diet   Eat a diet that includes plenty of vegetables, fruits, low-fat dairy products, and lean protein.  Do not eat a lot of foods that are high in solid fats, added sugars, or sodium. Maintain a healthy weight Body mass index (BMI) is used to identify weight problems. It estimates body fat based on height and weight. Your health care provider can help determine your BMI and help you achieve or maintain a healthy weight. Get regular exercise Get regular exercise. This is one of the most important things you can do for your health. Most  adults should:  Exercise for at least 150 minutes each week. The exercise should increase your heart rate and make you sweat (moderate-intensity exercise).  Do strengthening exercises at least twice a week. This is in addition to the moderate-intensity exercise.  Spend less time sitting. Even light physical activity can be beneficial. Watch cholesterol and blood lipids Have your blood tested for lipids and cholesterol at 64 years of age, then have this test every 5 years. Have your cholesterol levels checked more often if:  Your lipid or cholesterol levels are high.  You are older than 64 years of age.  You are at high risk for heart disease. What should I know about cancer screening? Depending on your health history and family history, you may need to have cancer screening at various ages. This may include screening for:  Breast cancer.  Cervical cancer.  Colorectal cancer.  Skin cancer.  Lung cancer. What should I know about heart disease, diabetes, and high blood pressure? Blood pressure and heart disease  High blood pressure causes heart disease and increases the risk of stroke. This is more likely to develop in people who have high blood pressure readings, are of African descent, or are overweight.  Have your blood pressure checked: ? Every 3-5 years if you are 11-6 years of age. ? Every year if you are 99 years old or older. Diabetes Have regular diabetes screenings. This checks your fasting blood sugar level. Have the screening done:  Once every three  years after age 48 if you are at a normal weight and have a low risk for diabetes.  More often and at a younger age if you are overweight or have a high risk for diabetes. What should I know about preventing infection? Hepatitis B If you have a higher risk for hepatitis B, you should be screened for this virus. Talk with your health care provider to find out if you are at risk for hepatitis B infection. Hepatitis  C Testing is recommended for:  Everyone born from [birth year] through 1965.  Anyone with known risk factors for hepatitis C. Sexually transmitted infections (STIs)  Get screened for STIs, including gonorrhea and chlamydia, if: ? You are sexually active and are younger than 64 years of age. ? You are older than 64 years of age and your health care provider tells you that you are at risk for this type of infection. ? Your sexual activity has changed since you were last screened, and you are at increased risk for chlamydia or gonorrhea. Ask your health care provider if you are at risk.  Ask your health care provider about whether you are at high risk for HIV. Your health care provider may recommend a prescription medicine to help prevent HIV infection. If you choose to take medicine to prevent HIV, you should first get tested for HIV. You should then be tested every 3 months for as long as you are taking the medicine. Pregnancy  If you are about to stop having your period (premenopausal) and you may become pregnant, seek counseling before you get pregnant.  Take 400 to 800 micrograms (mcg) of folic acid every day if you become pregnant.  Ask for birth control (contraception) if you want to prevent pregnancy. Osteoporosis and menopause Osteoporosis is a disease in which the bones lose minerals and strength with aging. This can result in bone fractures. If you are 52 years old or older, or if you are at risk for osteoporosis and fractures, ask your health care provider if you should:  Be screened for bone loss.  Take a calcium or vitamin D supplement to lower your risk of fractures.  Be given hormone replacement therapy (HRT) to treat symptoms of menopause. Follow these instructions at home: Lifestyle  Do not use any products that contain nicotine or tobacco, such as cigarettes, e-cigarettes, and chewing tobacco. If you need help quitting, ask your health care provider.  Do not use street  drugs.  Do not share needles.  Ask your health care provider for help if you need support or information about quitting drugs. Alcohol use  Do not drink alcohol if: ? Your health care provider tells you not to drink. ? You are pregnant, may be pregnant, or are planning to become pregnant.  If you drink alcohol: ? Limit how much you use to 0-1 drink a day. ? Limit intake if you are breastfeeding.  Be aware of how much alcohol is in your drink. In the [place] one drink equals one 12 oz bottle of beer (355 mL), one 5 oz glass of wine (148 mL), or one 1 oz glass of hard liquor (44 mL). General instructions  Schedule regular health, dental, and eye exams.  Stay current with your vaccines.  Tell your health care provider if: ? You often feel depressed. ? You have ever been abused or do not feel safe at home. Summary  Adopting a healthy lifestyle and getting preventive care are important in promoting health and wellness.  Follow your health care provider's instructions about healthy diet, exercising, and getting tested or screened for diseases.  Follow your health care provider's instructions on monitoring your cholesterol and blood pressure. This information is not intended to replace advice given to you by your health care provider. Make sure you discuss any questions you have with your health care provider. Document Revised: 08/03/2018 Document Reviewed: 08/03/2018 Elsevier Patient Education  [place].  Diabetes Mellitus and Nutrition, Adult When you have diabetes (diabetes mellitus), it is very important to have healthy eating habits because your blood sugar (glucose) levels are greatly affected by what you eat and drink. Eating healthy foods in the appropriate amounts, at about the same times every day, can help you:  Control your blood glucose.  Lower your risk of heart disease.  Improve your blood pressure.  Reach or maintain a healthy weight. Every person with  diabetes is different, and each person has different needs for a meal plan. Your health care provider may recommend that you work with a diet and nutrition specialist (dietitian) to make a meal plan that is best for you. Your meal plan may vary depending on factors such as:  The calories you need.  The medicines you take.  Your weight.  Your blood glucose, blood pressure, and cholesterol levels.  Your activity level.  Other health conditions you have, such as heart or kidney disease. How do carbohydrates affect me? Carbohydrates, also called carbs, affect your blood glucose level more than any other type of food. Eating carbs naturally raises the amount of glucose in your blood. Carb counting is a method for keeping track of how many carbs you eat. Counting carbs is important to keep your blood glucose at a healthy level, especially if you use insulin or take certain oral diabetes medicines. It is important to know how many carbs you can safely have in each meal. This is different for every person. Your dietitian can help you calculate how many carbs you should have at each meal and for each snack. Foods that contain carbs include:  Bread, cereal, rice, pasta, and crackers.  Potatoes and corn.  Peas, beans, and lentils.  Milk and yogurt.  Fruit and juice.  Desserts, such as cakes, cookies, ice cream, and candy. How does alcohol affect me? Alcohol can cause a sudden decrease in blood glucose (hypoglycemia), especially if you use insulin or take certain oral diabetes medicines. Hypoglycemia can be a life-threatening condition. Symptoms of hypoglycemia (sleepiness, dizziness, and confusion) are similar to symptoms of having too much alcohol. If your health care provider says that alcohol is safe for you, follow these guidelines:  Limit alcohol intake to no more than 1 drink per day for nonpregnant women and 2 drinks per day for men. One drink equals 12 oz of beer, 5 oz of wine, or 1 oz  of hard liquor.  Do not drink on an empty stomach.  Keep yourself hydrated with water, diet soda, or unsweetened iced tea.  Keep in mind that regular soda, juice, and other mixers may contain a lot of sugar and must be counted as carbs. What are tips for following this plan?  Reading food labels  Start by checking the serving size on the "Nutrition Facts" label of packaged foods and drinks. The amount of calories, carbs, fats, and other nutrients listed on the label is based on one serving of the item. Many items contain more than one serving per package.  Check the total grams (g)  of carbs in one serving. You can calculate the number of servings of carbs in one serving by dividing the total carbs by 15. For example, if a food has 30 g of total carbs, it would be equal to 2 servings of carbs.  Check the number of grams (g) of saturated and trans fats in one serving. Choose foods that have low or no amount of these fats.  Check the number of milligrams (mg) of salt (sodium) in one serving. Most people should limit total sodium intake to less than 2,300 mg per day.  Always check the nutrition information of foods labeled as "low-fat" or "nonfat". These foods may be higher in added sugar or refined carbs and should be avoided.  Talk to your dietitian to identify your daily goals for nutrients listed on the label. Shopping  Avoid buying canned, premade, or processed foods. These foods tend to be high in fat, sodium, and added sugar.  Shop around the outside edge of the grocery store. This includes fresh fruits and vegetables, bulk grains, fresh meats, and fresh dairy. Cooking  Use low-heat cooking methods, such as baking, instead of high-heat cooking methods like deep frying.  Cook using healthy oils, such as olive, canola, or sunflower oil.  Avoid cooking with butter, cream, or high-fat meats. Meal planning  Eat meals and snacks regularly, preferably at the same times every day.  Avoid going long periods of time without eating.  Eat foods high in fiber, such as fresh fruits, vegetables, beans, and whole grains. Talk to your dietitian about how many servings of carbs you can eat at each meal.  Eat 4-6 ounces (oz) of lean protein each day, such as lean meat, chicken, fish, eggs, or tofu. One oz of lean protein is equal to: ? 1 oz of meat, chicken, or fish. ? 1 egg. ?  cup of tofu.  Eat some foods each day that contain healthy fats, such as avocado, nuts, seeds, and fish. Lifestyle  Check your blood glucose regularly.  Exercise regularly as told by your health care provider. This may include: ? 150 minutes of moderate-intensity or vigorous-intensity exercise each week. This could be brisk walking, biking, or water aerobics. ? Stretching and doing strength exercises, such as yoga or weightlifting, at least 2 times a week.  Take medicines as told by your health care provider.  Do not use any products that contain nicotine or tobacco, such as cigarettes and e-cigarettes. If you need help quitting, ask your health care provider.  Work with a Social worker or diabetes educator to identify strategies to manage stress and any emotional and social challenges. Questions to ask a health care provider  Do I need to meet with a diabetes educator?  Do I need to meet with a dietitian?  What number can I call if I have questions?  When are the best times to check my blood glucose? Where to find more information:  American Diabetes Association: diabetes.org  Academy of Nutrition and Dietetics: www.eatright.[employer] of Diabetes and Digestive and Kidney Diseases (NIH): [employer] Summary  A healthy meal plan will help you control your blood glucose and maintain a healthy lifestyle.  Working with a diet and nutrition specialist (dietitian) can help you make a meal plan that is best for you.  Keep in mind that carbohydrates (carbs) and alcohol have  immediate effects on your blood glucose levels. It is important to count carbs and to use alcohol carefully. This information is not intended to  replace advice given to you by your health care provider. Make sure you discuss any questions you have with your health care provider. Document Revised: 07/23/2017 Document Reviewed: 09/14/2016 Elsevier Patient Education  2020 [employer].

## 2020-03-27 LAB — CBC WITH DIFFERENTIAL/PLATELET
Basophils Absolute: 0.1 10*3/uL (ref 0.0–0.2)
Basos: 1 %
EOS (ABSOLUTE): 0.3 10*3/uL (ref 0.0–0.4)
Eos: 6 %
Hematocrit: 43.1 % (ref 34.0–46.6)
Hemoglobin: 14.4 g/dL (ref 11.1–15.9)
Immature Grans (Abs): 0 10*3/uL (ref 0.0–0.1)
Immature Granulocytes: 0 %
Lymphocytes Absolute: 1.7 10*3/uL (ref 0.7–3.1)
Lymphs: 30 %
MCH: 27.7 pg (ref 26.6–33.0)
MCHC: 33.4 g/dL (ref 31.5–35.7)
MCV: 83 fL (ref 79–97)
Monocytes Absolute: 0.5 10*3/uL (ref 0.1–0.9)
Monocytes: 9 %
Neutrophils Absolute: 3 10*3/uL (ref 1.4–7.0)
Neutrophils: 54 %
Platelets: 268 10*3/uL (ref 150–450)
RBC: 5.2 x10E6/uL (ref 3.77–5.28)
RDW: 13.3 % (ref 11.7–15.4)
WBC: 5.6 10*3/uL (ref 3.4–10.8)

## 2020-03-27 LAB — COMPREHENSIVE METABOLIC PANEL
ALT: 38 IU/L — ABNORMAL HIGH (ref 0–32)
AST: 29 IU/L (ref 0–40)
Albumin/Globulin Ratio: 1.3 (ref 1.2–2.2)
Albumin: 4.3 g/dL (ref 3.8–4.8)
Alkaline Phosphatase: 102 IU/L (ref 48–121)
BUN/Creatinine Ratio: 19 (ref 12–28)
BUN: 12 mg/dL (ref 8–27)
Bilirubin Total: 0.3 mg/dL (ref 0.0–1.2)
CO2: 22 mmol/L (ref 20–29)
Calcium: 9.8 mg/dL (ref 8.7–10.3)
Chloride: 102 mmol/L (ref 96–106)
Creatinine, Ser: 0.63 mg/dL (ref 0.57–1.00)
GFR calc Af Amer: 110 mL/min/{1.73_m2} (ref >59)
GFR calc non Af Amer: 96 mL/min/{1.73_m2} (ref >59)
Globulin, Total: 3.3 g/dL (ref 1.5–4.5)
Glucose: 102 mg/dL — ABNORMAL HIGH (ref 65–99)
Potassium: 4.1 mmol/L (ref 3.5–5.2)
Sodium: 139 mmol/L (ref 134–144)
Total Protein: 7.6 g/dL (ref 6.0–8.5)

## 2020-03-27 LAB — HEMOGLOBIN A1C
Est. average glucose Bld gHb Est-mCnc: 120 mg/dL
Hgb A1c MFr Bld: 5.8 % — ABNORMAL HIGH (ref 4.8–5.6)

## 2020-05-06 ENCOUNTER — Other Ambulatory Visit: Payer: Self-pay

## 2020-05-06 ENCOUNTER — Ambulatory Visit: Payer: Federal, State, Local not specified - PPO | Admitting: Neurology

## 2020-05-06 ENCOUNTER — Encounter: Payer: Self-pay | Admitting: Neurology

## 2020-05-06 VITALS — BP 126/82 | HR 76 | Ht 62.0 in | Wt 211.0 lb

## 2020-05-06 DIAGNOSIS — Z9989 Dependence on other enabling machines and devices: Secondary | ICD-10-CM

## 2020-05-06 DIAGNOSIS — G4733 Obstructive sleep apnea (adult) (pediatric): Secondary | ICD-10-CM

## 2020-05-06 NOTE — Progress Notes (Signed)
Subjective:    Patient ID: Katherine Sanchez is a 64 y.o. female.  HPI     Interim history:   Ms. Katherine Sanchez is a 64 year old right-handed woman with an underlying medical history of arthritis, chronic constipation, reflux disease, hyperlipidemia, MVP, hypertension, hypothyroidism, history of pneumonia in 2006, hypothyroidism, and obesity, who presents for follow-up consultation of her obstructive sleep apnea, on AutoPap therapy. The patient is unaccompanied today and presents for her yearly checkup. I last saw her on 05/03/19, at which time she was compliant with her AutoPap.  She was overall doing well, holding up okay despite the pandemic.  She and her husband were taking care of her father and father-in-law.  She had had some weight gain. She was advised to routinely follow-up in 1 year.  Today, 05/06/2020: I reviewed her AutoPap compliance data from 04/06/2020 through 05/05/2020, which is a total of 30 days, during which time she used her machine every night with percent used days greater than 4 hours at 100%, indicating superb compliance with an average usage of 7 hours and 41 minutes, residual AHI at goal but at times borderline, average of 4.2/h for this month, 95th percentile of pressure at 14.2 cm with a range of 5 to 15 cm with EPR.  Leak on the low side with a 95th percentile at 2 L/min.  She reports doing well with her machine.  She would like to see if she can get a new machine.  Set up date according to our records is 07/14/2013.  Her DME company is [employer].  She has been using a small ResMed fullface mask.  She is typically up-to-date with supplies.  She is concerned about the cleanliness of the machine especially the inside parts.  She should be eligible for a new machine.  She does not go without using her CPAP typically.  She continues to benefit from it and is fully compliant with treatment.  She is working on weight loss.  Her father-in-law moved in with them as he had more serious health  issues and could not stay by himself any longer, he is almost 64 years old.  Her own father is independent.  She does help him out with his groceries.  The patient's allergies, current medications, family history, past medical history, past social history, past surgical history and problem list were reviewed and updated as appropriate.    Previously (copied from previous notes for reference):   I saw her on 04/27/2018, at which time she was compliant with her treatment.  She felt AutoPap was working well.  She was advised to talk to her DME company about her machine, as far as her eligibility for a new machine.  Other than that, she was advised to follow-up routinely in 1 year.     I reviewed her AutoPap compliance data from 04/03/2019 through 05/02/2019 which is a total of 30 days, during which time she used her machine every night with percent use days greater than 4 hours at 100%, indicating superb compliance with an average usage of 7 hours and 38 minutes, residual AHI at goal at 3.7/h, 95th percentile of her AutoPap pressure was 13.6 cm with a range of 5 cm to 15 cm with EPR.  Leak was on the high side fairly consistently with the exception of several days in a row in late August, 95th percentile of leak was 25.6 L/min.     I saw her on 03/24/2017, at which time she was fully compliant with AutoPap  therapy. She was trying to lose weight. She was advised to follow-up in one year routinely.   I reviewed her AutoPap compliance data from 03/28/2018 through 04/26/2014 which is a total of 30 days, during which time she used her AutoPap every night with percent used days greater than 4 hours at 100%, indicating superb compliance with an average usage of 7 hours and 16 minutes, residual AHI 4.3 per hour, 95th percentile of pressure at 13.8 cm, leak on the higher end with the 95th percentile at 23.3 L/m on a pressure range of 5 cm to 15 cm with EPR.    I first met her on 08/05/2016 at the request of her primary  care physician, at which time she reported a prior diagnosis of OSA and being on AutoPap therapy. She was compliant with treatment. She was encouraged to follow-up routinely in 6 months.   I reviewed her AutoPap compliance data from 02/22/2017 through 03/23/2014, which is a total of 30 days, during which time she used her machine every night with percent used days greater than 4 hours at 100%, indicating superb compliance with an average usage of 7 hours and 23 minutes, residual AHI at goal at 3.7 per hour, leak acceptable for the 95th percentile at 12.7 L/m and 95th percentile pressure at 14 cm, pressure range of 5-15 with EPR.    08/05/2016: (She) was previously diagnosed with obstructive sleep apnea and placed on PAP therapy. Sleep study test results are not available for my review, original diagnosis is probably more than 15 years ago. She has been on CPAP. I reviewed her compliance data from 07/06/2016 through 08/04/2016, which is a total of 30 days, during which time she used her machine every night with percent used days greater than 4 hours of 100%, indicating superb compliance with an average usage of 6 hours and 54 minutes, residual AHI 4.2 per hour, leak acceptable at 11 L/m on a pressure range of 5-15 auto, 95th percentile pressure at 14 cm. Her Epworth sleepiness score is 10 out of 24 today, her fatigue score is 25 out of 63.   I reviewed your office note from 07/02/2016. She had recently had an episode of acute headache, had a CT head at the time and I reviewed the results from her head CT without contrast on 06/12/2016: IMPRESSION: No evidence of acute intracranial abnormality.   Mild chronic small-vessel white matter ischemic changes.    She was treated symptomatically for her headache with Toradol and Fioricet and improved without recurrence of a similar headache. She denies any restless leg symptoms or leg twitching at night or parasomnias. She is generally in bed between 11:30 and  midnight and wake up time is around 6:45-7, she usually has to take the dog out. She lives at home with her husband. 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 She has 2 grown sons area she quit smoking and quit drinking alcohol in 1986. She was never heavy drinker. She drinks one cup of coffee each morning. She has been trying to lose weight. She denies any morning headaches. She has been using a fullface mask with good tolerance. She had some recent stressors in her family and felt more exhausted but things are settling down some now. She was told that she had moderate obstructive sleep apnea when she was originally diagnosed at age 41. This is her second CPAP machine. She has no concerns about using her machine.  She indicates full compliance and great results with using CPAP. In fact, she feels that she cannot sleep without it. When she first got diagnosed in her 27s, she had significant daytime somnolence and this improved tremendously when she was started on CPAP therapy.  Her Past Medical History Is Significant For: Past Medical History:  Diagnosis Date  . Abdominal distension   . Allergy    per pt  . Anxiety   . Arthritis    knees   . Constipation   . Diabetes mellitus without complication ([place])   . GERD (gastroesophageal reflux disease)   . Heart murmur   . Hyperlipidemia   . Hypertension   . Hypothyroidism   . Incisional hernia   . Pneumonia    hx of 2006   . Seasonal allergies   . Sleep apnea    cpap x 10 yrs Dr [name]   . Thyroid disease     Her Past Surgical History Is Significant For: Past Surgical History:  Procedure Laterality Date  . APPENDECTOMY  1982  . CARDIAC CATHETERIZATION     1999 and negative  . CHOLECYSTECTOMY  2010   lap with open redo umbilical hernia repair.  Dr. [name]  . COLONOSCOPY  11/18/2016  . HERNIA REPAIR  6712   umbilical open w mesh plug. Dr. [name] and 2010 Dr [name]   . KNEE SURGERY  2006    left - arthroscopic  . VENTRAL HERNIA REPAIR  08/13/2011   Procedure: LAPAROSCOPIC VENTRAL HERNIA;  Surgeon: Adin Hector, MD;  Location: [facility];  Service: General;  Laterality: N/A;  Laparoscopic Ventral Hernia Repair with Mesh    Her Family History Is Significant For: Family History  Problem Relation Age of Onset  . Cancer Mother        colon  . Heart disease Mother 54       congestive heart failure  . COPD Mother   . Colon cancer Mother 24  . Other Father 75       car accident  . Cancer Maternal Grandmother        ovarian  . Asthma Son   . Allergies Son   . Hypertension Paternal Grandmother   . Cancer Paternal Grandfather        prostate  . Breast cancer Neg Hx   . Colon polyps Neg Hx   . Esophageal cancer Neg Hx   . Rectal cancer Neg Hx   . Stomach cancer Neg Hx     Her Social History Is Significant For: Social History   Socioeconomic History  . Marital status: Married    Spouse name: Not on file  . Number of children: 2  . Years of education: Not on file  . Highest education level: Not on file  Occupational History  . 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 Occupation: care taker  Tobacco Use  . Smoking status: Former Smoker    Packs/day: 0.10    Years: 4.00    Pack years: 0.40    Types: Cigarettes    Quit date: 07/07/1978    Years since quitting: 41.8  . Smokeless tobacco: Never Used  . Tobacco comment: occassional smoker with alcohol consumption  Vaping Use  . Vaping Use: Never used  Substance and Sexual Activity  . Alcohol use: No    Comment: quit 1979  . Drug use: No  . Sexual activity: Yes    Birth control/protection: None    Comment: number of sex partners in the last  12 months 1  Other Topics Concern  . Not on file  Social History Narrative   Lives with husband and his mother is currently living with them after sustaining a fall in Oct. 2013.   Exercise walking 2 times daily   Social Determinants of Health   Financial  Resource Strain:   . Difficulty of Paying Living Expenses: Not on file  Food Insecurity:   . Worried About Charity fundraiser in the Last Year: Not on file  . Ran Out of Food in the Last Year: Not on file  Transportation Needs:   . Lack of Transportation (Medical): Not on file  . Lack of Transportation (Non-Medical): Not on file  Physical Activity:   . Days of Exercise per Week: Not on file  . Minutes of Exercise per Session: Not on file  Stress:   . Feeling of Stress : Not on file  Social Connections:   . Frequency of Communication with Friends and Family: Not on file  . Frequency of Social Gatherings with Friends and Family: Not on file  . Attends Religious Services: Not on file  . Active Member of Clubs or Organizations: Not on file  . Attends Archivist Meetings: Not on file  . Marital Status: Not on file    Her Allergies Are:  Allergies  Allergen Reactions  . Amoxicillin     hives hives hives  :   Her Current Medications Are:  Outpatient Encounter Medications as of 05/06/2020  Medication Sig  . amLODipine-benazepril (LOTREL) 10-20 MG capsule TAKE 1 CAPSULE BY MOUTH EVERY DAY  . Ascorbic Acid (VITAMIN C) POWD Take 1,000 mg by mouth daily.   Marland Kitchen aspirin 81 MG tablet Take 81 mg by mouth daily.  . blood glucose meter kit and supplies KIT Dispense based on patient and insurance preference. Use up to one time as directed  (FOR ICD-9 250.00, 250.01).  Marland Kitchen docusate sodium (COLACE) 100 MG capsule Take 100 mg by mouth 2 (two) times daily as needed.    . Dulaglutide (TRULICITY) 0.26 VZ/8.5YI SOPN INJECT 0.75 MG INTO THE SKIN ONCE A WEEK  . fluticasone (FLONASE) 50 MCG/ACT nasal spray Place 1 spray into both nostrils daily.  Marland Kitchen glucose blood (ONE TOUCH ULTRA TEST) test strip USE UP TO 1 TIME AS DIRECTED  . ibuprofen (ADVIL,MOTRIN) 200 MG tablet Take 200 mg by mouth every 6 (six) hours as needed.    Marland Kitchen levothyroxine (SYNTHROID) 50 MCG tablet Take one by mouth daily  .  meloxicam (MOBIC) 15 MG tablet Take 1 tablet (15 mg total) by mouth daily as needed for pain.  . metFORMIN (GLUCOPHAGE) 1000 MG tablet Take 1 tablet (1,000 mg total) by mouth 2 (two) times daily with a meal.  . OneTouch Delica Lancets 50Y MISC USE UP TO 1 TIME AS DIRECTED  . predniSONE (DELTASONE) 20 MG tablet Take 1 tablet (20 mg total) by mouth daily.  . [DISCONTINUED] albuterol (VENTOLIN HFA) 108 (90 Base) MCG/ACT inhaler Inhale 2 puffs into the lungs every 4 (four) hours as needed for wheezing or shortness of breath.  . [DISCONTINUED] cetirizine (ZYRTEC ALLERGY) 10 MG tablet Take 1 tablet (10 mg total) by mouth daily. (Patient not taking: Reported on 05/06/2020)   No facility-administered encounter medications on file as of 05/06/2020.  :  Review of Systems:  Out of a complete 14 point review of systems, all are reviewed and negative with the exception of these symptoms as listed below: Review of Systems  Neurological:       Here for 1 year f/u. Reports she has been doing well  Would like to discuss getting a new machine.      Objective:  Neurological Exam  Physical Exam Physical Examination:   Vitals:   05/06/20 1406  BP: 126/82  Pulse: 76  SpO2: 96%    General Examination: The patient is a very pleasant 64 y.o. female in no acute distress. She appears well-developed and well-nourished and well groomed.   HEENT:Normocephalic, atraumatic, pupils are equal, round and reactive to light and accommodation.She has corrective eyeglasses.Extraocular tracking is well preserved. Hearing is grossly intact. Face is symmetric with normal facial animation. Speech is clear with no dysarthria noted. There is no hypophonia. There is no lip, neck/head, jaw or voice tremor. Neckshows FROM.Oropharynx exam reveals: No significant changes, no Significant mouth dryness, tongue protrudes centrally in palate elevates symmetrically.  Chest:Clear to auscultation without wheezing, rhonchi or  crackles noted.  Heart:S1+S2+0, regular, no murmur noted.   Abdomen:Soft, non-tender and non-distended.  Extremities:There is nopitting edema in the distal lower extremities bilaterally.   Skin: Warm and dry without trophic changes noted. There are no varicose veins.  Musculoskeletal: exam reveals no obvious joint deformities, tenderness or joint swelling or erythema.   Neurologically:  Mental status: The patient is awake, alert and oriented in all 4 spheres. Herimmediate and remote memory, attention, language skills and fund of knowledge are appropriate. There is no evidence of aphasia, agnosia, apraxia or anomia. Speech is clear with normal prosody and enunciation. Thought process is linear. Mood is normaland affect is normal.  Cranial nerves II - XII are as described above under HEENT exam.  Motor exam: Normal bulk, strength and tone is noted. There is no tremor. Fine motor skills and coordination:Grossly intact. Sensory exam: intact to light touch in the upper and lower extremities.  Gait, station and balance: Shestands easily. No veering to one side is noted. No leaning to one side is noted. Posture is age-appropriate and stance is narrow based. Gait shows stable gait and posture.   Assessment and Plan:  In summary, TAIYA NUTTING a very pleasant 64 year old female ith an underlying medical history of arthritis, chronic constipation, reflux disease, hyperlipidemia, MVP, hypertension, hypothyroidism, history of pneumonia in 2006, hypothyroidism, and obesity, who  presents for follow-up consultation of her obstructive sleep apnea for which she has been on AutoPap therapy for the past several years.  She continues to be fully compliant and continues to benefit from treatment.  She should be eligible for a new machine at this time.  She is also working on weight loss.  She is commended for her treatment adherence.  I will write for a new AutoPap machine.  We will send this  to her DME company, [place].  She is advised to get in touch with [place] if she does not hear back from them in the next week.  She is advised to make a follow-up appointment in 3 months to see one of our nurse practitioners for symptom and compliance recheck after starting new equipment.  After that, we can go back to yearly visits that she has done well.  If need be, if mandated by her insurance, we can consider a home sleep test prior to prescribing you AutoPap equipment.  She would be agreeable to this.  I answered all her questions today and she was in agreement with the plan.   I spent 30 minutes in total face-to-face time and in  reviewing records during pre-charting, more than 50% of which was spent in counseling and coordination of care, reviewing test results, reviewing medications and treatment regimen and/or in discussing or reviewing the diagnosis of OSA, the prognosis and treatment options. Pertinent laboratory and imaging test results that were available during this visit with the patient were reviewed by me and considered in my medical decision making (see chart for details).

## 2020-05-06 NOTE — Patient Instructions (Addendum)
We will set you up at home with a new autoPAP machine at home.  You have continued to do well with your autoPAP and are fully compliant with treatment.  Please keep up the good work.  As discussed, after starting a new machine, even if it is the same model and same pressure settings you have to have a follow-up appointment within 30 to 90 days after starting to use new equipment.  Please schedule a follow-up with one of our nurse practitioners in about 3 months, you can have a virtual visit as well if you like, through [place] video visit.  We can then resume yearly checkup appointments after that.  It is always good to see you! Thank you for your kind words today.

## 2020-05-17 ENCOUNTER — Telehealth: Payer: Self-pay | Admitting: Neurology

## 2020-05-17 NOTE — Telephone Encounter (Signed)
Pt is asking for a call re: whatever update the RN may have on the status of her getting her new CPAP, please call.

## 2020-05-20 NOTE — Telephone Encounter (Signed)
I called the pt and advised I have reached out to [facility] for this order.  Pt was advised to let me know if she has not heard anything within the week. Pt verbalized understanding.

## 2020-05-30 DIAGNOSIS — M25571 Pain in right ankle and joints of right foot: Secondary | ICD-10-CM | POA: Diagnosis not present

## 2020-05-30 DIAGNOSIS — M170 Bilateral primary osteoarthritis of knee: Secondary | ICD-10-CM | POA: Diagnosis not present

## 2020-05-31 ENCOUNTER — Other Ambulatory Visit: Payer: Self-pay | Admitting: Emergency Medicine

## 2020-05-31 ENCOUNTER — Telehealth: Payer: Self-pay | Admitting: Emergency Medicine

## 2020-05-31 DIAGNOSIS — I10 Essential (primary) hypertension: Secondary | ICD-10-CM

## 2020-05-31 NOTE — Telephone Encounter (Signed)
Pt

## 2020-05-31 NOTE — Telephone Encounter (Signed)
Requested Prescriptions  Pending Prescriptions Disp Refills   amLODipine-benazepril (LOTREL) 10-20 MG capsule [Pharmacy Med Name: AMLODIPINE-BENAZEPRIL 10-20 MG] 90 capsule 3    Sig: TAKE 1 CAPSULE BY MOUTH EVERY DAY     Cardiovascular: CCB + ACEI Combos Passed - 05/31/2020 12:24 PM      Passed - Cr in normal range and within 180 days    Creat  Date Value Ref Range Status  06/12/2016 0.69 0.50 - 1.05 mg/dL Final    Comment:      For patients > or = 64 years of age: The upper reference limit for Creatinine is approximately 13% higher for people identified as African-American.      Creatinine, Ser  Date Value Ref Range Status  03/26/2020 0.63 0.57 - 1.00 mg/dL Final   Creatinine, Urine  Date Value Ref Range Status  07/02/2016 98 20 - 320 mg/dL Final         Passed - K in normal range and within 180 days    Potassium  Date Value Ref Range Status  03/26/2020 4.1 3.5 - 5.2 mmol/L Final         Passed - Patient is not pregnant      Passed - Last BP in normal range    BP Readings from Last 1 Encounters:  05/06/20 126/82         Passed - Valid encounter within last 6 months    Recent Outpatient Visits          2 months ago Type 2 diabetes mellitus without complication, with long-term current use of insulin [facility])   Primary Care at [facility], Ines Bloomer, MD   8 months ago Annual physical exam   Primary Care at [place], [place], MD   1 year ago Type 2 diabetes mellitus without complication, with long-term current use of insulin [facility])   Primary Care at [facility], Ines Bloomer, MD   1 year ago Routine general medical examination at a health care facility   Primary Care at [place], [place], MD   2 years ago Type 2 diabetes mellitus without complication, with long-term current use of insulin [facility])   Primary Care at [facility], Laurey Arrow, MD      Future Appointments            In 2 months Lomax, Amy, NP [facility]   In 3  months Sagardia, Ines Bloomer, MD Primary Care at [place], [facility]

## 2020-06-27 DIAGNOSIS — M170 Bilateral primary osteoarthritis of knee: Secondary | ICD-10-CM | POA: Diagnosis not present

## 2020-06-27 DIAGNOSIS — Z6835 Body mass index (BMI) 35.0-35.9, adult: Secondary | ICD-10-CM | POA: Diagnosis not present

## 2020-07-02 ENCOUNTER — Ambulatory Visit
Admission: RE | Admit: 2020-07-02 | Discharge: 2020-07-02 | Disposition: A | Discharge status: Home / Self Care | Payer: Federal, State, Local not specified - PPO | Source: Ambulatory Visit | Attending: Emergency Medicine | Admitting: Emergency Medicine

## 2020-07-02 ENCOUNTER — Other Ambulatory Visit: Payer: Self-pay

## 2020-07-02 DIAGNOSIS — Z1231 Encounter for screening mammogram for malignant neoplasm of breast: Secondary | ICD-10-CM | POA: Diagnosis not present

## 2020-07-10 DIAGNOSIS — M79672 Pain in left foot: Secondary | ICD-10-CM | POA: Diagnosis not present

## 2020-07-10 DIAGNOSIS — M79671 Pain in right foot: Secondary | ICD-10-CM | POA: Diagnosis not present

## 2020-07-10 DIAGNOSIS — M25571 Pain in right ankle and joints of right foot: Secondary | ICD-10-CM | POA: Diagnosis not present

## 2020-07-10 DIAGNOSIS — M25572 Pain in left ankle and joints of left foot: Secondary | ICD-10-CM | POA: Diagnosis not present

## 2020-07-29 ENCOUNTER — Other Ambulatory Visit: Payer: Self-pay | Admitting: Emergency Medicine

## 2020-07-29 DIAGNOSIS — E119 Type 2 diabetes mellitus without complications: Secondary | ICD-10-CM

## 2020-07-29 DIAGNOSIS — E039 Hypothyroidism, unspecified: Secondary | ICD-10-CM

## 2020-07-29 DIAGNOSIS — Z794 Long term (current) use of insulin: Secondary | ICD-10-CM

## 2020-08-02 DIAGNOSIS — Z20828 Contact with and (suspected) exposure to other viral communicable diseases: Secondary | ICD-10-CM | POA: Diagnosis not present

## 2020-08-02 DIAGNOSIS — R69 Illness, unspecified: Secondary | ICD-10-CM | POA: Diagnosis not present

## 2020-08-02 DIAGNOSIS — R070 Pain in throat: Secondary | ICD-10-CM | POA: Diagnosis not present

## 2020-08-07 ENCOUNTER — Telehealth: Payer: Self-pay | Admitting: Emergency Medicine

## 2020-08-07 NOTE — Telephone Encounter (Signed)
Paperwork has been placed in providers box to be completed by front desk. Please let us know when it has been completed.

## 2020-08-07 NOTE — Telephone Encounter (Signed)
Will wait until pt is seen in order for paperwork to be completed.

## 2020-08-07 NOTE — Telephone Encounter (Signed)
She needs office visit for preop clearance.  Thanks.

## 2020-08-07 NOTE — Telephone Encounter (Signed)
Received a fax for surgery clearanse from [facility]. Pt is having surgery on L Knee on 09/11/20. Pt scheduled surgery cleanse appt on 08/29/20. Paperwork will be in providers [employer] in providers lounge. Please advise.

## 2020-08-21 ENCOUNTER — Telehealth (INDEPENDENT_AMBULATORY_CARE_PROVIDER_SITE_OTHER): Payer: Federal, State, Local not specified - PPO | Admitting: Family Medicine

## 2020-08-21 ENCOUNTER — Encounter: Payer: Self-pay | Admitting: Family Medicine

## 2020-08-21 DIAGNOSIS — G4733 Obstructive sleep apnea (adult) (pediatric): Secondary | ICD-10-CM

## 2020-08-21 DIAGNOSIS — Z9989 Dependence on other enabling machines and devices: Secondary | ICD-10-CM

## 2020-08-21 NOTE — Progress Notes (Signed)
  Patient was scheduled for a MyChart visit today to discuss compliance report following receipt of new CPAP machine.  After visit started, it came to my attention that she has not yet received her new CPAP machine.  Due to the national backorder, there have been some delays in getting new CPAP machines out to patients.  Compliance report over the past 30 days shows excellent compliance with appropriate pressure settings.  Residual AHI is 4.3.  I will ask our staff to contact the DME company for her to help facilitate delivery of new CPAP machine.  I will have her contact us once new machine is received to schedule a compliance visit for 31 to 90 days following set up date. Patient aware and in agreement with plan.

## 2020-08-21 NOTE — Patient Instructions (Addendum)
Please continue using your CPAP regularly. While your insurance requires that you use CPAP at least 4 hours each night on 70% of the nights, I recommend, that you not skip any nights and use it throughout the night if you can. Getting used to CPAP and staying with the treatment long term does take time and patience and discipline. Untreated obstructive sleep apnea when it is moderate to severe can have an adverse impact on cardiovascular health and raise her risk for heart disease, arrhythmias, hypertension, congestive heart failure, stroke and diabetes. Untreated obstructive sleep apnea causes sleep disruption, nonrestorative sleep, and sleep deprivation. This can have an impact on your day to day functioning and cause daytime sleepiness and impairment of cognitive function, memory loss, mood disturbance, and problems focussing. Using CPAP regularly can improve these symptoms.   Sleep Apnea Sleep apnea affects breathing during sleep. It causes breathing to stop for a short time or to become shallow. It can also increase the risk of:  Heart attack.  Stroke.  Being very overweight (obese).  Diabetes.  Heart failure.  Irregular heartbeat. The goal of treatment is to help you breathe normally again. What are the causes? There are three kinds of sleep apnea:  Obstructive sleep apnea. This is caused by a blocked or collapsed airway.  Central sleep apnea. This happens when the brain does not send the right signals to the muscles that control breathing.  Mixed sleep apnea. This is a combination of obstructive and central sleep apnea. The most common cause of this condition is a collapsed or blocked airway. This can happen if:  Your throat muscles are too relaxed.  Your tongue and tonsils are too large.  You are overweight.  Your airway is too small. What increases the risk?  Being overweight.  Smoking.  Having a small airway.  Being older.  Being female.  Drinking  alcohol.  Taking medicines to calm yourself (sedatives or tranquilizers).  Having family members with the condition. What are the signs or symptoms?  Trouble staying asleep.  Being sleepy or tired during the day.  Getting angry a lot.  Loud snoring.  Headaches in the morning.  Not being able to focus your mind (concentrate).  Forgetting things.  Less interest in sex.  Mood swings.  Personality changes.  Feelings of sadness (depression).  Waking up a lot during the night to pee (urinate).  Dry mouth.  Sore throat. How is this diagnosed?  Your medical history.  A physical exam.  A test that is done when you are sleeping (sleep study). The test is most often done in a sleep lab but may also be done at home. How is this treated?   Sleeping on your side.  Using a medicine to get rid of mucus in your nose (decongestant).  Avoiding the use of alcohol, medicines to help you relax, or certain pain medicines (narcotics).  Losing weight, if needed.  Changing your diet.  Not smoking.  Using a machine to open your airway while you sleep, such as: ? An oral appliance. This is a mouthpiece that shifts your lower jaw forward. ? A CPAP device. This device blows air through a mask when you breathe out (exhale). ? An EPAP device. This has valves that you put in each nostril. ? A BPAP device. This device blows air through a mask when you breathe in (inhale) and breathe out.  Having surgery if other treatments do not work. It is important to get treatment for sleep apnea.  Without treatment, it can lead to:  High blood pressure.  Coronary artery disease.  In men, not being able to have an erection (impotence).  Reduced thinking ability. Follow these instructions at home: Lifestyle  Make changes that your doctor recommends.  Eat a healthy diet.  Lose weight if needed.  Avoid alcohol, medicines to help you relax, and some pain medicines.  Do not use any  products that contain nicotine or tobacco, such as cigarettes, e-cigarettes, and chewing tobacco. If you need help quitting, ask your doctor. General instructions  Take over-the-counter and prescription medicines only as told by your doctor.  If you were given a machine to use while you sleep, use it only as told by your doctor.  If you are having surgery, make sure to tell your doctor you have sleep apnea. You may need to bring your device with you.  Keep all follow-up visits as told by your doctor. This is important. Contact a doctor if:  The machine that you were given to use during sleep bothers you or does not seem to be working.  You do not get better.  You get worse. Get help right away if:  Your chest hurts.  You have trouble breathing in enough air.  You have an uncomfortable feeling in your back, arms, or stomach.  You have trouble talking.  One side of your body feels weak.  A part of your face is hanging down. These symptoms may be an emergency. Do not wait to see if the symptoms will go away. Get medical help right away. Call your local emergency services (911 in the [place] Do not drive yourself to the hospital. Summary  This condition affects breathing during sleep.  The most common cause is a collapsed or blocked airway.  The goal of treatment is to help you breathe normally while you sleep. This information is not intended to replace advice given to you by your health care provider. Make sure you discuss any questions you have with your health care provider. Document Revised: 05/27/2018 Document Reviewed: 04/05/2018 [employer] Patient Education  2020 [employer].

## 2020-08-27 DIAGNOSIS — M1712 Unilateral primary osteoarthritis, left knee: Secondary | ICD-10-CM | POA: Diagnosis not present

## 2020-08-27 DIAGNOSIS — M25662 Stiffness of left knee, not elsewhere classified: Secondary | ICD-10-CM | POA: Diagnosis not present

## 2020-08-27 DIAGNOSIS — M25562 Pain in left knee: Secondary | ICD-10-CM | POA: Diagnosis not present

## 2020-08-29 ENCOUNTER — Ambulatory Visit: Payer: Federal, State, Local not specified - PPO | Admitting: Emergency Medicine

## 2020-08-29 ENCOUNTER — Encounter: Payer: Self-pay | Admitting: Emergency Medicine

## 2020-08-29 ENCOUNTER — Other Ambulatory Visit: Payer: Self-pay

## 2020-08-29 VITALS — BP 124/75 | HR 67 | Temp 98.7°F | Resp 16 | Ht 62.0 in | Wt 205.6 lb

## 2020-08-29 DIAGNOSIS — Z789 Other specified health status: Secondary | ICD-10-CM | POA: Diagnosis not present

## 2020-08-29 DIAGNOSIS — M1712 Unilateral primary osteoarthritis, left knee: Secondary | ICD-10-CM

## 2020-08-29 DIAGNOSIS — I10 Essential (primary) hypertension: Secondary | ICD-10-CM | POA: Diagnosis not present

## 2020-08-29 DIAGNOSIS — Z6838 Body mass index (BMI) 38.0-38.9, adult: Secondary | ICD-10-CM

## 2020-08-29 DIAGNOSIS — Z01818 Encounter for other preprocedural examination: Secondary | ICD-10-CM | POA: Diagnosis not present

## 2020-08-29 DIAGNOSIS — Z23 Encounter for immunization: Secondary | ICD-10-CM

## 2020-08-29 DIAGNOSIS — I152 Hypertension secondary to endocrine disorders: Secondary | ICD-10-CM

## 2020-08-29 DIAGNOSIS — E1159 Type 2 diabetes mellitus with other circulatory complications: Secondary | ICD-10-CM

## 2020-08-29 DIAGNOSIS — E119 Type 2 diabetes mellitus without complications: Secondary | ICD-10-CM | POA: Diagnosis not present

## 2020-08-29 LAB — POCT URINALYSIS DIP (MANUAL ENTRY)
Bilirubin, UA: NEGATIVE
Glucose, UA: NEGATIVE mg/dL
Ketones, POC UA: NEGATIVE mg/dL
Leukocytes, UA: NEGATIVE
Nitrite, UA: NEGATIVE
Protein Ur, POC: NEGATIVE mg/dL
Spec Grav, UA: 1.015 (ref 1.010–1.025)
Urobilinogen, UA: 0.2 E.U./dL
pH, UA: 5.5 (ref 5.0–8.0)

## 2020-08-29 NOTE — Progress Notes (Addendum)
IRVING LUBBERS 65 y.o.   Chief Complaint  Patient presents with   Pre-op Exam    Left knee orthoplasty    HISTORY OF PRESENT ILLNESS: This is a 65 y.o. female here for preop examination. Scheduled for left total knee arthroplasty on 09/11/2020 by Dr. Paralee Cancel. Patient has a history of diabetes and hypertension.  Under control.  Normal blood pressure readings at home as well as glucose levels. History of sleep apnea on CPAP treatment for many years. No cardiac history.  No history of angina.  Denies chest pain on exertion or dyspnea on exertion. No history of problems with general anesthesia.  No issues with blood transfusions.  No chronic neck problems.  HPI   Prior to Admission medications   Medication Sig Start Date End Date Taking? Authorizing Provider  amLODipine-benazepril (LOTREL) 10-20 MG capsule TAKE 1 CAPSULE BY MOUTH EVERY DAY 05/31/20  Yes Jubal Rademaker, Ines Bloomer, MD  Ascorbic Acid (VITAMIN C) POWD Take 1,000 mg by mouth daily.   Yes [provider]  docusate sodium (COLACE) 100 MG capsule Take 100 mg by mouth 2 (two) times daily as needed.   Yes [provider]  Dulaglutide (TRULICITY) 0.76 KG/8.8PJ SOPN INJECT 0.75 MG INTO THE SKIN ONCE A WEEK 03/26/20  Yes Axell Trigueros, Ines Bloomer, MD  fluticasone [facility]) 50 MCG/ACT nasal spray Place 1 spray into both nostrils daily. 12/29/19  Yes Hall-Potvin, [place], PA-C  glucose blood (ONE TOUCH ULTRA TEST) test strip USE UP TO 1 TIME AS DIRECTED 04/27/19  Yes Mazi Schuff, Ines Bloomer, MD  ibuprofen (ADVIL,MOTRIN) 200 MG tablet Take 200 mg by mouth every 6 (six) hours as needed.   Yes [provider]  levothyroxine (SYNTHROID) 50 MCG tablet TAKE 1 TABLET BY MOUTH EVERY DAY 07/29/20  Yes Shaunette Gassner, Ines Bloomer, MD  meloxicam (MOBIC) 15 MG tablet Take 1 tablet (15 mg total) by mouth daily as needed for pain. 08/05/16  Yes Shawnee Knapp, MD  metFORMIN (GLUCOPHAGE) 1000 MG tablet TAKE 1 TABLET (1,000 MG TOTAL) BY MOUTH 2  (TWO) TIMES DAILY WITH A MEAL. 07/29/20  Yes [place], Ines Bloomer, MD  OneTouch Delica Lancets 03P MISC USE UP TO 1 TIME AS DIRECTED 03/26/20  Yes Sadiya Durand, Ines Bloomer, MD  predniSONE (DELTASONE) 20 MG tablet Take 1 tablet (20 mg total) by mouth daily. 12/29/19  Yes Hall-Potvin, [place], PA-C  aspirin 81 MG tablet Take 81 mg by mouth daily. Patient not taking: Reported on 08/29/2020    [provider]  blood glucose meter kit and supplies KIT Dispense based on patient and insurance preference. Use up to one time as directed  (FOR ICD-9 250.00, 250.01). 03/24/15   Le, [name], DO    Allergies  Allergen Reactions   Amoxicillin     hives hives hives    Patient Active Problem List   Diagnosis Date Noted   Class 2 severe obesity due to excess calories with serious comorbidity and body mass index (BMI) of 38.0 to 38.9 in adult ([place]) 03/26/2020   Statin intolerance 03/26/2020   Vitamin B12 deficiency 02/22/2018   Type 2 diabetes mellitus without complication, with long-term current use of insulin ([place]) 06/22/2017   Acquired hypothyroidism 06/22/2017   OSA (obstructive sleep apnea) 06/28/2013   Obesity (BMI 30-39.9) 07/08/2011   Essential hypertension, benign 10/17/2007   Chronic ischemic heart disease 10/17/2007   Hypertension associated with type 2 diabetes mellitus ([place]) 10/14/2007   Recurrent ventral incisional hernia, periumbilical 59/45/8592   Osteoarthritis 10/14/2007  ABNORMAL GLUCOSE NEC 10/14/2007    Past Medical History:  Diagnosis Date   Abdominal distension    Allergy    per pt   Anxiety    Arthritis    knees    Constipation    Diabetes mellitus without complication (HCC)    GERD (gastroesophageal reflux disease)    Heart murmur    Hyperlipidemia    Hypertension    Hypothyroidism    Incisional hernia    Pneumonia    hx of 2006    Seasonal allergies    Sleep apnea    cpap x 10 yrs Dr [name]    Thyroid disease     Past Surgical History:  Procedure  Laterality Date   [place] and negative   CHOLECYSTECTOMY  2010   lap with open redo umbilical hernia repair.  Dr. [name]   COLONOSCOPY  11/18/2016   HERNIA REPAIR  1610   umbilical open w mesh plug. Dr. 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   Occupation: care taker  Tobacco Use   Smoking status: Former Smoker    Packs/day: 0.10    Years: 4.00    Pack years: 0.40    Types: Cigarettes    Quit date: 07/07/1978    Years since quitting: 42.1   Smokeless tobacco: Never Used   Tobacco comment: occassional smoker with alcohol consumption  Vaping Use   Vaping Use: Never used  Substance and Sexual Activity   Alcohol use: No    Comment: quit 1979   Drug use: No   Sexual activity: Yes    Birth control/protection: None    Comment: number of sex partners in the last 20 months 1  Other Topics Concern   Not on file  Social History Narrative   Lives with husband and his mother is currently living with them after sustaining a fall in Oct. 2013.   Exercise walking 2 times daily   Social Determinants of Health   Financial Resource Strain: Not on file  Food Insecurity: Not on file  Transportation Needs: Not on file  Physical Activity: Not on file  Stress: Not on file  Social Connections: Not on file  Intimate Partner Violence: Not on file    Family History  Problem Relation Age of Onset   Cancer Mother        colon    Heart disease Mother 88       congestive heart failure   COPD Mother    Colon cancer Mother 51   Other Father 77       car accident   Cancer Maternal Grandmother        ovarian   Asthma Son    Allergies Son    Hypertension Paternal Grandmother    Cancer Paternal Grandfather        prostate   Breast cancer Neg  Hx    Colon polyps Neg Hx    Esophageal cancer Neg Hx    Rectal cancer Neg Hx    Stomach cancer Neg Hx      Review of Systems  Constitutional: Negative.  Negative for chills and fever.  HENT: Negative.  Negative for congestion and sore throat.   Respiratory: Negative.  Negative for cough and shortness of breath.   Cardiovascular: Negative.  Negative for chest pain and palpitations.  Gastrointestinal: Negative.  Negative for abdominal pain, diarrhea, nausea and vomiting.  Genitourinary: Negative.  Negative for dysuria and hematuria.  Skin: Negative.  Negative for rash.  Neurological: Negative.  Negative for dizziness and headaches.  All other systems reviewed and are negative.   Today's Vitals   08/29/20 1350  BP: 124/75  Pulse: 67  Resp: 16  Temp: 98.7 F (37.1 C)  TempSrc: Temporal  SpO2: 97%  Weight: 205 lb 9.6 oz (93.3 kg)  Height: '5\' 2"'  (1.575 m)   Body mass index is 37.6 kg/m.  Physical Exam Vitals reviewed.  Constitutional:      Appearance: Normal appearance. She is obese.  HENT:     Head: Normocephalic.  Eyes:     Extraocular Movements: Extraocular movements intact.     Conjunctiva/sclera: Conjunctivae normal.     Pupils: Pupils are equal, round, and reactive to light.  Cardiovascular:     Rate and Rhythm: Normal rate and regular rhythm.     Pulses: Normal pulses.     Heart sounds: Murmur heard.  Systolic murmur is present with a grade of 3/6.  Pulmonary:     Effort: Pulmonary effort is normal.     Breath sounds: Normal breath sounds.  Abdominal:     Palpations: Abdomen is soft.     Tenderness: There is no abdominal tenderness.   Musculoskeletal:     Cervical back: Normal range of motion and neck supple. No tenderness.     Right lower leg: No edema.     Left lower leg: No edema.  Skin:    General: Skin is warm and dry.     Capillary Refill: Capillary refill takes less than 2 seconds.  Neurological:     General: No focal deficit present.     Mental Status: She is alert and oriented to person, place, and time.  Psychiatric:        Mood and Affect: Mood normal.        Behavior: Behavior normal.   EKG: Normal sinus rhythm with ventricular rate of 64/min.  No acute ischemic changes.  No changes compared to EKG done on 07/07/2017. Results for orders placed or performed in visit on 08/29/20 (from the past 72 hour(s))  POCT urinalysis dipstick     Status: Abnormal   Collection Time: 08/29/20  2:19 PM  Result Value Ref Range   Color, UA yellow yellow   Clarity, UA clear clear   Glucose, UA negative negative mg/dL   Bilirubin, UA negative negative   Ketones, POC UA negative negative mg/dL   Spec Grav, UA 1.015 1.010 - 1.025   Blood, UA trace-intact (A) negative   pH, UA 5.5 5.0 - 8.0   Protein Ur, POC negative negative mg/dL   Urobilinogen, UA 0.2 0.2 or 1.0 E.U./dL   Nitrite, UA Negative Negative   Leukocytes, UA Negative Negative  Hemoglobin A1c     Status: Abnormal   Collection Time: 08/29/20  3:56 PM  Result Value Ref Range   Hgb A1c MFr Bld 5.8 (  H) 4.8 - 5.6 %    Comment:          Prediabetes: 5.7 - 6.4          Diabetes: >6.4          Glycemic control for adults with diabetes: <7.0    Est. average glucose Bld gHb Est-mCnc 120 mg/dL  Protime-INR     Status: None   Collection Time: 08/29/20  3:56 PM  Result Value Ref Range   INR 1.0 0.9 - 1.2    Comment: Reference interval is for non-anticoagulated patients. Suggested INR therapeutic range for Vitamin K antagonist therapy:    Standard Dose (moderate intensity                   therapeutic range):       2.0 - 3.0    Higher intensity therapeutic  range       2.5 - 3.5    Prothrombin Time 10.4 9.1 - 12.0 sec  CBC with Differential/Platelet     Status: Abnormal   Collection Time: 08/29/20  3:56 PM  Result Value Ref Range   WBC 7.8 3.4 - 10.8 x10E3/uL   RBC 5.02 3.77 - 5.28 x10E6/uL   Hemoglobin 14.0 11.1 - 15.9 g/dL   Hematocrit 41.1 34.0 - 46.6 %   MCV 82 79 - 97 fL   MCH 27.9 26.6 - 33.0 pg   MCHC 34.1 31.5 - 35.7 g/dL   RDW 13.2 11.7 - 15.4 %   Platelets 304 150 - 450 x10E3/uL   Neutrophils 54 Not Estab. %   Lymphs 33 Not Estab. %   Monocytes 5 Not Estab. %   Eos 7 Not Estab. %   Basos 1 Not Estab. %   Neutrophils Absolute 4.3 1.4 - 7.0 x10E3/uL   Lymphocytes Absolute 2.5 0.7 - 3.1 x10E3/uL   Monocytes Absolute 0.4 0.1 - 0.9 x10E3/uL   EOS (ABSOLUTE) 0.5 (H) 0.0 - 0.4 x10E3/uL   Basophils Absolute 0.1 0.0 - 0.2 x10E3/uL   Immature Granulocytes 0 Not Estab. %   Immature Grans (Abs) 0.0 0.0 - 0.1 x10E3/uL  Comprehensive metabolic panel     Status: None   Collection Time: 08/29/20  3:56 PM  Result Value Ref Range   Glucose 88 65 - 99 mg/dL   BUN 14 8 - 27 mg/dL   Creatinine, Ser 0.61 0.57 - 1.00 mg/dL   GFR calc non Af Amer 96 >59 mL/min/1.73   GFR calc Af Amer 111 >59 mL/min/1.73    Comment: **In accordance with recommendations from the NKF-ASN Task force,**   [facility] is in the process of updating its eGFR calculation to the   2021 CKD-EPI creatinine equation that estimates kidney function   without a race variable.    BUN/Creatinine Ratio 23 12 - 28   Sodium 139 134 - 144 mmol/L   Potassium 4.6 3.5 - 5.2 mmol/L   Chloride 103 96 - 106 mmol/L   CO2 21 20 - 29 mmol/L   Calcium 9.8 8.7 - 10.3 mg/dL   Total Protein 7.2 6.0 - 8.5 g/dL   Albumin 4.3 3.8 - 4.8 g/dL   Globulin, Total 2.9 1.5 - 4.5 g/dL   Albumin/Globulin Ratio 1.5 1.2 - 2.2   Bilirubin Total 0.3 0.0 - 1.2 mg/dL   Alkaline Phosphatase 96 44 - 121 IU/L    Comment:               **Please note reference interval change**  AST 25 0 - 40 IU/L   ALT 29  0 - 32 IU/L    ASSESSMENT & PLAN: Moderate cardiac risk due to age, diabetes, hypertension, and obesity. Clear for surgery. Nanna was seen today for pre-op exam.  Diagnoses and all orders for this visit:  Preop examination -     Hemoglobin A1c -     Protime-INR -     CBC with Differential/Platelet -     Comprehensive metabolic panel -     POCT urinalysis dipstick  Essential hypertension -     EKG 12-Lead  Statin intolerance  Type 2 diabetes mellitus without complication, without long-term current use of insulin ([place])  Hypertension associated with type 2 diabetes mellitus (HCC)  Class 2 severe obesity due to excess calories with serious comorbidity and body mass index (BMI) of 38.0 to 38.9 in adult [facility])  Need for prophylactic vaccination and inoculation against influenza -     Flu Vaccine QUAD 36+ mos IM  Osteoarthritis of left knee, unspecified osteoarthritis type    Patient Instructions       If you have lab work done today you will be contacted with your lab results within the next 2 weeks.  If you have not heard from [place] then please contact us. The fastest way to get your results is to register for My Chart.   IF you received an x-ray today, you will receive an invoice from [facility]. Please contact [facility] at [phone] with questions or concerns regarding your invoice.   IF you received labwork today, you will receive an invoice from [place]. Please contact [facility] at [phone] with questions or concerns regarding your invoice.   Our billing staff will not be able to assist you with questions regarding bills from these companies.  You will be contacted with the lab results as soon as they are available. The fastest way to get your results is to activate your My Chart account. Instructions are located on the last page of this paperwork. If you have not heard from [place] regarding the results in 2 weeks, please contact this office.      Health Maintenance, Female Adopting a healthy lifestyle and getting preventive care are important in promoting health and wellness. Ask your health care provider about: The right schedule for you to have regular tests and exams. Things you can do on your own to prevent diseases and keep yourself healthy. What should I know about diet, weight, and exercise? Eat a healthy diet  Eat a diet that includes plenty of vegetables, fruits, low-fat dairy products, and lean protein. Do not eat a lot of foods that are high in solid fats, added sugars, or sodium. Maintain a healthy weight Body mass index (BMI) is used to identify weight problems. It estimates body fat based on height and weight. Your health care provider can help determine your BMI and help you achieve or maintain a healthy weight. Get regular exercise Get regular exercise. This is one of the most important things you can do for your health. Most adults should: Exercise for at least 150 minutes each week. The exercise should increase your heart rate and make you sweat (moderate-intensity exercise). Do strengthening exercises at least twice a week. This is in addition to the moderate-intensity exercise. Spend less time sitting. Even light physical activity can be beneficial. Watch cholesterol and blood lipids Have your blood tested for lipids and cholesterol at 65 years of age, then have this test every 5 years.  Have your cholesterol levels checked more often if: Your lipid or cholesterol levels are high. You are older than 65 years of age. You are at high risk for heart disease. What should I know about cancer screening? Depending on your health history and family history, you may need to have cancer screening at various ages. This may include screening for: Breast cancer. Cervical cancer. Colorectal cancer. Skin cancer. Lung cancer. What should I know about heart disease, diabetes, and high blood pressure? Blood pressure and  heart disease High blood pressure causes heart disease and increases the risk of stroke. This is more likely to develop in people who have high blood pressure readings, are of African descent, or are overweight. Have your blood pressure checked: Every 3-5 years if you are 104-83 years of age. Every year if you are 79 years old or older. Diabetes Have regular diabetes screenings. This checks your fasting blood sugar level. Have the screening done: Once every three years after age 40 if you are at a normal weight and have a low risk for diabetes. More often and at a younger age if you are overweight or have a high risk for diabetes. What should I know about preventing infection? Hepatitis B If you have a higher risk for hepatitis B, you should be screened for this virus. Talk with your health care provider to find out if you are at risk for hepatitis B infection. Hepatitis C Testing is recommended for: Everyone born from [birth year] through 1965. Anyone with known risk factors for hepatitis C. Sexually transmitted infections (STIs) Get screened for STIs, including gonorrhea and chlamydia, if: You are sexually active and are younger than 65 years of age. You are older than 64 years of age and your health care provider tells you that you are at risk for this type of infection. Your sexual activity has changed since you were last screened, and you are at increased risk for chlamydia or gonorrhea. Ask your health care provider if you are at risk. Ask your health care provider about whether you are at high risk for HIV. Your health care provider may recommend a prescription medicine to help prevent HIV infection. If you choose to take medicine to prevent HIV, you should first get tested for HIV. You should then be tested every 3 months for as long as you are taking the medicine. Pregnancy If you are about to stop having your period (premenopausal) and you may become pregnant, seek counseling before you get  pregnant. Take 400 to 800 micrograms (mcg) of folic acid every day if you become pregnant. Ask for birth control (contraception) if you want to prevent pregnancy. Osteoporosis and menopause Osteoporosis is a disease in which the bones lose minerals and strength with aging. This can result in bone fractures. If you are 59 years old or older, or if you are at risk for osteoporosis and fractures, ask your health care provider if you should: Be screened for bone loss. Take a calcium or vitamin D supplement to lower your risk of fractures. Be given hormone replacement therapy (HRT) to treat symptoms of menopause. Follow these instructions at home: Lifestyle Do not use any products that contain nicotine or tobacco, such as cigarettes, e-cigarettes, and chewing tobacco. If you need help quitting, ask your health care provider. Do not use street drugs. Do not share needles. Ask your health care provider for help if you need support or information about quitting drugs. Alcohol use Do not drink alcohol if: Your health  care provider tells you not to drink. You are pregnant, may be pregnant, or are planning to become pregnant. If you drink alcohol: Limit how much you use to 0-1 drink a day. Limit intake if you are breastfeeding. Be aware of how much alcohol is in your drink. In the [place] one drink equals one 12 oz bottle of beer (355 mL), one 5 oz glass of wine (148 mL), or one 1 oz glass of hard liquor (44 mL). General instructions Schedule regular health, dental, and eye exams. Stay current with your vaccines. Tell your health care provider if: You often feel depressed. You have ever been abused or do not feel safe at home. Summary Adopting a healthy lifestyle and getting preventive care are important in promoting health and wellness. Follow your health care provider's instructions about healthy diet, exercising, and getting tested or screened for diseases. Follow your health care provider's  instructions on monitoring your cholesterol and blood pressure. This information is not intended to replace advice given to you by your health care provider. Make sure you discuss any questions you have with your health care provider. Document Revised: 08/03/2018 Document Reviewed: 08/03/2018 Elsevier Patient Education  2020 [employer]      Agustina Caroli, MD [facility]

## 2020-08-29 NOTE — Patient Instructions (Addendum)
   If you have lab work done today you will be contacted with your lab results within the next 2 weeks.  If you have not heard from us then please contact us. The fastest way to get your results is to register for My Chart.   IF you received an x-ray today, you will receive an invoice from [facility]. Please contact [facility] at [phone] with questions or concerns regarding your invoice.   IF you received labwork today, you will receive an invoice from [facility]. Please contact [facility] at [phone] with questions or concerns regarding your invoice.   Our billing staff will not be able to assist you with questions regarding bills from these companies.  You will be contacted with the lab results as soon as they are available. The fastest way to get your results is to activate your My Chart account. Instructions are located on the last page of this paperwork. If you have not heard from us regarding the results in 2 weeks, please contact this office.       Health Maintenance, Female Adopting a healthy lifestyle and getting preventive care are important in promoting health and wellness. Ask your health care provider about:  The right schedule for you to have regular tests and exams.  Things you can do on your own to prevent diseases and keep yourself healthy. What should I know about diet, weight, and exercise? Eat a healthy diet   Eat a diet that includes plenty of vegetables, fruits, low-fat dairy products, and lean protein.  Do not eat a lot of foods that are high in solid fats, added sugars, or sodium. Maintain a healthy weight Body mass index (BMI) is used to identify weight problems. It estimates body fat based on height and weight. Your health care provider can help determine your BMI and help you achieve or maintain a healthy weight. Get regular exercise Get regular exercise. This is one of the most important things you can do for your health. Most  adults should:  Exercise for at least 150 minutes each week. The exercise should increase your heart rate and make you sweat (moderate-intensity exercise).  Do strengthening exercises at least twice a week. This is in addition to the moderate-intensity exercise.  Spend less time sitting. Even light physical activity can be beneficial. Watch cholesterol and blood lipids Have your blood tested for lipids and cholesterol at 65 years of age, then have this test every 5 years. Have your cholesterol levels checked more often if:  Your lipid or cholesterol levels are high.  You are older than 65 years of age.  You are at high risk for heart disease. What should I know about cancer screening? Depending on your health history and family history, you may need to have cancer screening at various ages. This may include screening for:  Breast cancer.  Cervical cancer.  Colorectal cancer.  Skin cancer.  Lung cancer. What should I know about heart disease, diabetes, and high blood pressure? Blood pressure and heart disease  High blood pressure causes heart disease and increases the risk of stroke. This is more likely to develop in people who have high blood pressure readings, are of African descent, or are overweight.  Have your blood pressure checked: ? Every 3-5 years if you are 18-39 years of age. ? Every year if you are 40 years old or older. Diabetes Have regular diabetes screenings. This checks your fasting blood sugar level. Have the screening done:  Once   every three years after age 40 if you are at a normal weight and have a low risk for diabetes.  More often and at a younger age if you are overweight or have a high risk for diabetes. What should I know about preventing infection? Hepatitis B If you have a higher risk for hepatitis B, you should be screened for this virus. Talk with your health care provider to find out if you are at risk for hepatitis B infection. Hepatitis  C Testing is recommended for:  Everyone born from 1945 through 1965.  Anyone with known risk factors for hepatitis C. Sexually transmitted infections (STIs)  Get screened for STIs, including gonorrhea and chlamydia, if: ? You are sexually active and are younger than 65 years of age. ? You are older than 65 years of age and your health care provider tells you that you are at risk for this type of infection. ? Your sexual activity has changed since you were last screened, and you are at increased risk for chlamydia or gonorrhea. Ask your health care provider if you are at risk.  Ask your health care provider about whether you are at high risk for HIV. Your health care provider may recommend a prescription medicine to help prevent HIV infection. If you choose to take medicine to prevent HIV, you should first get tested for HIV. You should then be tested every 3 months for as long as you are taking the medicine. Pregnancy  If you are about to stop having your period (premenopausal) and you may become pregnant, seek counseling before you get pregnant.  Take 400 to 800 micrograms (mcg) of folic acid every day if you become pregnant.  Ask for birth control (contraception) if you want to prevent pregnancy. Osteoporosis and menopause Osteoporosis is a disease in which the bones lose minerals and strength with aging. This can result in bone fractures. If you are 65 years old or older, or if you are at risk for osteoporosis and fractures, ask your health care provider if you should:  Be screened for bone loss.  Take a calcium or vitamin D supplement to lower your risk of fractures.  Be given hormone replacement therapy (HRT) to treat symptoms of menopause. Follow these instructions at home: Lifestyle  Do not use any products that contain nicotine or tobacco, such as cigarettes, e-cigarettes, and chewing tobacco. If you need help quitting, ask your health care provider.  Do not use street  drugs.  Do not share needles.  Ask your health care provider for help if you need support or information about quitting drugs. Alcohol use  Do not drink alcohol if: ? Your health care provider tells you not to drink. ? You are pregnant, may be pregnant, or are planning to become pregnant.  If you drink alcohol: ? Limit how much you use to 0-1 drink a day. ? Limit intake if you are breastfeeding.  Be aware of how much alcohol is in your drink. In the [place] one drink equals one 12 oz bottle of beer (355 mL), one 5 oz glass of wine (148 mL), or one 1 oz glass of hard liquor (44 mL). General instructions  Schedule regular health, dental, and eye exams.  Stay current with your vaccines.  Tell your health care provider if: ? You often feel depressed. ? You have ever been abused or do not feel safe at home. Summary  Adopting a healthy lifestyle and getting preventive care are important in promoting health and   wellness.  Follow your health care provider's instructions about healthy diet, exercising, and getting tested or screened for diseases.  Follow your health care provider's instructions on monitoring your cholesterol and blood pressure. This information is not intended to replace advice given to you by your health care provider. Make sure you discuss any questions you have with your health care provider. Document Revised: 08/03/2018 Document Reviewed: 08/03/2018 Elsevier Patient Education  2020 Elsevier Inc.  

## 2020-08-30 LAB — PROTIME-INR
INR: 1 (ref 0.9–1.2)
Prothrombin Time: 10.4 s (ref 9.1–12.0)

## 2020-08-30 LAB — HEMOGLOBIN A1C
Est. average glucose Bld gHb Est-mCnc: 120 mg/dL
Hgb A1c MFr Bld: 5.8 % — ABNORMAL HIGH (ref 4.8–5.6)

## 2020-08-30 LAB — COMPREHENSIVE METABOLIC PANEL
ALT: 29 IU/L (ref 0–32)
AST: 25 IU/L (ref 0–40)
Albumin/Globulin Ratio: 1.5 (ref 1.2–2.2)
Albumin: 4.3 g/dL (ref 3.8–4.8)
Alkaline Phosphatase: 96 IU/L (ref 44–121)
BUN/Creatinine Ratio: 23 (ref 12–28)
BUN: 14 mg/dL (ref 8–27)
Bilirubin Total: 0.3 mg/dL (ref 0.0–1.2)
CO2: 21 mmol/L (ref 20–29)
Calcium: 9.8 mg/dL (ref 8.7–10.3)
Chloride: 103 mmol/L (ref 96–106)
Creatinine, Ser: 0.61 mg/dL (ref 0.57–1.00)
GFR calc Af Amer: 111 mL/min/{1.73_m2} (ref >59)
GFR calc non Af Amer: 96 mL/min/{1.73_m2} (ref >59)
Globulin, Total: 2.9 g/dL (ref 1.5–4.5)
Glucose: 88 mg/dL (ref 65–99)
Potassium: 4.6 mmol/L (ref 3.5–5.2)
Sodium: 139 mmol/L (ref 134–144)
Total Protein: 7.2 g/dL (ref 6.0–8.5)

## 2020-08-30 LAB — CBC WITH DIFFERENTIAL/PLATELET
Basophils Absolute: 0.1 10*3/uL (ref 0.0–0.2)
Basos: 1 %
EOS (ABSOLUTE): 0.5 10*3/uL — ABNORMAL HIGH (ref 0.0–0.4)
Eos: 7 %
Hematocrit: 41.1 % (ref 34.0–46.6)
Hemoglobin: 14 g/dL (ref 11.1–15.9)
Immature Grans (Abs): 0 10*3/uL (ref 0.0–0.1)
Immature Granulocytes: 0 %
Lymphocytes Absolute: 2.5 10*3/uL (ref 0.7–3.1)
Lymphs: 33 %
MCH: 27.9 pg (ref 26.6–33.0)
MCHC: 34.1 g/dL (ref 31.5–35.7)
MCV: 82 fL (ref 79–97)
Monocytes Absolute: 0.4 10*3/uL (ref 0.1–0.9)
Monocytes: 5 %
Neutrophils Absolute: 4.3 10*3/uL (ref 1.4–7.0)
Neutrophils: 54 %
Platelets: 304 10*3/uL (ref 150–450)
RBC: 5.02 x10E6/uL (ref 3.77–5.28)
RDW: 13.2 % (ref 11.7–15.4)
WBC: 7.8 10*3/uL (ref 3.4–10.8)

## 2020-09-03 DIAGNOSIS — M25371 Other instability, right ankle: Secondary | ICD-10-CM | POA: Diagnosis not present

## 2020-09-03 DIAGNOSIS — Q6651 Congenital pes planus, right foot: Secondary | ICD-10-CM | POA: Diagnosis not present

## 2020-09-03 DIAGNOSIS — M24571 Contracture, right ankle: Secondary | ICD-10-CM | POA: Diagnosis not present

## 2020-09-04 ENCOUNTER — Telehealth: Payer: Self-pay | Admitting: *Deleted

## 2020-09-04 DIAGNOSIS — Z1152 Encounter for screening for COVID-19: Secondary | ICD-10-CM | POA: Diagnosis not present

## 2020-09-04 NOTE — Telephone Encounter (Signed)
Faxed pre-op form, EKG, lab results and office notes to [facility] Attn: Orson Slick. Confirmation at 12:01p.

## 2020-09-10 DIAGNOSIS — Z1152 Encounter for screening for COVID-19: Secondary | ICD-10-CM | POA: Diagnosis not present

## 2020-09-26 ENCOUNTER — Ambulatory Visit: Payer: Federal, State, Local not specified - PPO | Admitting: Emergency Medicine

## 2020-09-30 DIAGNOSIS — M1712 Unilateral primary osteoarthritis, left knee: Secondary | ICD-10-CM | POA: Diagnosis not present

## 2020-09-30 DIAGNOSIS — M25562 Pain in left knee: Secondary | ICD-10-CM | POA: Diagnosis not present

## 2020-09-30 DIAGNOSIS — G8918 Other acute postprocedural pain: Secondary | ICD-10-CM | POA: Diagnosis not present

## 2020-10-03 ENCOUNTER — Telehealth: Payer: Self-pay | Admitting: Neurology

## 2020-10-03 DIAGNOSIS — M25562 Pain in left knee: Secondary | ICD-10-CM | POA: Diagnosis not present

## 2020-10-03 NOTE — Telephone Encounter (Signed)
Late entry post- patient had her visit in Dec with the NP and we did reach out to the DME company to verify if they were still working on getting her set up with the new machine. Pt can continue to check in on the status with [facility] [phone])  Reply from [facility] on 08/21/2020 "Pt is a re-PAP with an AHI of 12, so unfortunately she will probably end up waiting a while for a new machine as long as hers is functioning properly.   We did try to reach her several times over the last couple of months, but her voicemail is not set up and she has not responded to texts or emails.

## 2020-10-07 DIAGNOSIS — M25562 Pain in left knee: Secondary | ICD-10-CM | POA: Diagnosis not present

## 2020-10-09 DIAGNOSIS — M25562 Pain in left knee: Secondary | ICD-10-CM | POA: Diagnosis not present

## 2020-10-11 DIAGNOSIS — M25562 Pain in left knee: Secondary | ICD-10-CM | POA: Diagnosis not present

## 2020-10-14 ENCOUNTER — Other Ambulatory Visit: Payer: Self-pay | Admitting: Emergency Medicine

## 2020-10-14 DIAGNOSIS — M25562 Pain in left knee: Secondary | ICD-10-CM | POA: Diagnosis not present

## 2020-10-14 DIAGNOSIS — E119 Type 2 diabetes mellitus without complications: Secondary | ICD-10-CM

## 2020-10-16 DIAGNOSIS — M25562 Pain in left knee: Secondary | ICD-10-CM | POA: Diagnosis not present

## 2020-10-17 ENCOUNTER — Ambulatory Visit: Payer: Federal, State, Local not specified - PPO | Admitting: Emergency Medicine

## 2020-10-18 DIAGNOSIS — M25562 Pain in left knee: Secondary | ICD-10-CM | POA: Diagnosis not present

## 2020-10-22 DIAGNOSIS — M25562 Pain in left knee: Secondary | ICD-10-CM | POA: Diagnosis not present

## 2020-10-25 DIAGNOSIS — M25562 Pain in left knee: Secondary | ICD-10-CM | POA: Diagnosis not present

## 2020-10-28 ENCOUNTER — Telehealth: Payer: Self-pay | Admitting: Family Medicine

## 2020-10-28 NOTE — Telephone Encounter (Signed)
Pt. is requesting a call from RN regarding settings on CPAP machine. Please advise.

## 2020-10-29 DIAGNOSIS — M25562 Pain in left knee: Secondary | ICD-10-CM | POA: Diagnosis not present

## 2020-10-29 NOTE — Telephone Encounter (Signed)
Called pt and she has appt Thursday at 1100 with [facility] to look ather machine, blowing a lot dry air? Needing adjustment.  ? Leak, hard time laying due to her knee, has lost weight, ? Need another sleep study.  Will check humidity. She has had machine since 2014.  Has been on list for about a yr for new machine.  She will call and let us know if still needs some adjustment from [place] after her appt Thursday with [facility].

## 2020-11-01 DIAGNOSIS — M25562 Pain in left knee: Secondary | ICD-10-CM | POA: Diagnosis not present

## 2020-11-05 ENCOUNTER — Encounter: Payer: Self-pay | Admitting: Emergency Medicine

## 2020-11-05 ENCOUNTER — Ambulatory Visit: Payer: Federal, State, Local not specified - PPO | Admitting: Emergency Medicine

## 2020-11-05 ENCOUNTER — Other Ambulatory Visit: Payer: Self-pay

## 2020-11-05 VITALS — BP 119/77 | HR 78 | Temp 99.2°F | Resp 16 | Ht 62.0 in | Wt 205.0 lb

## 2020-11-05 DIAGNOSIS — E1159 Type 2 diabetes mellitus with other circulatory complications: Secondary | ICD-10-CM | POA: Diagnosis not present

## 2020-11-05 DIAGNOSIS — I152 Hypertension secondary to endocrine disorders: Secondary | ICD-10-CM | POA: Diagnosis not present

## 2020-11-05 DIAGNOSIS — G720 Drug-induced myopathy: Secondary | ICD-10-CM

## 2020-11-05 DIAGNOSIS — E119 Type 2 diabetes mellitus without complications: Secondary | ICD-10-CM

## 2020-11-05 DIAGNOSIS — Z96652 Presence of left artificial knee joint: Secondary | ICD-10-CM | POA: Diagnosis not present

## 2020-11-05 DIAGNOSIS — T466X5A Adverse effect of antihyperlipidemic and antiarteriosclerotic drugs, initial encounter: Secondary | ICD-10-CM

## 2020-11-05 DIAGNOSIS — Z794 Long term (current) use of insulin: Secondary | ICD-10-CM

## 2020-11-05 LAB — POCT GLYCOSYLATED HEMOGLOBIN (HGB A1C): Hemoglobin A1C: 5.5 % (ref 4.0–5.6)

## 2020-11-05 LAB — GLUCOSE, POCT (MANUAL RESULT ENTRY): POC Glucose: 97 mg/dl (ref 70–99)

## 2020-11-05 MED ORDER — METFORMIN HCL 1000 MG PO TABS: 1000.0000 mg | ORAL_TABLET | Freq: Two times a day (BID) | ORAL | 3 refills | Status: DC

## 2020-11-05 MED ORDER — FLUTICASONE PROPIONATE 50 MCG/ACT NA SUSP
1.0000 | Freq: Every day | NASAL | 2 refills | Status: DC
Start: 2020-11-05 — End: 2021-09-26

## 2020-11-05 NOTE — Patient Instructions (Addendum)
   If you have lab work done today you will be contacted with your lab results within the next 2 weeks.  If you have not heard from us then please contact us. The fastest way to get your results is to register for My Chart.   IF you received an x-ray today, you will receive an invoice from [facility]. Please contact [facility] at [phone] with questions or concerns regarding your invoice.   IF you received labwork today, you will receive an invoice from [facility]. Please contact [facility] at [phone] with questions or concerns regarding your invoice.   Our billing staff will not be able to assist you with questions regarding bills from these companies.  You will be contacted with the lab results as soon as they are available. The fastest way to get your results is to activate your My Chart account. Instructions are located on the last page of this paperwork. If you have not heard from us regarding the results in 2 weeks, please contact this office.     Diabetes Mellitus and Nutrition, Adult When you have diabetes, or diabetes mellitus, it is very important to have healthy eating habits because your blood sugar (glucose) levels are greatly affected by what you eat and drink. Eating healthy foods in the right amounts, at about the same times every day, can help you:  Control your blood glucose.  Lower your risk of heart disease.  Improve your blood pressure.  Reach or maintain a healthy weight. What can affect my meal plan? Every person with diabetes is different, and each person has different needs for a meal plan. Your health care provider may recommend that you work with a dietitian to make a meal plan that is best for you. Your meal plan may vary depending on factors such as:  The calories you need.  The medicines you take.  Your weight.  Your blood glucose, blood pressure, and cholesterol levels.  Your activity level.  Other health conditions you  have, such as heart or kidney disease. How do carbohydrates affect me? Carbohydrates, also called carbs, affect your blood glucose level more than any other type of food. Eating carbs naturally raises the amount of glucose in your blood. Carb counting is a method for keeping track of how many carbs you eat. Counting carbs is important to keep your blood glucose at a healthy level, especially if you use insulin or take certain oral diabetes medicines. It is important to know how many carbs you can safely have in each meal. This is different for every person. Your dietitian can help you calculate how many carbs you should have at each meal and for each snack. How does alcohol affect me? Alcohol can cause a sudden decrease in blood glucose (hypoglycemia), especially if you use insulin or take certain oral diabetes medicines. Hypoglycemia can be a life-threatening condition. Symptoms of hypoglycemia, such as sleepiness, dizziness, and confusion, are similar to symptoms of having too much alcohol.  Do not drink alcohol if: ? Your health care provider tells you not to drink. ? You are pregnant, may be pregnant, or are planning to become pregnant.  If you drink alcohol: ? Do not drink on an empty stomach. ? Limit how much you use to:  0-1 drink a day for women.  0-2 drinks a day for men. ? Be aware of how much alcohol is in your drink. In the [place] one drink equals one 12 oz bottle of beer (355 mL),   one 5 oz glass of wine (148 mL), or one 1 oz glass of hard liquor (44 mL). ? Keep yourself hydrated with water, diet soda, or unsweetened iced tea.  Keep in mind that regular soda, juice, and other mixers may contain a lot of sugar and must be counted as carbs. What are tips for following this plan? Reading food labels  Start by checking the serving size on the "Nutrition Facts" label of packaged foods and drinks. The amount of calories, carbs, fats, and other nutrients listed on the label is based on  one serving of the item. Many items contain more than one serving per package.  Check the total grams (g) of carbs in one serving. You can calculate the number of servings of carbs in one serving by dividing the total carbs by 15. For example, if a food has 30 g of total carbs per serving, it would be equal to 2 servings of carbs.  Check the number of grams (g) of saturated fats and trans fats in one serving. Choose foods that have a low amount or none of these fats.  Check the number of milligrams (mg) of salt (sodium) in one serving. Most people should limit total sodium intake to less than 2,300 mg per day.  Always check the nutrition information of foods labeled as "low-fat" or "nonfat." These foods may be higher in added sugar or refined carbs and should be avoided.  Talk to your dietitian to identify your daily goals for nutrients listed on the label. Shopping  Avoid buying canned, pre-made, or processed foods. These foods tend to be high in fat, sodium, and added sugar.  Shop around the outside edge of the grocery store. This is where you will most often find fresh fruits and vegetables, bulk grains, fresh meats, and fresh dairy. Cooking  Use low-heat cooking methods, such as baking, instead of high-heat cooking methods like deep frying.  Cook using healthy oils, such as olive, canola, or sunflower oil.  Avoid cooking with butter, cream, or high-fat meats. Meal planning  Eat meals and snacks regularly, preferably at the same times every day. Avoid going long periods of time without eating.  Eat foods that are high in fiber, such as fresh fruits, vegetables, beans, and whole grains. Talk with your dietitian about how many servings of carbs you can eat at each meal.  Eat 4-6 oz (112-168 g) of lean protein each day, such as lean meat, chicken, fish, eggs, or tofu. One ounce (oz) of lean protein is equal to: ? 1 oz (28 g) of meat, chicken, or fish. ? 1 egg. ?  cup (62 g) of  tofu.  Eat some foods each day that contain healthy fats, such as avocado, nuts, seeds, and fish.   What foods should I eat? Fruits Berries. Apples. Oranges. Peaches. Apricots. Plums. Grapes. Mango. Papaya. Pomegranate. Kiwi. Cherries. Vegetables Lettuce. Spinach. Leafy greens, including kale, chard, collard greens, and mustard greens. Beets. Cauliflower. Cabbage. Broccoli. Carrots. Green beans. Tomatoes. Peppers. Onions. Cucumbers. Brussels sprouts. Grains Whole grains, such as whole-wheat or whole-grain bread, crackers, tortillas, cereal, and pasta. Unsweetened oatmeal. Quinoa. Brown or wild rice. Meats and other proteins Seafood. Poultry without skin. Lean cuts of poultry and beef. Tofu. Nuts. Seeds. Dairy Low-fat or fat-free dairy products such as milk, yogurt, and cheese. The items listed above may not be a complete list of foods and beverages you can eat. Contact a dietitian for more information. What foods should I avoid? Fruits Fruits canned   with syrup. Vegetables Canned vegetables. Frozen vegetables with butter or cream sauce. Grains Refined white flour and flour products such as bread, pasta, snack foods, and cereals. Avoid all processed foods. Meats and other proteins Fatty cuts of meat. Poultry with skin. Breaded or fried meats. Processed meat. Avoid saturated fats. Dairy Full-fat yogurt, cheese, or milk. Beverages Sweetened drinks, such as soda or iced tea. The items listed above may not be a complete list of foods and beverages you should avoid. Contact a dietitian for more information. Questions to ask a health care provider  Do I need to meet with a diabetes educator?  Do I need to meet with a dietitian?  What number can I call if I have questions?  When are the best times to check my blood glucose? Where to find more information:  American Diabetes Association: diabetes.org  Academy of Nutrition and Dietetics: www.eatright.org  National Institute of  Diabetes and Digestive and Kidney Diseases: www.niddk.nih.gov  Association of Diabetes Care and Education Specialists: www.diabeteseducator.org Summary  It is important to have healthy eating habits because your blood sugar (glucose) levels are greatly affected by what you eat and drink.  A healthy meal plan will help you control your blood glucose and maintain a healthy lifestyle.  Your health care provider may recommend that you work with a dietitian to make a meal plan that is best for you.  Keep in mind that carbohydrates (carbs) and alcohol have immediate effects on your blood glucose levels. It is important to count carbs and to use alcohol carefully. This information is not intended to replace advice given to you by your health care provider. Make sure you discuss any questions you have with your health care provider. Document Revised: 07/18/2019 Document Reviewed: 07/18/2019 Elsevier Patient Education  2021 Elsevier Inc.  

## 2020-11-05 NOTE — Progress Notes (Signed)
Katherine Sanchez 65 y.o.   Chief Complaint  Patient presents with  . Diabetes    Follow up 6 month  . Hypertension    HISTORY OF PRESENT ILLNESS: This is a 65 y.o. female with history of diabetes and hypertension here for 60-monthfollow-up. Doing well.  Normal blood pressure readings at home.  Eating much better.  Diabetes under control. Status post left knee replacement last month.  Recovering well.  No complications. Has no complaints or medical concerns today.  HPI   Prior to Admission medications   Medication Sig Start Date End Date Taking? Authorizing Provider  amLODipine-benazepril (LOTREL) 10-20 MG capsule TAKE 1 CAPSULE BY MOUTH EVERY DAY 05/31/20  Yes Alicya Bena, MInes Bloomer MD  Ascorbic Acid (VITAMIN C) POWD Take 1,000 mg by mouth daily.   Yes [provider]  aspirin 81 MG tablet Take 81 mg by mouth daily.   Yes [provider]  docusate sodium (COLACE) 100 MG capsule Take 100 mg by mouth 2 (two) times daily as needed.   Yes [provider]  Dulaglutide (TRULICITY) 00.97MDZ/3.2DJSOPN INJECT 0.75 MG INTO THE SKIN ONCE A WEEK 03/26/20  Yes Caterine Mcmeans, MInes Bloomer MD  ibuprofen (ADVIL,MOTRIN) 200 MG tablet Take 200 mg by mouth every 6 (six) hours as needed.   Yes [provider]  levothyroxine (SYNTHROID) 50 MCG tablet TAKE 1 TABLET BY MOUTH EVERY DAY 07/29/20  Yes Armella Stogner, MInes Bloomer MD  meloxicam (MOBIC) 15 MG tablet Take 1 tablet (15 mg total) by mouth daily as needed for pain. 08/05/16  Yes SShawnee Knapp MD  blood glucose meter kit and supplies KIT Dispense based on patient and insurance preference. Use up to one time as directed  (FOR ICD-9 250.00, 250.01). 03/24/15   Le, [name], DO  fluticasone (FLONASE) 50 MCG/ACT nasal spray Place 1 spray into both nostrils daily. 11/05/20   SHorald Pollen MD  glucose blood (ONE TOUCH ULTRA TEST) test strip USE UP TO 1 TIME AS DIRECTED 04/27/19   SHorald Pollen MD  metFORMIN (GLUCOPHAGE)  1000 MG tablet Take 1 tablet (1,000 mg total) by mouth 2 (two) times daily with a meal. 11/05/20   [place] MInes Bloomer MD  OneTouch Delica Lancets 324QMISC USE UP TO 1 TIME AS DIRECTED 03/26/20   SHorald Pollen MD  predniSONE (DELTASONE) 20 MG tablet Take 1 tablet (20 mg total) by mouth daily. Patient not taking: Reported on 11/05/2020 12/29/19   Hall-Potvin, [place] PA-C    Allergies  Allergen Reactions  . Amoxicillin     hives hives hives    Patient Active Problem List   Diagnosis Date Noted  . Class 2 severe obesity due to excess calories with serious comorbidity and body mass index (BMI) of 38.0 to 38.9 in adult ([facility] 03/26/2020  . Statin intolerance 03/26/2020  . Vitamin B12 deficiency 02/22/2018  . Type 2 diabetes mellitus without complication, with long-term current use of insulin ([facility] 06/22/2017  . Acquired hypothyroidism 06/22/2017  . OSA (obstructive sleep apnea) 06/28/2013  . Obesity (BMI 30-39.9) 07/08/2011  . Essential hypertension, benign 10/17/2007  . Chronic ischemic heart disease 10/17/2007  . Hypertension associated with type 2 diabetes mellitus ([facility] 10/14/2007  . Recurrent ventral incisional hernia, periumbilical 068/34/1962 . Osteoarthritis 10/14/2007  . ABNORMAL GLUCOSE NEC 10/14/2007    Past Medical History:  Diagnosis Date  . Abdominal distension   . Allergy    per pt  . Anxiety   . Arthritis  knees   . Constipation   . Diabetes mellitus without complication ([place])   . GERD (gastroesophageal reflux disease)   . Heart murmur   . Hyperlipidemia   . Hypertension   . Hypothyroidism   . Incisional hernia   . Pneumonia    hx of 2006   . Seasonal allergies   . Sleep apnea    cpap x 10 yrs Dr [name]   . Thyroid disease     Past Surgical History:  Procedure Laterality Date  . APPENDECTOMY  1982  . CARDIAC CATHETERIZATION     1999 and negative  . CHOLECYSTECTOMY  2010   lap with open redo umbilical hernia repair.  Dr. [name]  .  COLONOSCOPY  11/18/2016  . HERNIA REPAIR  8341   umbilical open w mesh plug. Dr. [name] and 2010 Dr [name]   . KNEE SURGERY  2006   left - arthroscopic  . VENTRAL HERNIA REPAIR  08/13/2011   Procedure: LAPAROSCOPIC VENTRAL HERNIA;  Surgeon: Adin Hector, MD;  Location: [facility];  Service: General;  Laterality: N/A;  Laparoscopic Ventral Hernia Repair with Mesh    Social History   Socioeconomic History  . Marital status: Married    Spouse name: Not on file  . Number of children: 2  . Years of education: Not on file  . Highest education level: Not on file  Occupational History  . 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 Occupation: care taker  Tobacco Use  . Smoking status: Former Smoker    Packs/day: 0.10    Years: 4.00    Pack years: 0.40    Types: Cigarettes    Quit date: 07/07/1978    Years since quitting: 42.3  . Smokeless tobacco: Never Used  . Tobacco comment: occassional smoker with alcohol consumption  Vaping Use  . Vaping Use: Never used  Substance and Sexual Activity  . Alcohol use: No    Comment: quit 1979  . Drug use: No  . Sexual activity: Yes    Birth control/protection: None    Comment: number of sex partners in the last 33 months 1  Other Topics Concern  . Not on file  Social History Narrative   Lives with husband and his mother is currently living with them after sustaining a fall in Oct. 2013.   Exercise walking 2 times daily   Social Determinants of Health   Financial Resource Strain: Not on file  Food Insecurity: Not on file  Transportation Needs: Not on file  Physical Activity: Not on file  Stress: Not on file  Social Connections: Not on file  Intimate Partner Violence: Not on file    Family History  Problem Relation Age of Onset  . Cancer Mother        colon  . Heart disease Mother 75       congestive heart failure  . COPD Mother   . Colon cancer Mother 45  . Other Father 57       car accident  . Cancer Maternal  Grandmother        ovarian  . Asthma Son   . Allergies Son   . Hypertension Paternal Grandmother   . Cancer Paternal Grandfather        prostate  . Breast cancer Neg Hx   . Colon polyps Neg Hx   . Esophageal cancer Neg Hx   . Rectal cancer Neg Hx   . Stomach cancer Neg Hx      Review of  Systems  Constitutional: Negative.  Negative for chills and fever.  HENT: Negative.  Negative for congestion and sore throat.   Respiratory: Negative.  Negative for cough and shortness of breath.   Cardiovascular: Negative.  Negative for chest pain and palpitations.  Gastrointestinal: Negative.  Negative for abdominal pain, diarrhea, nausea and vomiting.  Genitourinary: Negative.  Negative for dysuria and hematuria.  Musculoskeletal: Negative.   Skin: Negative.  Negative for rash.  Neurological: Negative.  Negative for dizziness and headaches.  All other systems reviewed and are negative.  Today's Vitals   11/05/20 1446  BP: 119/77  Pulse: 78  Resp: 16  Temp: 99.2 F (37.3 C)  TempSrc: Temporal  SpO2: 96%  Weight: 205 lb (93 kg)  Height: _0  (1.575 m)   Body mass index is 37.49 kg/m. Wt Readings from Last 3 Encounters:  11/05/20 205 lb (93 kg)  08/29/20 205 lb 9.6 oz (93.3 kg)  05/06/20 211 lb (95.7 kg)     Physical Exam Vitals reviewed.  Constitutional:      Appearance: Normal appearance.  HENT:     Head: Normocephalic.  Eyes:     Extraocular Movements: Extraocular movements intact.     Pupils: Pupils are equal, round, and reactive to light.  Cardiovascular:     Rate and Rhythm: Normal rate.  Pulmonary:     Effort: Pulmonary effort is normal.  Musculoskeletal:     Cervical back: Normal range of motion.     Comments: Left knee: Mild swelling and mild erythema.  Surgical scar healing well.  Good range of motion.  No signs of infection.  Skin:    General: Skin is warm and dry.     Capillary Refill: Capillary refill takes less than 2 seconds.  Neurological:      General: No focal deficit present.     Mental Status: She is alert and oriented to person, place, and time.  Psychiatric:        Mood and Affect: Mood normal.        Behavior: Behavior normal.     Results for orders placed or performed in visit on 11/05/20 (from the past 24 hour(s))  POCT glucose (manual entry)     Status: None   Collection Time: 11/05/20  2:58 PM  Result Value Ref Range   POC Glucose 97 70 - 99 mg/dl  POCT glycosylated hemoglobin (Hb A1C)     Status: None   Collection Time: 11/05/20  3:11 PM  Result Value Ref Range   Hemoglobin A1C 5.5 4.0 - 5.6 %   HbA1c POC (<> result, manual entry)     HbA1c, POC (prediabetic range)     HbA1c, POC (controlled diabetic range)     A total of 30 minutes was spent with the patient, greater than 50% of which was in counseling/coordination of care regarding diabetes and hypertension and cardiovascular risks associated with these conditions, review of all medications, review of most recent blood work results including today's hemoglobin A1c, education on nutrition, review of most recent office visit notes, health maintenance items including diabetic eye exam, prognosis, documentation, need for follow-up.  ASSESSMENT & PLAN: Hypertension associated with type 2 diabetes mellitus ([place]) Well-controlled hypertension.  Continue present medications.  No changes. Well-controlled diabetes with hemoglobin A1c at 5.5.  Continue present medications.  No changes. Follow-up in 6 months.   Arnetha was seen today for diabetes and hypertension.  Diagnoses and all orders for this visit:  Hypertension associated with type 2 diabetes  mellitus ([place]) -     POCT glucose (manual entry) -     POCT glycosylated hemoglobin (Hb A1C) -     Ambulatory referral to Ophthalmology  Type 2 diabetes mellitus without complication, with long-term current use of insulin ([facility]) -     metFORMIN (GLUCOPHAGE) 1000 MG tablet; Take 1 tablet (1,000 mg total) by mouth 2 (two)  times daily with a meal.  Statin myopathy  Status post left knee replacement  Other orders -     fluticasone (FLONASE) 50 MCG/ACT nasal spray; Place 1 spray into both nostrils daily.    Patient Instructions       If you have lab work done today you will be contacted with your lab results within the next 2 weeks.  If you have not heard from [place] then please contact us. The fastest way to get your results is to register for My Chart.   IF you received an x-ray today, you will receive an invoice from [facility]. Please contact [facility] at [phone] with questions or concerns regarding your invoice.   IF you received labwork today, you will receive an invoice from [facility]. Please contact [facility] at [phone] with questions or concerns regarding your invoice.   Our billing staff will not be able to assist you with questions regarding bills from these companies.  You will be contacted with the lab results as soon as they are available. The fastest way to get your results is to activate your My Chart account. Instructions are located on the last page of this paperwork. If you have not heard from [place] regarding the results in 2 weeks, please contact this office.     Diabetes Mellitus and Nutrition, Adult When you have diabetes, or diabetes mellitus, it is very important to have healthy eating habits because your blood sugar (glucose) levels are greatly affected by what you eat and drink. Eating healthy foods in the right amounts, at about the same times every day, can help you:  Control your blood glucose.  Lower your risk of heart disease.  Improve your blood pressure.  Reach or maintain a healthy weight. What can affect my meal plan? Every person with diabetes is different, and each person has different needs for a meal plan. Your health care provider may recommend that you work with a dietitian to make a meal plan that is best for you. Your meal plan may  vary depending on factors such as:  The calories you need.  The medicines you take.  Your weight.  Your blood glucose, blood pressure, and cholesterol levels.  Your activity level.  Other health conditions you have, such as heart or kidney disease. How do carbohydrates affect me? Carbohydrates, also called carbs, affect your blood glucose level more than any other type of food. Eating carbs naturally raises the amount of glucose in your blood. Carb counting is a method for keeping track of how many carbs you eat. Counting carbs is important to keep your blood glucose at a healthy level, especially if you use insulin or take certain oral diabetes medicines. It is important to know how many carbs you can safely have in each meal. This is different for every person. Your dietitian can help you calculate how many carbs you should have at each meal and for each snack. How does alcohol affect me? Alcohol can cause a sudden decrease in blood glucose (hypoglycemia), especially if you use insulin or take certain oral diabetes medicines. Hypoglycemia can be a  life-threatening condition. Symptoms of hypoglycemia, such as sleepiness, dizziness, and confusion, are similar to symptoms of having too much alcohol.  Do not drink alcohol if: ? Your health care provider tells you not to drink. ? You are pregnant, may be pregnant, or are planning to become pregnant.  If you drink alcohol: ? Do not drink on an empty stomach. ? Limit how much you use to:  0-1 drink a day for women.  0-2 drinks a day for men. ? Be aware of how much alcohol is in your drink. In the [place] one drink equals one 12 oz bottle of beer (355 mL), one 5 oz glass of wine (148 mL), or one 1 oz glass of hard liquor (44 mL). ? Keep yourself hydrated with water, diet soda, or unsweetened iced tea.  Keep in mind that regular soda, juice, and other mixers may contain a lot of sugar and must be counted as carbs. What are tips for  following this plan? Reading food labels  Start by checking the serving size on the "Nutrition Facts" label of packaged foods and drinks. The amount of calories, carbs, fats, and other nutrients listed on the label is based on one serving of the item. Many items contain more than one serving per package.  Check the total grams (g) of carbs in one serving. You can calculate the number of servings of carbs in one serving by dividing the total carbs by 15. For example, if a food has 30 g of total carbs per serving, it would be equal to 2 servings of carbs.  Check the number of grams (g) of saturated fats and trans fats in one serving. Choose foods that have a low amount or none of these fats.  Check the number of milligrams (mg) of salt (sodium) in one serving. Most people should limit total sodium intake to less than 2,300 mg per day.  Always check the nutrition information of foods labeled as "low-fat" or "nonfat." These foods may be higher in added sugar or refined carbs and should be avoided.  Talk to your dietitian to identify your daily goals for nutrients listed on the label. Shopping  Avoid buying canned, pre-made, or processed foods. These foods tend to be high in fat, sodium, and added sugar.  Shop around the outside edge of the grocery store. This is where you will most often find fresh fruits and vegetables, bulk grains, fresh meats, and fresh dairy. Cooking  Use low-heat cooking methods, such as baking, instead of high-heat cooking methods like deep frying.  Cook using healthy oils, such as olive, canola, or sunflower oil.  Avoid cooking with butter, cream, or high-fat meats. Meal planning  Eat meals and snacks regularly, preferably at the same times every day. Avoid going long periods of time without eating.  Eat foods that are high in fiber, such as fresh fruits, vegetables, beans, and whole grains. Talk with your dietitian about how many servings of carbs you can eat at  each meal.  Eat 4-6 oz (112-168 g) of lean protein each day, such as lean meat, chicken, fish, eggs, or tofu. One ounce (oz) of lean protein is equal to: ? 1 oz (28 g) of meat, chicken, or fish. ? 1 egg. ?  cup (62 g) of tofu.  Eat some foods each day that contain healthy fats, such as avocado, nuts, seeds, and fish.   What foods should I eat? Fruits Berries. Apples. Oranges. Peaches. Apricots. Plums. Grapes. Mango. Papaya. Pomegranate. Kiwi.  Cherries. Vegetables Lettuce. Spinach. Leafy greens, including kale, chard, collard greens, and mustard greens. Beets. Cauliflower. Cabbage. Broccoli. Carrots. Green beans. Tomatoes. Peppers. Onions. Cucumbers. Brussels sprouts. Grains Whole grains, such as whole-wheat or whole-grain bread, crackers, tortillas, cereal, and pasta. Unsweetened oatmeal. Quinoa. Brown or wild rice. Meats and other proteins Seafood. Poultry without skin. Lean cuts of poultry and beef. Tofu. Nuts. Seeds. Dairy Low-fat or fat-free dairy products such as milk, yogurt, and cheese. The items listed above may not be a complete list of foods and beverages you can eat. Contact a dietitian for more information. What foods should I avoid? Fruits Fruits canned with syrup. Vegetables Canned vegetables. Frozen vegetables with butter or cream sauce. Grains Refined white flour and flour products such as bread, pasta, snack foods, and cereals. Avoid all processed foods. Meats and other proteins Fatty cuts of meat. Poultry with skin. Breaded or fried meats. Processed meat. Avoid saturated fats. Dairy Full-fat yogurt, cheese, or milk. Beverages Sweetened drinks, such as soda or iced tea. The items listed above may not be a complete list of foods and beverages you should avoid. Contact a dietitian for more information. Questions to ask a health care provider  Do I need to meet with a diabetes educator?  Do I need to meet with a dietitian?  What number can I call if I have  questions?  When are the best times to check my blood glucose? Where to find more information:  American Diabetes Association: diabetes.org  Academy of Nutrition and Dietetics: www.eatright.[employer] of Diabetes and Digestive and Kidney Diseases: [employer]  Association of Diabetes Care and Education Specialists: www.diabeteseducator.org Summary  It is important to have healthy eating habits because your blood sugar (glucose) levels are greatly affected by what you eat and drink.  A healthy meal plan will help you control your blood glucose and maintain a healthy lifestyle.  Your health care provider may recommend that you work with a dietitian to make a meal plan that is best for you.  Keep in mind that carbohydrates (carbs) and alcohol have immediate effects on your blood glucose levels. It is important to count carbs and to use alcohol carefully. This information is not intended to replace advice given to you by your health care provider. Make sure you discuss any questions you have with your health care provider. Document Revised: 07/18/2019 Document Reviewed: 07/18/2019 Elsevier Patient Education  2021 Elsevier Inc.      Agustina Caroli, MD [facility]

## 2020-11-05 NOTE — Progress Notes (Signed)
The 10-year ASCVD risk score Mikey Bussing DC Brooke Bonito., et al., 2013) is: 10.2%   Values used to calculate the score:     Age: 65 years     Sex: Female     Is Non-Hispanic African American: No     Diabetic: Yes     Tobacco smoker: No     Systolic Blood Pressure: 224 mmHg     Is BP treated: Yes     HDL Cholesterol: 49 mg/dL     Total Cholesterol: 161 mg/dL Lab Results  Component Value Date   CHOL 161 09/26/2019   HDL 49 09/26/2019   LDLCALC 90 09/26/2019   TRIG 124 09/26/2019   CHOLHDL 3.3 09/26/2019   Wt Readings from Last 3 Encounters:  11/05/20 205 lb (93 kg)  08/29/20 205 lb 9.6 oz (93.3 kg)  05/06/20 211 lb (95.7 kg)

## 2020-11-05 NOTE — Assessment & Plan Note (Signed)
Well-controlled hypertension.  Continue present medications.  No changes. Well-controlled diabetes with hemoglobin A1c at 5.5.  Continue present medications.  No changes. Follow-up in 6 months.

## 2020-11-14 DIAGNOSIS — Z96652 Presence of left artificial knee joint: Secondary | ICD-10-CM | POA: Diagnosis not present

## 2020-11-14 DIAGNOSIS — Z471 Aftercare following joint replacement surgery: Secondary | ICD-10-CM | POA: Diagnosis not present

## 2020-12-13 ENCOUNTER — Ambulatory Visit
Admission: EM | Admit: 2020-12-13 | Discharge: 2020-12-13 | Disposition: A | Discharge status: Home / Self Care | Payer: Federal, State, Local not specified - PPO | Attending: Family Medicine | Admitting: Family Medicine

## 2020-12-13 ENCOUNTER — Other Ambulatory Visit: Payer: Self-pay

## 2020-12-13 DIAGNOSIS — R1032 Left lower quadrant pain: Secondary | ICD-10-CM | POA: Diagnosis not present

## 2020-12-13 LAB — POCT URINALYSIS DIP (MANUAL ENTRY)
Bilirubin, UA: NEGATIVE
Blood, UA: NEGATIVE
Glucose, UA: NEGATIVE mg/dL
Ketones, POC UA: NEGATIVE mg/dL
Leukocytes, UA: NEGATIVE
Nitrite, UA: NEGATIVE
Protein Ur, POC: NEGATIVE mg/dL
Spec Grav, UA: 1.02 (ref 1.010–1.025)
Urobilinogen, UA: 0.2 E.U./dL
pH, UA: 6 (ref 5.0–8.0)

## 2020-12-13 MED ORDER — ONDANSETRON 4 MG PO TBDP: 4.0000 mg | ORAL_TABLET | Freq: Three times a day (TID) | ORAL | 0 refills | Status: DC | PRN

## 2020-12-13 NOTE — ED Triage Notes (Signed)
Pt c/o urinary frequency with blood when wiping since Wednesday. Pt c/o lower abdominal pain with diarrhea since Wednesday. States LLQ area is the worse. States also having fatigue.

## 2020-12-14 DIAGNOSIS — R1032 Left lower quadrant pain: Secondary | ICD-10-CM | POA: Diagnosis not present

## 2020-12-15 LAB — CBC WITH DIFFERENTIAL/PLATELET
Basophils Absolute: 0.1 10*3/uL (ref 0.0–0.2)
Basos: 1 %
EOS (ABSOLUTE): 0.5 10*3/uL — ABNORMAL HIGH (ref 0.0–0.4)
Eos: 5 %
Hematocrit: 42.1 % (ref 34.0–46.6)
Hemoglobin: 13.3 g/dL (ref 11.1–15.9)
Immature Grans (Abs): 0 10*3/uL (ref 0.0–0.1)
Immature Granulocytes: 0 %
Lymphocytes Absolute: 2 10*3/uL (ref 0.7–3.1)
Lymphs: 20 %
MCH: 27.8 pg (ref 26.6–33.0)
MCHC: 31.6 g/dL (ref 31.5–35.7)
MCV: 88 fL (ref 79–97)
Monocytes Absolute: 0.8 10*3/uL (ref 0.1–0.9)
Monocytes: 7 %
Neutrophils Absolute: 7.1 10*3/uL — ABNORMAL HIGH (ref 1.4–7.0)
Neutrophils: 67 %
Platelets: 254 10*3/uL (ref 150–450)
RBC: 4.78 x10E6/uL (ref 3.77–5.28)
RDW: 13.2 % (ref 11.7–15.4)
WBC: 10.4 10*3/uL (ref 3.4–10.8)

## 2020-12-15 LAB — COMPREHENSIVE METABOLIC PANEL
ALT: 30 IU/L (ref 0–32)
AST: 28 IU/L (ref 0–40)
Albumin/Globulin Ratio: 1.5 (ref 1.2–2.2)
Albumin: 4.5 g/dL (ref 3.8–4.8)
Alkaline Phosphatase: 115 IU/L (ref 44–121)
BUN/Creatinine Ratio: 29 — ABNORMAL HIGH (ref 12–28)
BUN: 17 mg/dL (ref 8–27)
Bilirubin Total: 0.3 mg/dL (ref 0.0–1.2)
CO2: 18 mmol/L — ABNORMAL LOW (ref 20–29)
Calcium: 9.6 mg/dL (ref 8.7–10.3)
Chloride: 100 mmol/L (ref 96–106)
Creatinine, Ser: 0.58 mg/dL (ref 0.57–1.00)
Globulin, Total: 3.1 g/dL (ref 1.5–4.5)
Glucose: 89 mg/dL (ref 65–99)
Potassium: 4.3 mmol/L (ref 3.5–5.2)
Sodium: 139 mmol/L (ref 134–144)
Total Protein: 7.6 g/dL (ref 6.0–8.5)
eGFR: 101 mL/min/{1.73_m2} (ref >59)

## 2020-12-15 LAB — LIPASE: Lipase: 36 U/L (ref 14–72)

## 2020-12-15 LAB — URINE CULTURE

## 2020-12-15 NOTE — ED Provider Notes (Signed)
[place]    CSN: [id number] Arrival date & time: 12/13/20  1756      History   Chief Complaint Chief Complaint  Patient presents with  . Urinary Tract Infection    HPI Katherine Sanchez is a 65 y.o. female.   Patient presenting today with lower abdominal pain worse in left lower quadrant, fatigue, diarrhea, nausea x2 days.  She also states she noticed some blood with urination intermittently.  She denies fever, chills, body aches, intolerance to p.o., dizziness, syncope, chest pain, shortness of breath.  Son and husband also with similar symptoms.  So far just trying to drink plenty of fluids, not try any medications for symptoms.  No known chronic GI issues.  History of appendectomy and cholecystectomy, recent colonoscopy benign per patient.     Past Medical History:  Diagnosis Date  . Abdominal distension   . Allergy    per pt  . Anxiety   . Arthritis    knees   . Constipation   . Diabetes mellitus without complication ([place])   . GERD (gastroesophageal reflux disease)   . Heart murmur   . Hyperlipidemia   . Hypertension   . Hypothyroidism   . Incisional hernia   . Pneumonia    hx of 2006   . Seasonal allergies   . Sleep apnea    cpap x 10 yrs Dr [name]   . Thyroid disease     Patient Active Problem List   Diagnosis Date Noted  . Class 2 severe obesity due to excess calories with serious comorbidity and body mass index (BMI) of 38.0 to 38.9 in adult ([place]) 03/26/2020  . Statin intolerance 03/26/2020  . Vitamin B12 deficiency 02/22/2018  . Type 2 diabetes mellitus without complication, with long-term current use of insulin ([place]) 06/22/2017  . Acquired hypothyroidism 06/22/2017  . OSA (obstructive sleep apnea) 06/28/2013  . Obesity (BMI 30-39.9) 07/08/2011  . Essential hypertension, benign 10/17/2007  . Chronic ischemic heart disease 10/17/2007  . Hypertension associated with type 2 diabetes mellitus ([place]) 10/14/2007  . Recurrent ventral  incisional hernia, periumbilical 89/38/1017  . Osteoarthritis 10/14/2007  . ABNORMAL GLUCOSE NEC 10/14/2007    Past Surgical History:  Procedure Laterality Date  . APPENDECTOMY  1982  . CARDIAC CATHETERIZATION     1999 and negative  . CHOLECYSTECTOMY  2010   lap with open redo umbilical hernia repair.  Dr. [name]  . COLONOSCOPY  11/18/2016  . HERNIA REPAIR  5102   umbilical open w mesh plug. Dr. [name] and 2010 Dr [name]   . KNEE SURGERY  2006   left - arthroscopic  . VENTRAL HERNIA REPAIR  08/13/2011   Procedure: LAPAROSCOPIC VENTRAL HERNIA;  Surgeon: Adin Hector, MD;  Location: [facility];  Service: General;  Laterality: N/A;  Laparoscopic Ventral Hernia Repair with Mesh    OB History   No obstetric history on file.      Home Medications    Prior to Admission medications   Medication Sig Start Date End Date Taking? Authorizing Provider  ondansetron (ZOFRAN ODT) 4 MG disintegrating tablet Take 1 tablet (4 mg total) by mouth every 8 (eight) hours as needed for nausea or vomiting. 12/13/20  Yes Volney American, PA-C  amLODipine-benazepril (LOTREL) 10-20 MG capsule TAKE 1 CAPSULE BY MOUTH EVERY DAY 05/31/20   Horald Pollen, MD  Ascorbic Acid (VITAMIN C) POWD Take 1,000 mg by mouth daily.    [provider]  aspirin 81 MG tablet  Take 81 mg by mouth daily.    [provider]  blood glucose meter kit and supplies KIT Dispense based on patient and insurance preference. Use up to one time as directed  (FOR ICD-9 250.00, 250.01). 03/24/15   Le, [name], DO  docusate sodium (COLACE) 100 MG capsule Take 100 mg by mouth 2 (two) times daily as needed.    [provider]  Dulaglutide (TRULICITY) 6.04 VW/0.9WJ SOPN INJECT 0.75 MG INTO THE SKIN ONCE A WEEK 03/26/20   Horald Pollen, MD  fluticasone [facility]) 50 MCG/ACT nasal spray Place 1 spray into both nostrils daily. 11/05/20   Horald Pollen, MD  glucose blood (ONE TOUCH ULTRA TEST)  test strip USE UP TO 1 TIME AS DIRECTED 04/27/19   Horald Pollen, MD  ibuprofen (ADVIL,MOTRIN) 200 MG tablet Take 200 mg by mouth every 6 (six) hours as needed.    [provider]  levothyroxine (SYNTHROID) 50 MCG tablet TAKE 1 TABLET BY MOUTH EVERY DAY 07/29/20   Horald Pollen, MD  meloxicam (MOBIC) 15 MG tablet Take 1 tablet (15 mg total) by mouth daily as needed for pain. 08/05/16   Shawnee Knapp, MD  metFORMIN (GLUCOPHAGE) 1000 MG tablet Take 1 tablet (1,000 mg total) by mouth 2 (two) times daily with a meal. 11/05/20   Sagardia, Ines Bloomer, MD  OneTouch Delica Lancets 19J Wilson USE UP TO 1 TIME AS DIRECTED 03/26/20   Horald Pollen, MD    Family History Family History  Problem Relation Age of Onset  . Cancer Mother        colon  . Heart disease Mother 88       congestive heart failure  . COPD Mother   . Colon cancer Mother 19  . Other Father 59       car accident  . Cancer Maternal Grandmother        ovarian  . Asthma Son   . Allergies Son   . Hypertension Paternal Grandmother   . Cancer Paternal Grandfather        prostate  . Breast cancer Neg Hx   . Colon polyps Neg Hx   . Esophageal cancer Neg Hx   . Rectal cancer Neg Hx   . Stomach cancer Neg Hx     Social History Social History   Tobacco Use  . Smoking status: Former Smoker    Packs/day: 0.10    Years: 4.00    Pack years: 0.40    Types: Cigarettes    Quit date: 07/07/1978    Years since quitting: 42.4  . Smokeless tobacco: Never Used  . Tobacco comment: occassional smoker with alcohol consumption  Vaping Use  . Vaping Use: Never used  Substance Use Topics  . Alcohol use: No    Comment: quit 1979  . Drug use: No     Allergies   Amoxicillin   Review of Systems Review of Systems Per HPI  Physical Exam Triage Vital Signs ED Triage Vitals  Enc Vitals Group     BP 12/13/20 1842 (!) 144/84     Pulse Rate 12/13/20 1842 80     Resp 12/13/20 1842 20     Temp 12/13/20 1842  99.4 F (37.4 C)     Temp Source 12/13/20 1842 Oral     SpO2 12/13/20 1842 95 %     Weight --      Height --      Head Circumference --  Peak Flow --      Pain Score 12/13/20 1855 6     Pain Loc --      Pain Edu? --      Excl. in [place]? --    No data found.  Updated Vital Signs BP (!) 144/84 (BP Location: Left Arm)   Pulse 80   Temp 99.4 F (37.4 C) (Oral)   Resp 20   SpO2 95%   Visual Acuity Right Eye Distance:   Left Eye Distance:   Bilateral Distance:    Right Eye Near:   Left Eye Near:    Bilateral Near:     Physical Exam Vitals and nursing note reviewed.  Constitutional:      Appearance: Normal appearance. She is not ill-appearing.  HENT:     Head: Atraumatic.     Right Ear: Tympanic membrane normal.     Left Ear: Tympanic membrane normal.     Mouth/Throat:     Mouth: Mucous membranes are moist.     Pharynx: Oropharynx is clear. No posterior oropharyngeal erythema.  Eyes:     Extraocular Movements: Extraocular movements intact.     Conjunctiva/sclera: Conjunctivae normal.  Cardiovascular:     Rate and Rhythm: Normal rate and regular rhythm.     Heart sounds: Normal heart sounds.  Pulmonary:     Effort: Pulmonary effort is normal. No respiratory distress.     Breath sounds: Normal breath sounds. No wheezing or rales.  Abdominal:     General: Bowel sounds are normal. There is no distension.     Palpations: Abdomen is soft. There is no mass.     Tenderness: There is abdominal tenderness. There is no right CVA tenderness, left CVA tenderness, guarding or rebound.     Comments: Diffuse lower abdominal tenderness palpation  Musculoskeletal:        General: Normal range of motion.     Cervical back: Normal range of motion and neck supple.  Skin:    General: Skin is warm and dry.  Neurological:     Mental Status: She is alert and oriented to person, place, and time.  Psychiatric:        Mood and Affect: Mood normal.        Thought Content: Thought  content normal.        Judgment: Judgment normal.     UC Treatments / Results  Labs (all labs ordered are listed, but only abnormal results are displayed) Labs Reviewed  CBC WITH DIFFERENTIAL/PLATELET - Abnormal; Notable for the following components:      Result Value   Neutrophils Absolute 7.1 (*)    EOS (ABSOLUTE) 0.5 (*)    All other components within normal limits   Narrative:    Performed at:  [address], [place], [place]  [id number] Lab Director: Rush Farmer MD, Phone:  [phone]  COMPREHENSIVE METABOLIC PANEL - Abnormal; Notable for the following components:   BUN/Creatinine Ratio 29 (*)    CO2 18 (*)    All other components within normal limits   Narrative:    Performed at:  [address] [place], [place]  [id number] Lab Director: Rush Farmer MD, Phone:  [phone]  URINE CULTURE  LIPASE   Narrative:    Performed at:  [facility] [address], [place], [place]  [id number] Lab Director: Rush Farmer MD, Phone:  [phone]  POCT URINALYSIS DIP (MANUAL ENTRY)    EKG   Radiology No  results found.  Procedures Procedures (including critical care time)  Medications Ordered in UC Medications - No data to display  Initial Impression / Assessment and Plan / UC Course  I have reviewed the triage vital signs and the nursing notes.  Pertinent labs & imaging results that were available during my care of the patient were reviewed by me and considered in my medical decision making (see chart for details).     Suspect viral GI illness, particularly given household members also sick with similar symptoms.  Given severity of her pain, will obtain basic labs for rule out more serious causes.  Discussed if pain continues to worsen or having any other worsening symptoms that she will need to go to the emergency room for imaging and further evaluation.  Will give Zofran for as needed nausea, discussed  brat diet, push fluids, rest.  Final Clinical Impressions(s) / UC Diagnoses   Final diagnoses:  LLQ pain   Discharge Instructions   None    ED Prescriptions    Medication Sig Dispense Auth. Provider   ondansetron (ZOFRAN ODT) 4 MG disintegrating tablet Take 1 tablet (4 mg total) by mouth every 8 (eight) hours as needed for nausea or vomiting. 20 tablet Volney American, [place]     PDMP not reviewed this encounter.   Volney American, [place] 12/15/20 [phone]

## 2020-12-26 DIAGNOSIS — M25562 Pain in left knee: Secondary | ICD-10-CM | POA: Diagnosis not present

## 2020-12-31 DIAGNOSIS — G4733 Obstructive sleep apnea (adult) (pediatric): Secondary | ICD-10-CM | POA: Diagnosis not present

## 2021-01-08 ENCOUNTER — Encounter: Payer: Self-pay | Admitting: Neurology

## 2021-01-09 ENCOUNTER — Telehealth: Payer: Self-pay | Admitting: Neurology

## 2021-01-09 DIAGNOSIS — G4733 Obstructive sleep apnea (adult) (pediatric): Secondary | ICD-10-CM

## 2021-01-09 DIAGNOSIS — Z9989 Dependence on other enabling machines and devices: Secondary | ICD-10-CM

## 2021-01-09 NOTE — Telephone Encounter (Signed)
I called pt. No answer, left a message asking pt to call me back.   

## 2021-01-09 NOTE — Telephone Encounter (Signed)
I received compliance data from her AutoPap machine from 12/31/2020 through 01/08/2021.  Patient is 100% compliant with an average usage of 8 hours and 9 minutes for the 9 days.  Residual AHI is mildly elevated at 10.4/h, 95th percentile at 14.2 cm with a range of 5 to 15 cm.  Leak is on the higher side with a 95th percentile at 25.6 L/min.  Please inquire with her DME company if this is a new machine.  She will need to follow-up within 30 to 90 days of getting set up on the new equipment.  Please also check with the patient if she is agreeable to increasing the maximum pressure to 16 cm.  If she is agreeable, we will change her AutoPap settings from 5 to 15 cm to 5 to 16 cm at this time.  Please also facilitate follow-up appointment with Amy or myself according to set up date, I am not sure if this is a new machine for her.

## 2021-01-09 NOTE — Telephone Encounter (Signed)
Patient called me back, and we discussed message. Patient is agreeable to pressure change from 5-15 to new pressure setting of 5-16.    I have made the pressure change in [employer], and patient will call back next week to update on how she is doing.

## 2021-01-09 NOTE — Addendum Note (Signed)
Addended by: Verlin Grills on: 01/09/2021 05:00 PM   Modules accepted: Orders

## 2021-01-24 ENCOUNTER — Telehealth: Payer: Self-pay | Admitting: Emergency Medicine

## 2021-01-24 NOTE — Telephone Encounter (Signed)
1.Medication Requested: levothyroxine (SYNTHROID) 50 MCG tablet    2. Pharmacy (Name, Street, [place]): [id number] - [place], [place]  3. On Med List: yes   4. Last Visit with PCP: 11-05-20  5. Next visit date with PCP: n/a    Agent: Please be advised that RX refills may take up to 3 business days. We ask that you follow-up with your pharmacy.

## 2021-01-27 ENCOUNTER — Other Ambulatory Visit: Payer: Self-pay | Admitting: Emergency Medicine

## 2021-01-27 DIAGNOSIS — E039 Hypothyroidism, unspecified: Secondary | ICD-10-CM

## 2021-01-29 ENCOUNTER — Other Ambulatory Visit: Payer: Self-pay | Admitting: *Deleted

## 2021-01-29 DIAGNOSIS — E119 Type 2 diabetes mellitus without complications: Secondary | ICD-10-CM

## 2021-01-29 MED ORDER — METFORMIN HCL 1000 MG PO TABS: 1000.0000 mg | ORAL_TABLET | Freq: Two times a day (BID) | ORAL | 3 refills | Status: DC

## 2021-01-29 NOTE — Telephone Encounter (Signed)
Spoke to patient about refill on Levothyroxine she picked up already. Patient requested refill for Metformin, it was sent to pharmacy today. Patient will call to schedule a follow up appointment for the fall.

## 2021-01-31 DIAGNOSIS — G4733 Obstructive sleep apnea (adult) (pediatric): Secondary | ICD-10-CM | POA: Diagnosis not present

## 2021-02-20 ENCOUNTER — Other Ambulatory Visit: Payer: Self-pay | Admitting: Emergency Medicine

## 2021-02-20 DIAGNOSIS — E039 Hypothyroidism, unspecified: Secondary | ICD-10-CM

## 2021-03-02 DIAGNOSIS — G4733 Obstructive sleep apnea (adult) (pediatric): Secondary | ICD-10-CM | POA: Diagnosis not present

## 2021-03-12 ENCOUNTER — Encounter: Payer: Self-pay | Admitting: Adult Health

## 2021-03-12 ENCOUNTER — Ambulatory Visit: Payer: Federal, State, Local not specified - PPO | Admitting: Adult Health

## 2021-03-12 VITALS — BP 119/76 | HR 61 | Ht 62.0 in | Wt 208.0 lb

## 2021-03-12 DIAGNOSIS — G4733 Obstructive sleep apnea (adult) (pediatric): Secondary | ICD-10-CM | POA: Diagnosis not present

## 2021-03-12 DIAGNOSIS — Z9989 Dependence on other enabling machines and devices: Secondary | ICD-10-CM | POA: Diagnosis not present

## 2021-03-12 NOTE — Progress Notes (Addendum)
PATIENT: Katherine Sanchez DOB: [birth year]  REASON FOR VISIT: follow up HISTORY FROM: patient PRIMARY NEUROLOGIST: Dr. Rexene Alberts  HISTORY OF PRESENT ILLNESS: Today 03/12/21:  Ms. Katherine Sanchez is a 65 year old female with a history of obstructive sleep apnea on CPAP.  She returns today for follow-up.  She reports that the CPAP is working well.  She really enjoys her new CPAP.  She states occasionally during the night that pressure may feel too high but she does not want to adjust it at this time.  She returns today for an evaluation.      REVIEW OF SYSTEMS: Out of a complete 14 system review of symptoms, the patient complains only of the following symptoms, and all other reviewed systems are negative.   ESS 7  ALLERGIES: Allergies  Allergen Reactions   Amoxicillin     hives hives hives    HOME MEDICATIONS: Outpatient Medications Prior to Visit  Medication Sig Dispense Refill   amLODipine-benazepril (LOTREL) 10-20 MG capsule TAKE 1 CAPSULE BY MOUTH EVERY DAY 90 capsule 3   Ascorbic Acid (VITAMIN C) POWD Take 1,000 mg by mouth daily.     aspirin 81 MG tablet Take 81 mg by mouth daily.     blood glucose meter kit and supplies KIT Dispense based on patient and insurance preference. Use up to one time as directed  (FOR ICD-9 250.00, 250.01). 1 each 0   docusate sodium (COLACE) 100 MG capsule Take 100 mg by mouth 2 (two) times daily as needed.     Dulaglutide (TRULICITY) 6.28 ZM/6.2HU SOPN INJECT 0.75 MG INTO THE SKIN ONCE A WEEK 6 pen 6   fluticasone (FLONASE) 50 MCG/ACT nasal spray Place 1 spray into both nostrils daily. 16 g 2   glucose blood (ONE TOUCH ULTRA TEST) test strip USE UP TO 1 TIME AS DIRECTED 25 each 3   ibuprofen (ADVIL,MOTRIN) 200 MG tablet Take 200 mg by mouth every 6 (six) hours as needed.     levothyroxine (SYNTHROID) 50 MCG tablet TAKE 1 TABLET BY MOUTH EVERY DAY 90 tablet 0   meloxicam (MOBIC) 15 MG tablet Take 1 tablet (15 mg total) by mouth daily as needed for  pain. 90 tablet 0   metFORMIN (GLUCOPHAGE) 1000 MG tablet Take 1 tablet (1,000 mg total) by mouth 2 (two) times daily with a meal. 180 tablet 3   ondansetron (ZOFRAN ODT) 4 MG disintegrating tablet Take 1 tablet (4 mg total) by mouth every 8 (eight) hours as needed for nausea or vomiting. 20 tablet 0   OneTouch Delica Lancets 76L MISC USE UP TO 1 TIME AS DIRECTED 100 each 11   No facility-administered medications prior to visit.    PAST MEDICAL HISTORY: Past Medical History:  Diagnosis Date   Abdominal distension    Allergy    per pt   Anxiety    Arthritis    knees    Constipation    Diabetes mellitus without complication ([facility])    GERD (gastroesophageal reflux disease)    Heart murmur    Hyperlipidemia    Hypertension    Hypothyroidism    Incisional hernia    Pneumonia    hx of 2006    Seasonal allergies    Sleep apnea    cpap x 10 yrs Dr [name]    Thyroid disease     PAST SURGICAL HISTORY: Past Surgical History:  Procedure Laterality Date   [place] and  negative   CHOLECYSTECTOMY  2010   lap with open redo umbilical hernia repair.  Dr. [name]   COLONOSCOPY  11/18/2016   HERNIA REPAIR  2426   umbilical open w mesh plug. Dr. [name] and 2010 Dr [name]    KNEE SURGERY  2006   left - arthroscopic   VENTRAL HERNIA REPAIR  08/13/2011   Procedure: LAPAROSCOPIC VENTRAL HERNIA;  Surgeon: Adin Hector, MD;  Location: [facility];  Service: General;  Laterality: N/A;  Laparoscopic Ventral Hernia Repair with Mesh    FAMILY HISTORY: Family History  Problem Relation Age of Onset   Cancer Mother        colon   Heart disease Mother 31       congestive heart failure   COPD Mother    Colon cancer Mother 73   Other Father 98       car accident   Cancer Maternal Grandmother        ovarian   Asthma Son    Allergies Son    Hypertension Paternal Grandmother    Cancer Paternal Grandfather        prostate   Breast cancer Neg  Hx    Colon polyps Neg Hx    Esophageal cancer Neg Hx    Rectal cancer Neg Hx    Stomach cancer Neg Hx     SOCIAL HISTORY: Social History   Socioeconomic History   Marital status: Married    Spouse name: Not on file   Number of children: 2   Years of education: Not on file   Highest education level: Not on file  Occupational History   Occupation: Social research officer, 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   Occupation: care taker  Tobacco Use   Smoking status: Former    Packs/day: 0.10    Years: 4.00    Pack years: 0.40    Types: Cigarettes    Quit date: 07/07/1978    Years since quitting: 42.7   Smokeless tobacco: Never   Tobacco comments:    occassional smoker with alcohol consumption  Vaping Use   Vaping Use: Never used  Substance and Sexual Activity   Alcohol use: No    Comment: quit 1979   Drug use: No   Sexual activity: Yes    Birth control/protection: None    Comment: number of sex partners in the last 62 months 1  Other Topics Concern   Not on file  Social History Narrative   Lives with husband and his mother is currently living with them after sustaining a fall in Oct. 2013.   Exercise walking 2 times daily   Social Determinants of Health   Financial Resource Strain: Not on file  Food Insecurity: Not on file  Transportation Needs: Not on file  Physical Activity: Not on file  Stress: Not on file  Social Connections: Not on file  Intimate Partner Violence: Not on file      PHYSICAL EXAM  Vitals:   03/12/21 0938  BP: 119/76  Pulse: 61  Weight: 208 lb (94.3 kg)  Height: '5\' 2"'  (1.575 m)   Body mass index is 38.04 kg/m.  Generalized: Well developed, in no acute distress  Chest: Lungs clear to auscultation bilaterally  Neurological examination  Mentation: Alert oriented to time, place, history taking. Follows all commands speech and language fluent Cranial nerve II-XII: Extraocular movements were full, visual field were full on confrontational test Head turning  and shoulder shrug  were normal and symmetric. Motor: The motor testing reveals 5  over 5 strength of all 4 extremities. Good symmetric motor tone is noted throughout.  Sensory: Sensory testing is intact to soft touch on all 4 extremities. No evidence of extinction is noted.  Gait and station: Gait is normal.    DIAGNOSTIC DATA (LABS, IMAGING, TESTING) - I reviewed patient records, labs, notes, testing and imaging myself where available.  Lab Results  Component Value Date   WBC 10.4 12/14/2020   HGB 13.3 12/14/2020   HCT 42.1 12/14/2020   MCV 88 12/14/2020   PLT 254 12/14/2020      Component Value Date/Time   NA 139 12/14/2020 1937   K 4.3 12/14/2020 1937   CL 100 12/14/2020 1937   CO2 18 (L) 12/14/2020 1937   GLUCOSE 89 12/14/2020 1937   GLUCOSE 105 (H) 06/12/2016 1311   BUN 17 12/14/2020 1937   CREATININE 0.58 12/14/2020 1937   CREATININE 0.69 06/12/2016 1311   CALCIUM 9.6 12/14/2020 1937   PROT 7.6 12/14/2020 1937   ALBUMIN 4.5 12/14/2020 1937   AST 28 12/14/2020 1937   ALT 30 12/14/2020 1937   ALKPHOS 115 12/14/2020 1937   BILITOT 0.3 12/14/2020 1937   GFRNONAA 96 08/29/2020 1556   GFRNONAA >89 03/24/2015 1125   GFRAA 111 08/29/2020 1556   GFRAA >89 03/24/2015 1125   Lab Results  Component Value Date   CHOL 161 09/26/2019   HDL 49 09/26/2019   LDLCALC 90 09/26/2019   TRIG 124 09/26/2019   CHOLHDL 3.3 09/26/2019   Lab Results  Component Value Date   HGBA1C 5.5 11/05/2020   Lab Results  Component Value Date   VITAMINB12 181 (L) 02/22/2018   Lab Results  Component Value Date   TSH 2.740 02/22/2018      ASSESSMENT AND PLAN 65 y.o. year old female  has a past medical history of Abdominal distension, Allergy, Anxiety, Arthritis, Constipation, Diabetes mellitus without complication ([place]), GERD (gastroesophageal reflux disease), Heart murmur, Hyperlipidemia, Hypertension, Hypothyroidism, Incisional hernia, Pneumonia, Seasonal allergies, Sleep apnea, and  Thyroid disease. here with:  OSA on CPAP  - CPAP compliance excellent - Good treatment of AHI  - Encourage patient to use CPAP nightly and > 4 hours each night - F/U in 1 year or sooner if needed    Ward Givens, MSN, NP-C 03/12/2021, 9:45 AM [facility] [address], [place], [place] [address] [phone]  I reviewed the above note and documentation by the Nurse Practitioner and agree with the history, exam, assessment and plan as outlined above. I was available for consultation. Star Age, MD, PhD [facility]

## 2021-03-12 NOTE — Patient Instructions (Signed)
Continue using CPAP nightly and greater than 4 hours each night °If your symptoms worsen or you develop new symptoms please let us know.  ° °

## 2021-04-02 DIAGNOSIS — G4733 Obstructive sleep apnea (adult) (pediatric): Secondary | ICD-10-CM | POA: Diagnosis not present

## 2021-04-18 DIAGNOSIS — Z471 Aftercare following joint replacement surgery: Secondary | ICD-10-CM | POA: Diagnosis not present

## 2021-04-18 DIAGNOSIS — M1711 Unilateral primary osteoarthritis, right knee: Secondary | ICD-10-CM | POA: Diagnosis not present

## 2021-04-18 DIAGNOSIS — Z96652 Presence of left artificial knee joint: Secondary | ICD-10-CM | POA: Diagnosis not present

## 2021-04-30 ENCOUNTER — Encounter: Payer: Self-pay | Admitting: Emergency Medicine

## 2021-04-30 ENCOUNTER — Other Ambulatory Visit: Payer: Self-pay

## 2021-04-30 ENCOUNTER — Ambulatory Visit: Payer: Federal, State, Local not specified - PPO | Admitting: Emergency Medicine

## 2021-04-30 VITALS — BP 128/86 | HR 71 | Temp 98.3°F | Ht 62.0 in | Wt 211.0 lb

## 2021-04-30 DIAGNOSIS — I152 Hypertension secondary to endocrine disorders: Secondary | ICD-10-CM

## 2021-04-30 DIAGNOSIS — M170 Bilateral primary osteoarthritis of knee: Secondary | ICD-10-CM

## 2021-04-30 DIAGNOSIS — Z01818 Encounter for other preprocedural examination: Secondary | ICD-10-CM | POA: Diagnosis not present

## 2021-04-30 DIAGNOSIS — G4733 Obstructive sleep apnea (adult) (pediatric): Secondary | ICD-10-CM | POA: Diagnosis not present

## 2021-04-30 DIAGNOSIS — E1159 Type 2 diabetes mellitus with other circulatory complications: Secondary | ICD-10-CM | POA: Diagnosis not present

## 2021-04-30 LAB — HEMOGLOBIN A1C: Hgb A1c MFr Bld: 5.9 % (ref 4.6–6.5)

## 2021-04-30 LAB — CBC WITH DIFFERENTIAL/PLATELET
Basophils Absolute: 0.1 10*3/uL (ref 0.0–0.1)
Basophils Relative: 0.9 % (ref 0.0–3.0)
Eosinophils Absolute: 0.2 10*3/uL (ref 0.0–0.7)
Eosinophils Relative: 3.3 % (ref 0.0–5.0)
HCT: 41 % (ref 36.0–46.0)
Hemoglobin: 13.5 g/dL (ref 12.0–15.0)
Lymphocytes Relative: 33.4 % (ref 12.0–46.0)
Lymphs Abs: 2.2 10*3/uL (ref 0.7–4.0)
MCHC: 33.1 g/dL (ref 30.0–36.0)
MCV: 85.9 fl (ref 78.0–100.0)
Monocytes Absolute: 0.5 10*3/uL (ref 0.1–1.0)
Monocytes Relative: 7.8 % (ref 3.0–12.0)
Neutro Abs: 3.5 10*3/uL (ref 1.4–7.7)
Neutrophils Relative %: 54.6 % (ref 43.0–77.0)
Platelets: 268 10*3/uL (ref 150.0–400.0)
RBC: 4.77 Mil/uL (ref 3.87–5.11)
RDW: 14.2 % (ref 11.5–15.5)
WBC: 6.5 10*3/uL (ref 4.0–10.5)

## 2021-04-30 LAB — COMPREHENSIVE METABOLIC PANEL
ALT: 35 U/L (ref 0–35)
AST: 28 U/L (ref 0–37)
Albumin: 4.2 g/dL (ref 3.5–5.2)
Alkaline Phosphatase: 84 U/L (ref 39–117)
BUN: 13 mg/dL (ref 6–23)
CO2: 23 mEq/L (ref 19–32)
Calcium: 9.7 mg/dL (ref 8.4–10.5)
Chloride: 104 mEq/L (ref 96–112)
Creatinine, Ser: 0.51 mg/dL (ref 0.40–1.20)
GFR: 98.34 mL/min (ref >60.00)
Glucose, Bld: 91 mg/dL (ref 70–99)
Potassium: 3.7 mEq/L (ref 3.5–5.1)
Sodium: 137 mEq/L (ref 135–145)
Total Bilirubin: 0.4 mg/dL (ref 0.2–1.2)
Total Protein: 7.3 g/dL (ref 6.0–8.3)

## 2021-04-30 NOTE — Progress Notes (Signed)
Lab Results  Component Value Date   HGBA1C 5.5 11/05/2020   BP Readings from Last 3 Encounters:  04/30/21 128/86  03/12/21 119/76  12/13/20 (!) 144/84   The 10-year ASCVD risk score Katherine Bussing DC Jr., et al., 2013) is: 11.8%   Values used to calculate the score:     Age: 65 years     Sex: Female     Is Non-Hispanic African American: No     Diabetic: Yes     Tobacco smoker: No     Systolic Blood Pressure: 737 mmHg     Is BP treated: Yes     HDL Cholesterol: 49 mg/dL     Total Cholesterol: 161 mg/dL Lab Results  Component Value Date   CREATININE 0.58 12/14/2020   BUN 17 12/14/2020   NA 139 12/14/2020   K 4.3 12/14/2020   CL 100 12/14/2020   CO2 18 (L) 12/14/2020    Subjective:    Katherine Sanchez is a 65 y.o. female who presents to the office today for a preoperative consultation at the request of surgeon Dr. Paralee Cancel who plans on performing right total knee arthroplasty on September 26. Planned anesthesia: general. The patient has the following known anesthesia issues:  None . Patients bleeding risk: no recent abnormal bleeding. Patient does not have objections to receiving blood products if needed.  The following portions of the patient's history were reviewed and updated as appropriate: allergies, current medications, past family history, past medical history, past social history, past surgical history, and problem list.  Review of Systems A comprehensive review of systems was negative.    Objective:    BP 128/86   Pulse 71   Temp 98.3 F (36.8 C) (Oral)   Ht '5\' 2"'  (1.575 m)   Wt 211 lb (95.7 kg)   SpO2 96%   BMI 38.59 kg/m   General Appearance:    Alert, cooperative, no distress, appears stated age  Head:    Normocephalic, without obvious abnormality, atraumatic  Eyes:    PERRL, conjunctiva/corneas clear, EOM's intact  Ears:    Normal TM's and external ear canals, both ears  Nose:   Nares normal, septum midline, mucosa normal, no drainage    or sinus tenderness   Throat:   Lips, mucosa, and tongue normal; teeth and gums normal  Neck:   Supple, symmetrical, trachea midline, no adenopathy;    thyroid:  no enlargement/tenderness/nodules; no carotid   bruit or JVD  Back:     Symmetric, no curvature, ROM normal, no CVA tenderness  Lungs:     Clear to auscultation bilaterally, respirations unlabored  Chest Wall:    No tenderness or deformity   Heart:    Regular rate and rhythm, S1 and S2 normal, systolic murmur 3/6     Abdomen:     Soft, non-tender, bowel sounds active all four quadrants,    no masses, no organomegaly        Extremities:   Extremities normal, atraumatic, no cyanosis or edema  Pulses:   2+ and symmetric all extremities  Skin:   Skin color, texture, turgor normal, no rashes or lesions  Lymph nodes:   Cervical, supraclavicular, and axillary nodes normal  Neurologic:   CNII-XII intact, normal strength, sensation and reflexes    throughout    Predictors of intubation difficulty:  Morbid obesity? no  Anatomically abnormal facies? no  Prominent incisors? no  Receding mandible? no  Short, thick neck? no  Neck range of motion: normal  Cardiographics ECG: normal sinus rhythm, no blocks or conduction defects, no ischemic changes Echocardiogram: not done    Lab Review  No visits with results within 2 Month(s) from this visit.  Latest known visit with results is:  Admission on 12/13/2020, Discharged on 12/13/2020  Component Date Value   Color, UA 12/13/2020 yellow    Clarity, UA 12/13/2020 clear    Glucose, UA 12/13/2020 negative    Bilirubin, UA 12/13/2020 negative    Ketones, POC UA 12/13/2020 negative    Spec Grav, UA 12/13/2020 1.020    Blood, UA 12/13/2020 negative    pH, UA 12/13/2020 6.0    Protein Ur, POC 12/13/2020 negative    Urobilinogen, UA 12/13/2020 0.2    Nitrite, UA 12/13/2020 Negative    Leukocytes, UA 12/13/2020 Negative    Specimen Description 12/13/2020 URINE, RANDOM    Special Requests 12/13/2020                      Value:NONE Performed at [facility], [place] [address] [place], [place] [address]    Culture 12/13/2020 MULTIPLE SPECIES PRESENT, SUGGEST RECOLLECTION (A)   Report Status 12/13/2020 12/15/2020 FINAL    WBC 12/14/2020 10.4    RBC 12/14/2020 4.78    Hemoglobin 12/14/2020 13.3    Hematocrit 12/14/2020 42.1    MCV 12/14/2020 88    MCH 12/14/2020 27.8    MCHC 12/14/2020 31.6    RDW 12/14/2020 13.2    Platelets 12/14/2020 254    Neutrophils 12/14/2020 67    Lymphs 12/14/2020 20    Monocytes 12/14/2020 7    Eos 12/14/2020 5    Basos 12/14/2020 1    Neutrophils Absolute 12/14/2020 7.1 (A)   Lymphocytes Absolute 12/14/2020 2.0    Monocytes Absolute 12/14/2020 0.8    EOS (ABSOLUTE) 12/14/2020 0.5 (A)   Basophils Absolute 12/14/2020 0.1    Immature Granulocytes 12/14/2020 0    Immature Grans (Abs) 12/14/2020 0.0    Glucose 12/14/2020 89    BUN 12/14/2020 17    Creatinine, Ser 12/14/2020 0.58    eGFR 12/14/2020 101    BUN/Creatinine Ratio 12/14/2020 29 (A)   Sodium 12/14/2020 139    Potassium 12/14/2020 4.3    Chloride 12/14/2020 100    CO2 12/14/2020 18 (A)   Calcium 12/14/2020 9.6    Total Protein 12/14/2020 7.6    Albumin 12/14/2020 4.5    Globulin, Total 12/14/2020 3.1    Albumin/Globulin Ratio 12/14/2020 1.5    Bilirubin Total 12/14/2020 0.3    Alkaline Phosphatase 12/14/2020 115    AST 12/14/2020 28    ALT 12/14/2020 30    Lipase 12/14/2020 36       Assessment:      65 y.o. female with planned surgery as above.   Known risk factors for perioperative complications: Diabetes mellitus Hypertension    Difficulty with intubation is not anticipated. Katherine Sanchez was seen today for surgical clearance.  Diagnoses and all orders for this visit:  Preoperative examination -     EKG 12-Lead -     CBC with Differential/Platelet -     Comprehensive metabolic panel -     Hemoglobin A1c  Hypertension associated with type 2 diabetes mellitus (HCC) -     EKG  12-Lead -     CBC with Differential/Platelet -     Comprehensive metabolic panel -     Hemoglobin A1c  Primary osteoarthritis of both knees  OSA (obstructive sleep apnea)   Cardiac  Risk Estimation: Moderate risk  Current medications which may produce withdrawal symptoms if withheld perioperatively: none    Plan:    1. Preoperative workup as follows ECG, hemoglobin, hematocrit, electrolytes, creatinine. 2. Change in medication regimen before surgery: discontinue ASA 14 days before surgery. 3. Prophylaxis for cardiac events with perioperative beta-blockers: not indicated. 4. Invasive hemodynamic monitoring perioperatively: at the discretion of anesthesiologist. 5. Deep vein thrombosis prophylaxis postoperatively:regimen to be chosen by surgical team. 6. Surveillance for postoperative MI with ECG immediately postoperatively and on postoperative days 1 and 2 AND troponin levels 24 hours postoperatively and on day 4 or hospital discharge (whichever comes first): at the discretion of anesthesiologist. 7. Other measures:  None

## 2021-04-30 NOTE — Patient Instructions (Signed)
Health Maintenance, Female Adopting a healthy lifestyle and getting preventive care are important in promoting health and wellness. Ask your health care provider about: The right schedule for you to have regular tests and exams. Things you can do on your own to prevent diseases and keep yourself healthy. What should I know about diet, weight, and exercise? Eat a healthy diet  Eat a diet that includes plenty of vegetables, fruits, low-fat dairy products, and lean protein. Do not eat a lot of foods that are high in solid fats, added sugars, or sodium. Maintain a healthy weight Body mass index (BMI) is used to identify weight problems. It estimates body fat based on height and weight. Your health care provider can help determine your BMI and help you achieve or maintain a healthy weight. Get regular exercise Get regular exercise. This is one of the most important things you can do for your health. Most adults should: Exercise for at least 150 minutes each week. The exercise should increase your heart rate and make you sweat (moderate-intensity exercise). Do strengthening exercises at least twice a week. This is in addition to the moderate-intensity exercise. Spend less time sitting. Even light physical activity can be beneficial. Watch cholesterol and blood lipids Have your blood tested for lipids and cholesterol at 65 years of age, then have this test every 5 years. Have your cholesterol levels checked more often if: Your lipid or cholesterol levels are high. You are older than 65 years of age. You are at high risk for heart disease. What should I know about cancer screening? Depending on your health history and family history, you may need to have cancer screening at various ages. This may include screening for: Breast cancer. Cervical cancer. Colorectal cancer. Skin cancer. Lung cancer. What should I know about heart disease, diabetes, and high blood pressure? Blood pressure and heart  disease High blood pressure causes heart disease and increases the risk of stroke. This is more likely to develop in people who have high blood pressure readings, are of African descent, or are overweight. Have your blood pressure checked: Every 3-5 years if you are 18-39 years of age. Every year if you are 40 years old or older. Diabetes Have regular diabetes screenings. This checks your fasting blood sugar level. Have the screening done: Once every three years after age 40 if you are at a normal weight and have a low risk for diabetes. More often and at a younger age if you are overweight or have a high risk for diabetes. What should I know about preventing infection? Hepatitis B If you have a higher risk for hepatitis B, you should be screened for this virus. Talk with your health care provider to find out if you are at risk for hepatitis B infection. Hepatitis C Testing is recommended for: Everyone born from 1945 through 1965. Anyone with known risk factors for hepatitis C. Sexually transmitted infections (STIs) Get screened for STIs, including gonorrhea and chlamydia, if: You are sexually active and are younger than 65 years of age. You are older than 65 years of age and your health care provider tells you that you are at risk for this type of infection. Your sexual activity has changed since you were last screened, and you are at increased risk for chlamydia or gonorrhea. Ask your health care provider if you are at risk. Ask your health care provider about whether you are at high risk for HIV. Your health care provider may recommend a prescription medicine   to help prevent HIV infection. If you choose to take medicine to prevent HIV, you should first get tested for HIV. You should then be tested every 3 months for as long as you are taking the medicine. Pregnancy If you are about to stop having your period (premenopausal) and you may become pregnant, seek counseling before you get  pregnant. Take 400 to 800 micrograms (mcg) of folic acid every day if you become pregnant. Ask for birth control (contraception) if you want to prevent pregnancy. Osteoporosis and menopause Osteoporosis is a disease in which the bones lose minerals and strength with aging. This can result in bone fractures. If you are 65 years old or older, or if you are at risk for osteoporosis and fractures, ask your health care provider if you should: Be screened for bone loss. Take a calcium or vitamin D supplement to lower your risk of fractures. Be given hormone replacement therapy (HRT) to treat symptoms of menopause. Follow these instructions at home: Lifestyle Do not use any products that contain nicotine or tobacco, such as cigarettes, e-cigarettes, and chewing tobacco. If you need help quitting, ask your health care provider. Do not use street drugs. Do not share needles. Ask your health care provider for help if you need support or information about quitting drugs. Alcohol use Do not drink alcohol if: Your health care provider tells you not to drink. You are pregnant, may be pregnant, or are planning to become pregnant. If you drink alcohol: Limit how much you use to 0-1 drink a day. Limit intake if you are breastfeeding. Be aware of how much alcohol is in your drink. In the [place] one drink equals one 12 oz bottle of beer (355 mL), one 5 oz glass of wine (148 mL), or one 1 oz glass of hard liquor (44 mL). General instructions Schedule regular health, dental, and eye exams. Stay current with your vaccines. Tell your health care provider if: You often feel depressed. You have ever been abused or do not feel safe at home. Summary Adopting a healthy lifestyle and getting preventive care are important in promoting health and wellness. Follow your health care provider's instructions about healthy diet, exercising, and getting tested or screened for diseases. Follow your health care provider's  instructions on monitoring your cholesterol and blood pressure. This information is not intended to replace advice given to you by your health care provider. Make sure you discuss any questions you have with your health care provider. Document Revised: 10/18/2020 Document Reviewed: 08/03/2018 Elsevier Patient Education  2022 Elsevier Inc.  

## 2021-05-02 DIAGNOSIS — M1711 Unilateral primary osteoarthritis, right knee: Secondary | ICD-10-CM | POA: Diagnosis not present

## 2021-05-18 ENCOUNTER — Other Ambulatory Visit: Payer: Self-pay | Admitting: Emergency Medicine

## 2021-05-18 DIAGNOSIS — I10 Essential (primary) hypertension: Secondary | ICD-10-CM

## 2021-05-19 DIAGNOSIS — M1711 Unilateral primary osteoarthritis, right knee: Secondary | ICD-10-CM | POA: Diagnosis not present

## 2021-05-19 DIAGNOSIS — G8918 Other acute postprocedural pain: Secondary | ICD-10-CM | POA: Diagnosis not present

## 2021-05-22 DIAGNOSIS — M25561 Pain in right knee: Secondary | ICD-10-CM | POA: Diagnosis not present

## 2021-05-26 DIAGNOSIS — G4733 Obstructive sleep apnea (adult) (pediatric): Secondary | ICD-10-CM | POA: Diagnosis not present

## 2021-05-29 DIAGNOSIS — M25561 Pain in right knee: Secondary | ICD-10-CM | POA: Diagnosis not present

## 2021-06-04 DIAGNOSIS — M25561 Pain in right knee: Secondary | ICD-10-CM | POA: Diagnosis not present

## 2021-06-06 DIAGNOSIS — M25561 Pain in right knee: Secondary | ICD-10-CM | POA: Diagnosis not present

## 2021-06-10 DIAGNOSIS — M25561 Pain in right knee: Secondary | ICD-10-CM | POA: Diagnosis not present

## 2021-06-12 DIAGNOSIS — M25561 Pain in right knee: Secondary | ICD-10-CM | POA: Diagnosis not present

## 2021-06-15 ENCOUNTER — Other Ambulatory Visit: Payer: Self-pay | Admitting: Emergency Medicine

## 2021-06-15 DIAGNOSIS — E119 Type 2 diabetes mellitus without complications: Secondary | ICD-10-CM

## 2021-06-15 DIAGNOSIS — Z794 Long term (current) use of insulin: Secondary | ICD-10-CM

## 2021-06-17 DIAGNOSIS — M25561 Pain in right knee: Secondary | ICD-10-CM | POA: Diagnosis not present

## 2021-06-20 DIAGNOSIS — M25561 Pain in right knee: Secondary | ICD-10-CM | POA: Diagnosis not present

## 2021-06-23 DIAGNOSIS — M25561 Pain in right knee: Secondary | ICD-10-CM | POA: Diagnosis not present

## 2021-06-26 DIAGNOSIS — M25561 Pain in right knee: Secondary | ICD-10-CM | POA: Diagnosis not present

## 2021-06-26 DIAGNOSIS — G4733 Obstructive sleep apnea (adult) (pediatric): Secondary | ICD-10-CM | POA: Diagnosis not present

## 2021-06-30 DIAGNOSIS — M25561 Pain in right knee: Secondary | ICD-10-CM | POA: Diagnosis not present

## 2021-07-01 DIAGNOSIS — G4733 Obstructive sleep apnea (adult) (pediatric): Secondary | ICD-10-CM | POA: Diagnosis not present

## 2021-07-04 DIAGNOSIS — Z96651 Presence of right artificial knee joint: Secondary | ICD-10-CM | POA: Diagnosis not present

## 2021-07-04 DIAGNOSIS — M25561 Pain in right knee: Secondary | ICD-10-CM | POA: Diagnosis not present

## 2021-07-04 DIAGNOSIS — Z471 Aftercare following joint replacement surgery: Secondary | ICD-10-CM | POA: Diagnosis not present

## 2021-07-06 ENCOUNTER — Other Ambulatory Visit: Payer: Self-pay | Admitting: Emergency Medicine

## 2021-07-06 DIAGNOSIS — E039 Hypothyroidism, unspecified: Secondary | ICD-10-CM

## 2021-07-11 DIAGNOSIS — M25561 Pain in right knee: Secondary | ICD-10-CM | POA: Diagnosis not present

## 2021-07-21 DIAGNOSIS — M25561 Pain in right knee: Secondary | ICD-10-CM | POA: Diagnosis not present

## 2021-07-24 DIAGNOSIS — M25561 Pain in right knee: Secondary | ICD-10-CM | POA: Diagnosis not present

## 2021-07-28 DIAGNOSIS — M25561 Pain in right knee: Secondary | ICD-10-CM | POA: Diagnosis not present

## 2021-07-30 DIAGNOSIS — M25561 Pain in right knee: Secondary | ICD-10-CM | POA: Diagnosis not present

## 2021-07-31 DIAGNOSIS — G4733 Obstructive sleep apnea (adult) (pediatric): Secondary | ICD-10-CM | POA: Diagnosis not present

## 2021-08-22 DIAGNOSIS — S8001XA Contusion of right knee, initial encounter: Secondary | ICD-10-CM | POA: Diagnosis not present

## 2021-08-24 HISTORY — PX: REPLACEMENT TOTAL KNEE: SUR1224

## 2021-08-31 DIAGNOSIS — G4733 Obstructive sleep apnea (adult) (pediatric): Secondary | ICD-10-CM | POA: Diagnosis not present

## 2021-09-26 ENCOUNTER — Other Ambulatory Visit: Payer: Self-pay | Admitting: Emergency Medicine

## 2021-10-01 DIAGNOSIS — Z20828 Contact with and (suspected) exposure to other viral communicable diseases: Secondary | ICD-10-CM | POA: Diagnosis not present

## 2021-10-01 DIAGNOSIS — R69 Illness, unspecified: Secondary | ICD-10-CM | POA: Diagnosis not present

## 2021-10-09 ENCOUNTER — Ambulatory Visit
Admission: EM | Admit: 2021-10-09 | Discharge: 2021-10-09 | Disposition: A | Discharge status: Home / Self Care | Payer: Federal, State, Local not specified - PPO | Attending: Internal Medicine | Admitting: Internal Medicine

## 2021-10-09 ENCOUNTER — Other Ambulatory Visit: Payer: Self-pay

## 2021-10-09 DIAGNOSIS — J209 Acute bronchitis, unspecified: Secondary | ICD-10-CM | POA: Diagnosis not present

## 2021-10-09 MED ORDER — GUAIFENESIN ER 600 MG PO TB12: 600.0000 mg | ORAL_TABLET | Freq: Two times a day (BID) | ORAL | 0 refills | Status: AC

## 2021-10-09 MED ORDER — PREDNISONE 20 MG PO TABS: 20.0000 mg | ORAL_TABLET | Freq: Every day | ORAL | 0 refills | Status: AC

## 2021-10-09 MED ORDER — IBUPROFEN 600 MG PO TABS: 600.0000 mg | ORAL_TABLET | Freq: Three times a day (TID) | ORAL | 0 refills | Status: AC | PRN

## 2021-10-09 MED ORDER — ALBUTEROL SULFATE HFA 108 (90 BASE) MCG/ACT IN AERS: 1.0000 | INHALATION_SPRAY | Freq: Four times a day (QID) | RESPIRATORY_TRACT | 0 refills | Status: AC | PRN

## 2021-10-09 NOTE — Discharge Instructions (Addendum)
Please take medications as prescribed Humidifier use will help with coughing and nasal congestion Vapor rub use will help with nasal congestion and coughing If you have worsening shortness of breath, chest pain please return to urgent care to be reevaluated.

## 2021-10-09 NOTE — ED Triage Notes (Signed)
Pt reports her son tested positive for the flu last week. She reports now feeling congestion in her lungs, she states she does have pain to her throat and has had a fever.

## 2021-10-10 NOTE — ED Provider Notes (Signed)
[facility]    CSN: [id number] Arrival date & time: 10/09/21  1439      History   Chief Complaint Chief Complaint  Patient presents with   Cough   Generalized Body Aches   Fever    HPI Katherine Sanchez is a 66 y.o. female comes to the urgent care with persistent cough, sore throat and subjective fever.  Patient was started on Tamiflu last week for exposure to confirmed influenza case.  She completed the course of Tamiflu.  She has a cough which is now productive of sputum.  She endorses chest tightness and subjective fever.  No shortness of breath or wheezing.  Patient says the cough is worse at night and is interfering with her sleep apartment.  No vomiting or diarrhea.  Oral intake is at baseline.Marland Kitchen   HPI  Past Medical History:  Diagnosis Date   Abdominal distension    Allergy    per pt   Anxiety    Arthritis    knees    Constipation    Diabetes mellitus without complication (HCC)    GERD (gastroesophageal reflux disease)    Heart murmur    Hyperlipidemia    Hypertension    Hypothyroidism    Incisional hernia    Pneumonia    hx of 2006    Seasonal allergies    Sleep apnea    cpap x 10 yrs Dr [name]    Thyroid disease     Patient Active Problem List   Diagnosis Date Noted   Class 2 severe obesity due to excess calories with serious comorbidity and body mass index (BMI) of 38.0 to 38.9 in adult ([place]) 03/26/2020   Statin intolerance 03/26/2020   Vitamin B12 deficiency 02/22/2018   Type 2 diabetes mellitus without complication, with long-term current use of insulin ([place]) 06/22/2017   Acquired hypothyroidism 06/22/2017   OSA (obstructive sleep apnea) 06/28/2013   Obesity (BMI 30-39.9) 07/08/2011   Essential hypertension, benign 10/17/2007   Chronic ischemic heart disease 10/17/2007   Hypertension associated with type 2 diabetes mellitus ([place]) 10/14/2007   Recurrent ventral incisional hernia, periumbilical 22/48/2500   Osteoarthritis 10/14/2007    ABNORMAL GLUCOSE NEC 10/14/2007    Past Surgical History:  Procedure Laterality Date   [place] and negative   CHOLECYSTECTOMY  2010   lap with open redo umbilical hernia repair.  Dr. [name]   COLONOSCOPY  11/18/2016   HERNIA REPAIR  3704   umbilical open w mesh plug. Dr. [name] and 2010 Dr [name]    KNEE SURGERY  2006   left - arthroscopic   VENTRAL HERNIA REPAIR  08/13/2011   Procedure: LAPAROSCOPIC VENTRAL HERNIA;  Surgeon: Adin Hector, MD;  Location: [facility];  Service: General;  Laterality: N/A;  Laparoscopic Ventral Hernia Repair with Mesh    OB History   No obstetric history on file.      Home Medications    Prior to Admission medications   Medication Sig Start Date End Date Taking? Authorizing Provider  albuterol (VENTOLIN HFA) 108 (90 Base) MCG/ACT inhaler Inhale 1-2 puffs into the lungs every 6 (six) hours as needed for wheezing or shortness of breath. 10/09/21  Yes Samyia Motter, Myrene Galas, MD  guaiFENesin (MUCINEX) 600 MG 12 hr tablet Take 1 tablet (600 mg total) by mouth 2 (two) times daily for 10 days. 10/09/21 10/19/21 Yes Safiyah Cisney, Myrene Galas, MD  ibuprofen (ADVIL) 600 MG tablet Take  1 tablet (600 mg total) by mouth every 8 (eight) hours as needed. 10/09/21  Yes Randen Kauth, Myrene Galas, MD  predniSONE (DELTASONE) 20 MG tablet Take 1 tablet (20 mg total) by mouth daily for 5 days. 10/09/21 10/14/21 Yes Ayala Ribble, Myrene Galas, MD  amLODipine-benazepril (LOTREL) 10-20 MG capsule TAKE 1 CAPSULE BY MOUTH EVERY DAY 05/18/21   Horald Pollen, MD  Ascorbic Acid (VITAMIN C) POWD Take 1,000 mg by mouth daily.    [provider]  aspirin 81 MG tablet Take 81 mg by mouth daily.    [provider]  blood glucose meter kit and supplies KIT Dispense based on patient and insurance preference. Use up to one time as directed  (FOR ICD-9 250.00, 250.01). 03/24/15   Le, [name], DO  docusate sodium (COLACE) 100 MG capsule Take 100 mg by  mouth 2 (two) times daily as needed.    [provider]  fluticasone (FLONASE) 50 MCG/ACT nasal spray SPRAY 1 SPRAY INTO BOTH NOSTRILS DAILY. 09/26/21   Horald Pollen, MD  glucose blood (ONE TOUCH ULTRA TEST) test strip USE UP TO 1 TIME AS DIRECTED 04/27/19   Horald Pollen, MD  levothyroxine (SYNTHROID) 50 MCG tablet TAKE 1 TABLET BY MOUTH EVERY DAY 07/06/21   Horald Pollen, MD  meloxicam (MOBIC) 15 MG tablet Take 1 tablet (15 mg total) by mouth daily as needed for pain. 08/05/16   Shawnee Knapp, MD  metFORMIN (GLUCOPHAGE) 1000 MG tablet Take 1 tablet (1,000 mg total) by mouth 2 (two) times daily with a meal. 01/29/21   Horald Pollen, MD  ondansetron (ZOFRAN ODT) 4 MG disintegrating tablet Take 1 tablet (4 mg total) by mouth every 8 (eight) hours as needed for nausea or vomiting. 12/13/20   Volney American, PA-C  OneTouch Delica Lancets 28Z MISC USE UP TO 1 TIME AS DIRECTED 03/26/20   Horald Pollen, MD  TRULICITY 6.62 HU/7.6LY SOPN INJECT 0.75 MG INTO THE SKIN ONCE A WEEK 06/15/21   Horald Pollen, MD    Family History Family History  Problem Relation Age of Onset   Cancer Mother        colon   Heart disease Mother 13       congestive heart failure   COPD Mother    Colon cancer Mother 68   Other Father 6       car accident   Cancer Maternal Grandmother        ovarian   Asthma Son    Allergies Son    Hypertension Paternal Grandmother    Cancer Paternal Grandfather        prostate   Breast cancer Neg Hx    Colon polyps Neg Hx    Esophageal cancer Neg Hx    Rectal cancer Neg Hx    Stomach cancer Neg Hx     Social History Social History   Tobacco Use   Smoking status: Former    Packs/day: 0.10    Years: 4.00    Pack years: 0.40    Types: Cigarettes    Quit date: 07/07/1978    Years since quitting: 43.2   Smokeless tobacco: Never   Tobacco comments:    occassional smoker with alcohol consumption  Vaping Use   Vaping  Use: Never used  Substance Use Topics   Alcohol use: No    Comment: quit 1979   Drug use: No     Allergies   Amoxicillin   Review  of Systems Review of Systems  Constitutional: Negative.   HENT:  Positive for congestion, rhinorrhea and sore throat.   Respiratory:  Positive for cough. Negative for shortness of breath and wheezing.   Cardiovascular:  Negative for chest pain.  Gastrointestinal: Negative.   Neurological: Negative.     Physical Exam Triage Vital Signs ED Triage Vitals  Enc Vitals Group     BP 10/09/21 1534 (!) 145/83     Pulse Rate 10/09/21 1534 82     Resp 10/09/21 1534 18     Temp 10/09/21 1534 99.7 F (37.6 C)     Temp Source 10/09/21 1534 Oral     SpO2 10/09/21 1534 95 %     Weight --      Height --      Head Circumference --      Peak Flow --      Pain Score 10/09/21 1532 7     Pain Loc --      Pain Edu? --      Excl. in [place]? --    No data found.  Updated Vital Signs BP (!) 145/83 (BP Location: Right Arm)    Pulse 82    Temp 99.7 F (37.6 C) (Oral)    Resp 18    SpO2 95%   Visual Acuity Right Eye Distance:   Left Eye Distance:   Bilateral Distance:    Right Eye Near:   Left Eye Near:    Bilateral Near:     Physical Exam Vitals and nursing note reviewed.  Constitutional:      General: She is not in acute distress.    Appearance: She is ill-appearing.  HENT:     Right Ear: Tympanic membrane normal.     Left Ear: Tympanic membrane normal.  Cardiovascular:     Rate and Rhythm: Normal rate and regular rhythm.     Pulses: Normal pulses.     Heart sounds: Normal heart sounds.  Pulmonary:     Effort: Pulmonary effort is normal.     Breath sounds: Wheezing present. No rhonchi or rales.  Abdominal:     General: Bowel sounds are normal.     Palpations: Abdomen is soft.  Musculoskeletal:        General: Normal range of motion.  Neurological:     Mental Status: She is alert.     UC Treatments / Results  Labs (all labs ordered are  listed, but only abnormal results are displayed) Labs Reviewed - No data to display  EKG   Radiology No results found.  Procedures Procedures (including critical care time)  Medications Ordered in UC Medications - No data to display  Initial Impression / Assessment and Plan / UC Course  I have reviewed the triage vital signs and the nursing notes.  Pertinent labs & imaging results that were available during my care of the patient were reviewed by me and considered in my medical decision making (see chart for details).     1.  Acute bronchitis with bronchospasm: Albuterol inhaler Prednisone 20 mg orally daily for 5 days Mucinex twice daily Ibuprofen 600 mg every 6 hours as needed for pain Humidifier and VapoRub use recommended Return precautions given Final Clinical Impressions(s) / UC Diagnoses   Final diagnoses:  Acute bronchitis with bronchospasm     Discharge Instructions      Please take medications as prescribed Humidifier use will help with coughing and nasal congestion Vapor rub use will help with nasal congestion  and coughing If you have worsening shortness of breath, chest pain please return to urgent care to be reevaluated.   ED Prescriptions     Medication Sig Dispense Auth. Provider   guaiFENesin (MUCINEX) 600 MG 12 hr tablet Take 1 tablet (600 mg total) by mouth 2 (two) times daily for 10 days. 20 tablet Laporche Martelle, Myrene Galas, MD   albuterol (VENTOLIN HFA) 108 (90 Base) MCG/ACT inhaler Inhale 1-2 puffs into the lungs every 6 (six) hours as needed for wheezing or shortness of breath. 18 g Cash Duce, Myrene Galas, MD   predniSONE (DELTASONE) 20 MG tablet Take 1 tablet (20 mg total) by mouth daily for 5 days. 5 tablet Sevan Mcbroom, Myrene Galas, MD   ibuprofen (ADVIL) 600 MG tablet Take 1 tablet (600 mg total) by mouth every 8 (eight) hours as needed. 21 tablet Auden Tatar, Myrene Galas, MD      PDMP not reviewed this encounter.   Chase Picket, MD 10/10/21 1745

## 2021-12-26 ENCOUNTER — Other Ambulatory Visit: Payer: Self-pay | Admitting: Emergency Medicine

## 2021-12-26 DIAGNOSIS — E119 Type 2 diabetes mellitus without complications: Secondary | ICD-10-CM

## 2022-01-13 DIAGNOSIS — G4733 Obstructive sleep apnea (adult) (pediatric): Secondary | ICD-10-CM | POA: Diagnosis not present

## 2022-02-02 ENCOUNTER — Other Ambulatory Visit: Payer: Self-pay | Admitting: Emergency Medicine

## 2022-02-02 DIAGNOSIS — E039 Hypothyroidism, unspecified: Secondary | ICD-10-CM

## 2022-02-13 DIAGNOSIS — G4733 Obstructive sleep apnea (adult) (pediatric): Secondary | ICD-10-CM | POA: Diagnosis not present

## 2022-02-27 ENCOUNTER — Other Ambulatory Visit: Payer: Self-pay | Admitting: Emergency Medicine

## 2022-02-27 DIAGNOSIS — E039 Hypothyroidism, unspecified: Secondary | ICD-10-CM

## 2022-03-15 DIAGNOSIS — G4733 Obstructive sleep apnea (adult) (pediatric): Secondary | ICD-10-CM | POA: Diagnosis not present

## 2022-03-18 ENCOUNTER — Ambulatory Visit: Payer: Federal, State, Local not specified - PPO | Admitting: Family Medicine

## 2022-03-18 ENCOUNTER — Encounter: Payer: Self-pay | Admitting: Family Medicine

## 2022-03-18 VITALS — BP 133/78 | HR 69 | Ht 62.0 in | Wt 216.0 lb

## 2022-03-18 DIAGNOSIS — G4733 Obstructive sleep apnea (adult) (pediatric): Secondary | ICD-10-CM | POA: Diagnosis not present

## 2022-03-18 NOTE — Patient Instructions (Signed)

## 2022-03-18 NOTE — Progress Notes (Signed)
PATIENT: Katherine Sanchez DOB: [birth year]  REASON FOR VISIT: follow up HISTORY FROM: patient PRIMARY NEUROLOGIST: Dr. Rexene Alberts  HISTORY OF PRESENT ILLNESS:  03/18/22 ALL: Katherine Sanchez returns for follow up for OSA on CPAP. She continues to do well on therapy. She is using her machine nightly for about 8 hours. She does not benefit of using CPAP. She has noticed a leak in her mask. She reports needing to change mask and headgear. She will monitor at home.      03/12/2021 MM: Katherine Sanchez is a 66 year old female with a history of obstructive sleep apnea on CPAP.  She returns today for follow-up.  She reports that the CPAP is working well.  She really enjoys her new CPAP.  She states occasionally during the night that pressure may feel too high but she does not want to adjust it at this time.  She returns today for an evaluation.      REVIEW OF SYSTEMS: Out of a complete 14 system review of symptoms, the patient complains only of the following symptoms, and all other reviewed systems are negative.   ESS 7  ALLERGIES: Allergies  Allergen Reactions   Amoxicillin     hives hives hives    HOME MEDICATIONS: Outpatient Medications Prior to Visit  Medication Sig Dispense Refill   albuterol (VENTOLIN HFA) 108 (90 Base) MCG/ACT inhaler Inhale 1-2 puffs into the lungs every 6 (six) hours as needed for wheezing or shortness of breath. 18 g 0   amLODipine-benazepril (LOTREL) 10-20 MG capsule TAKE 1 CAPSULE BY MOUTH EVERY DAY 90 capsule 3   Ascorbic Acid (VITAMIN C) POWD Take 1,000 mg by mouth daily.     aspirin 81 MG tablet Take 81 mg by mouth daily.     blood glucose meter kit and supplies KIT Dispense based on patient and insurance preference. Use up to one time as directed  (FOR ICD-9 250.00, 250.01). 1 each 0   docusate sodium (COLACE) 100 MG capsule Take 100 mg by mouth 2 (two) times daily as needed.     fluticasone (FLONASE) 50 MCG/ACT nasal spray SPRAY 1 SPRAY INTO BOTH NOSTRILS  DAILY. 16 mL 2   glucose blood (ONE TOUCH ULTRA TEST) test strip USE UP TO 1 TIME AS DIRECTED 25 each 3   ibuprofen (ADVIL) 600 MG tablet Take 1 tablet (600 mg total) by mouth every 8 (eight) hours as needed. 21 tablet 0   levothyroxine (SYNTHROID) 50 MCG tablet TAKE 1 TABLET BY MOUTH EVERY DAY 30 tablet 0   meloxicam (MOBIC) 15 MG tablet Take 1 tablet (15 mg total) by mouth daily as needed for pain. 90 tablet 0   metFORMIN (GLUCOPHAGE) 1000 MG tablet Take 1 tablet (1,000 mg total) by mouth 2 (two) times daily with a meal. 180 tablet 3   ondansetron (ZOFRAN ODT) 4 MG disintegrating tablet Take 1 tablet (4 mg total) by mouth every 8 (eight) hours as needed for nausea or vomiting. 20 tablet 0   OneTouch Delica Lancets 58K MISC USE UP TO 1 TIME AS DIRECTED 998 each 11   TRULICITY 3.38 SN/0.5LZ SOPN INJECT 0.75 MG INTO THE SKIN ONCE A WEEK 6 mL 6   No facility-administered medications prior to visit.    PAST MEDICAL HISTORY: Past Medical History:  Diagnosis Date   Abdominal distension    Allergy    per pt   Anxiety    Arthritis    knees    Constipation    Diabetes mellitus  without complication (HCC)    GERD (gastroesophageal reflux disease)    Heart murmur    Hyperlipidemia    Hypertension    Hypothyroidism    Incisional hernia    Pneumonia    hx of 2006    Seasonal allergies    Sleep apnea    cpap x 10 yrs Dr [name]    Thyroid disease     PAST SURGICAL HISTORY: Past Surgical History:  Procedure Laterality Date   [place] and negative   CHOLECYSTECTOMY  2010   lap with open redo umbilical hernia repair.  Dr. [name]   COLONOSCOPY  11/18/2016   HERNIA REPAIR  [id number]   umbilical open w mesh plug. Dr. [name] and 2010 Dr [name]    KNEE SURGERY  2006   left - arthroscopic   VENTRAL HERNIA REPAIR  08/13/2011   Procedure: LAPAROSCOPIC VENTRAL HERNIA;  Surgeon: Adin Hector, MD;  Location: [facility];  Service: General;   Laterality: N/A;  Laparoscopic Ventral Hernia Repair with Mesh    FAMILY HISTORY: Family History  Problem Relation Age of Onset   Cancer Mother        colon   Heart disease Mother 45       congestive heart failure   COPD Mother    Colon cancer Mother 61   Other Father 29       car accident   Cancer Maternal Grandmother        ovarian   Asthma Son    Allergies Son    Hypertension Paternal Grandmother    Cancer Paternal Grandfather        prostate   Breast cancer Neg Hx    Colon polyps Neg Hx    Esophageal cancer Neg Hx    Rectal cancer Neg Hx    Stomach cancer Neg Hx     SOCIAL HISTORY: Social History   Socioeconomic History   Marital status: Married    Spouse name: Not on file   Number of children: 2   Years of education: Not on file   Highest education level: Not on file  Occupational History   Occupation: Social research officer, 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   Occupation: care taker  Tobacco Use   Smoking status: Former    Packs/day: 0.10    Years: 4.00    Total pack years: 0.40    Types: Cigarettes    Quit date: 07/07/1978    Years since quitting: 43.7   Smokeless tobacco: Never   Tobacco comments:    occassional smoker with alcohol consumption  Vaping Use   Vaping Use: Never used  Substance and Sexual Activity   Alcohol use: No    Comment: quit 1979   Drug use: No   Sexual activity: Yes    Birth control/protection: None    Comment: number of sex partners in the last 72 months 1  Other Topics Concern   Not on file  Social History Narrative   Lives with husband and his mother is currently living with them after sustaining a fall in Oct. 2013.   Exercise walking 2 times daily   Social Determinants of Health   Financial Resource Strain: Not on file  Food Insecurity: Not on file  Transportation Needs: Not on file  Physical Activity: Not on file  Stress: Not on file  Social Connections: Not on file  Intimate Partner Violence: Not on file      PHYSICAL  EXAM  Vitals:   03/18/22 1016  BP: 133/78  Pulse: 69  Weight: 216 lb (98 kg)  Height: 5' 2" (1.575 m)    Body mass index is 39.51 kg/m.  Generalized: Well developed, in no acute distress  Chest: Lungs clear to auscultation bilaterally  Neurological examination  Mentation: Alert oriented to time, place, history taking. Follows all commands speech and language fluent Cranial nerve II-XII: Extraocular movements were full, visual field were full on confrontational test Head turning and shoulder shrug  were normal and symmetric. Motor: The motor testing reveals 5 over 5 strength of all 4 extremities. Good symmetric motor tone is noted throughout.  Sensory: Sensory testing is intact to soft touch on all 4 extremities. No evidence of extinction is noted.  Gait and station: Gait is normal.    DIAGNOSTIC DATA (LABS, IMAGING, TESTING) - I reviewed patient records, labs, notes, testing and imaging myself where available.  Lab Results  Component Value Date   WBC 6.5 04/30/2021   HGB 13.5 04/30/2021   HCT 41.0 04/30/2021   MCV 85.9 04/30/2021   PLT 268.0 04/30/2021      Component Value Date/Time   NA 137 04/30/2021 1431   NA 139 12/14/2020 1937   K 3.7 04/30/2021 1431   CL 104 04/30/2021 1431   CO2 23 04/30/2021 1431   GLUCOSE 91 04/30/2021 1431   BUN 13 04/30/2021 1431   BUN 17 12/14/2020 1937   CREATININE 0.51 04/30/2021 1431   CREATININE 0.69 06/12/2016 1311   CALCIUM 9.7 04/30/2021 1431   PROT 7.3 04/30/2021 1431   PROT 7.6 12/14/2020 1937   ALBUMIN 4.2 04/30/2021 1431   ALBUMIN 4.5 12/14/2020 1937   AST 28 04/30/2021 1431   ALT 35 04/30/2021 1431   ALKPHOS 84 04/30/2021 1431   BILITOT 0.4 04/30/2021 1431   BILITOT 0.3 12/14/2020 1937   GFRNONAA 96 08/29/2020 1556   GFRNONAA >89 03/24/2015 1125   GFRAA 111 08/29/2020 1556   GFRAA >89 03/24/2015 1125   Lab Results  Component Value Date   CHOL 161 09/26/2019   HDL 49 09/26/2019   LDLCALC 90 09/26/2019   TRIG  124 09/26/2019   CHOLHDL 3.3 09/26/2019   Lab Results  Component Value Date   HGBA1C 5.9 04/30/2021   Lab Results  Component Value Date   VITAMINB12 181 (L) 02/22/2018   Lab Results  Component Value Date   TSH 2.740 02/22/2018      ASSESSMENT AND PLAN 66 y.o. year old female  has a past medical history of Abdominal distension, Allergy, Anxiety, Arthritis, Constipation, Diabetes mellitus without complication ([place]), GERD (gastroesophageal reflux disease), Heart murmur, Hyperlipidemia, Hypertension, Hypothyroidism, Incisional hernia, Pneumonia, Seasonal allergies, Sleep apnea, and Thyroid disease. here with:  OSA on CPAP  - CPAP compliance excellent - Good treatment of AHI  - continue to monitor leak at home  - Encourage patient to use CPAP nightly and > 4 hours each night - F/U in 1 year or sooner if needed    Debbora Presto, MSN, FNP-C 03/18/2022, 10:45 AM [facility] [address] [place], [place] [address] [phone]

## 2022-03-18 NOTE — Progress Notes (Signed)
CM sent to [facility] for new order ?

## 2022-03-25 ENCOUNTER — Other Ambulatory Visit: Payer: Self-pay | Admitting: Emergency Medicine

## 2022-03-25 DIAGNOSIS — E039 Hypothyroidism, unspecified: Secondary | ICD-10-CM

## 2022-03-25 DIAGNOSIS — K080 Exfoliation of teeth due to systemic causes: Secondary | ICD-10-CM | POA: Diagnosis not present

## 2022-04-01 DIAGNOSIS — G4733 Obstructive sleep apnea (adult) (pediatric): Secondary | ICD-10-CM | POA: Diagnosis not present

## 2022-04-21 ENCOUNTER — Other Ambulatory Visit: Payer: Self-pay | Admitting: Emergency Medicine

## 2022-04-21 DIAGNOSIS — E039 Hypothyroidism, unspecified: Secondary | ICD-10-CM

## 2022-04-22 ENCOUNTER — Other Ambulatory Visit: Payer: Self-pay | Admitting: Emergency Medicine

## 2022-04-22 DIAGNOSIS — I10 Essential (primary) hypertension: Secondary | ICD-10-CM

## 2022-05-02 DIAGNOSIS — G4733 Obstructive sleep apnea (adult) (pediatric): Secondary | ICD-10-CM | POA: Diagnosis not present

## 2022-05-21 ENCOUNTER — Other Ambulatory Visit: Payer: Self-pay | Admitting: Emergency Medicine

## 2022-05-21 DIAGNOSIS — E039 Hypothyroidism, unspecified: Secondary | ICD-10-CM

## 2022-06-11 DIAGNOSIS — G4733 Obstructive sleep apnea (adult) (pediatric): Secondary | ICD-10-CM | POA: Diagnosis not present

## 2022-06-18 ENCOUNTER — Other Ambulatory Visit: Payer: Self-pay | Admitting: Emergency Medicine

## 2022-06-18 DIAGNOSIS — E119 Type 2 diabetes mellitus without complications: Secondary | ICD-10-CM

## 2022-06-21 ENCOUNTER — Other Ambulatory Visit: Payer: Self-pay

## 2022-06-21 ENCOUNTER — Encounter: Payer: Self-pay | Admitting: Emergency Medicine

## 2022-06-21 ENCOUNTER — Ambulatory Visit
Admission: EM | Admit: 2022-06-21 | Discharge: 2022-06-21 | Disposition: A | Discharge status: Home / Self Care | Payer: Federal, State, Local not specified - PPO | Attending: Emergency Medicine | Admitting: Emergency Medicine

## 2022-06-21 DIAGNOSIS — R509 Fever, unspecified: Secondary | ICD-10-CM | POA: Diagnosis not present

## 2022-06-21 DIAGNOSIS — Z1152 Encounter for screening for COVID-19: Secondary | ICD-10-CM | POA: Diagnosis not present

## 2022-06-21 LAB — POCT URINALYSIS DIP (MANUAL ENTRY)
Bilirubin, UA: NEGATIVE
Blood, UA: NEGATIVE
Glucose, UA: NEGATIVE mg/dL
Ketones, POC UA: NEGATIVE mg/dL
Leukocytes, UA: NEGATIVE
Nitrite, UA: NEGATIVE
Protein Ur, POC: NEGATIVE mg/dL
Spec Grav, UA: 1.01 (ref 1.010–1.025)
Urobilinogen, UA: 0.2 E.U./dL
pH, UA: 7 (ref 5.0–8.0)

## 2022-06-21 NOTE — ED Triage Notes (Signed)
Pt sts intermittent fever x 2 days; denies other sx sts took covid test yesterday was negative

## 2022-06-21 NOTE — Discharge Instructions (Addendum)
Your urinalysis today did not reveal any concerns for urinary tract infection.  We repeated your COVID-19 test and the results should be available to your MyChart tomorrow evening.  If there is a positive result, you will be contacted by phone.  Please let the nurse know if you are interested in starting Paxlovid for treatment for COVID-19 if your test is positive.  Please continue to monitor for fever.  If your fever persists for more than 2 more days, I recommend that you go to the emergency room for further evaluation.  Thank you for visiting urgent care today.

## 2022-06-21 NOTE — ED Provider Notes (Signed)
[facility]    CSN: [id number] Arrival date & time: 06/21/22  1414    HISTORY   Chief Complaint  Patient presents with   Fever   HPI Katherine Sanchez is a pleasant, 66 y.o. female who presents to urgent care today. Pt sts intermittent fever x 2 days; denies other sx sts took COVID-19 test yesterday which was negative.  Patient is requesting repeat testing today.  Patient states Tmax 103 taken orally but again states fever has been intermittent and is not febrile at this time.  Patient has elevated blood pressure on arrival today.  Patient denies known sick contacts.  Patient denies symptoms of upper respiratory infection including nasal congestion, runny nose, cough, sore throat, ear pain, hoarseness of voice.  Patient denies nausea, vomiting, diarrhea, abdominal pain or pain otherwise.  Patient states has been taking Tylenol as needed for fever, last dose was yesterday.  The history is provided by the patient.   Past Medical History:  Diagnosis Date   Abdominal distension    Allergy    per pt   Anxiety    Arthritis    knees    Constipation    Diabetes mellitus without complication (HCC)    GERD (gastroesophageal reflux disease)    Heart murmur    Hyperlipidemia    Hypertension    Hypothyroidism    Incisional hernia    Pneumonia    hx of 2006    Seasonal allergies    Sleep apnea    cpap x 10 yrs Dr [name]    Thyroid disease    Patient Active Problem List   Diagnosis Date Noted   Class 2 severe obesity due to excess calories with serious comorbidity and body mass index (BMI) of 38.0 to 38.9 in adult ([place]) 03/26/2020   Statin intolerance 03/26/2020   Vitamin B12 deficiency 02/22/2018   Type 2 diabetes mellitus without complication, with long-term current use of insulin ([place]) 06/22/2017   Acquired hypothyroidism 06/22/2017   OSA (obstructive sleep apnea) 06/28/2013   Obesity (BMI 30-39.9) 07/08/2011   Essential hypertension, benign 10/17/2007   Chronic  ischemic heart disease 10/17/2007   Hypertension associated with type 2 diabetes mellitus ([place]) 10/14/2007   Recurrent ventral incisional hernia, periumbilical 04/88/8916   Osteoarthritis 10/14/2007   ABNORMAL GLUCOSE NEC 10/14/2007   Past Surgical History:  Procedure Laterality Date   [place] and negative   CHOLECYSTECTOMY  2010   lap with open redo umbilical hernia repair.  Dr. [name]   COLONOSCOPY  11/18/2016   HERNIA REPAIR  [id number]   umbilical open w mesh plug. Dr. [name] and 2010 Dr [name]    KNEE SURGERY  2006   left - arthroscopic   VENTRAL HERNIA REPAIR  08/13/2011   Procedure: LAPAROSCOPIC VENTRAL HERNIA;  Surgeon: Adin Hector, MD;  Location: [facility];  Service: General;  Laterality: N/A;  Laparoscopic Ventral Hernia Repair with Mesh   OB History   No obstetric history on file.    Home Medications    Prior to Admission medications   Medication Sig Start Date End Date Taking? Authorizing Provider  albuterol (VENTOLIN HFA) 108 (90 Base) MCG/ACT inhaler Inhale 1-2 puffs into the lungs every 6 (six) hours as needed for wheezing or shortness of breath. 10/09/21   Chase Picket, MD  amLODipine-benazepril (LOTREL) 10-20 MG capsule TAKE 1 CAPSULE BY MOUTH EVERY DAY 04/22/22   Horald Pollen, MD  Ascorbic Acid (VITAMIN C) POWD Take 1,000 mg by mouth daily.    [provider]  aspirin 81 MG tablet Take 81 mg by mouth daily.    [provider]  blood glucose meter kit and supplies KIT Dispense based on patient and insurance preference. Use up to one time as directed  (FOR ICD-9 250.00, 250.01). 03/24/15   Le, [name], DO  docusate sodium (COLACE) 100 MG capsule Take 100 mg by mouth 2 (two) times daily as needed.    [provider]  fluticasone (FLONASE) 50 MCG/ACT nasal spray SPRAY 1 SPRAY INTO BOTH NOSTRILS DAILY. 09/26/21   Horald Pollen, MD  glucose blood (ONE TOUCH ULTRA TEST) test strip  USE UP TO 1 TIME AS DIRECTED 04/27/19   Horald Pollen, MD  ibuprofen (ADVIL) 600 MG tablet Take 1 tablet (600 mg total) by mouth every 8 (eight) hours as needed. 10/09/21   Lamptey, Myrene Galas, MD  levothyroxine (SYNTHROID) 50 MCG tablet TAKE 1 TABLET BY MOUTH EVERY DAY ANNUAL APPT DUE IN SEPT MUST SEE PROVIDER FOR FUTURE REFILLS 05/21/22   Horald Pollen, MD  meloxicam (MOBIC) 15 MG tablet Take 1 tablet (15 mg total) by mouth daily as needed for pain. 08/05/16   Shawnee Knapp, MD  metFORMIN (GLUCOPHAGE) 1000 MG tablet Take 1 tablet (1,000 mg total) by mouth 2 (two) times daily with a meal. 01/29/21   Horald Pollen, MD  ondansetron (ZOFRAN ODT) 4 MG disintegrating tablet Take 1 tablet (4 mg total) by mouth every 8 (eight) hours as needed for nausea or vomiting. 12/13/20   Volney American, PA-C  OneTouch Delica Lancets 97X MISC USE UP TO 1 TIME AS DIRECTED 12/26/21   Horald Pollen, MD  TRULICITY 4.80 XK/5.5VZ SOPN INJECT 0.75 MG INTO THE SKIN ONCE A WEEK 06/19/22   Horald Pollen, MD    Family History Family History  Problem Relation Age of Onset   Cancer Mother        colon   Heart disease Mother 38       congestive heart failure   COPD Mother    Colon cancer Mother 30   Other Father 56       car accident   Cancer Maternal Grandmother        ovarian   Asthma Son    Allergies Son    Hypertension Paternal Grandmother    Cancer Paternal Grandfather        prostate   Breast cancer Neg Hx    Colon polyps Neg Hx    Esophageal cancer Neg Hx    Rectal cancer Neg Hx    Stomach cancer Neg Hx    Social History Social History   Tobacco Use   Smoking status: Former    Packs/day: 0.10    Years: 4.00    Total pack years: 0.40    Types: Cigarettes    Quit date: 07/07/1978    Years since quitting: 43.9   Smokeless tobacco: Never   Tobacco comments:    occassional smoker with alcohol consumption  Vaping Use   Vaping Use: Never used  Substance Use Topics    Alcohol use: No    Comment: quit 1979   Drug use: No   Allergies   Amoxicillin  Review of Systems Review of Systems Pertinent findings revealed after performing a 14 point review of systems has been noted in the history of present illness.  Physical Exam Triage Vital Signs  ED Triage Vitals  Enc Vitals Group     BP 06/20/21 0827 (!) 147/82     Pulse Rate 06/20/21 0827 72     Resp 06/20/21 0827 18     Temp 06/20/21 0827 98.3 F (36.8 C)     Temp Source 06/20/21 0827 Oral     SpO2 06/20/21 0827 98 %     Weight --      Height --      Head Circumference --      Peak Flow --      Pain Score 06/20/21 0826 5     Pain Loc --      Pain Edu? --      Excl. in [place]? --   No data found.  Updated Vital Signs BP (!) 153/84 (BP Location: Left Arm)   Pulse 64   Temp 98.2 F (36.8 C) (Oral)   Resp 18   SpO2 95%   Physical Exam Vitals and nursing note reviewed.  Constitutional:      General: She is not in acute distress.    Appearance: Normal appearance. She is not ill-appearing.  HENT:     Head: Normocephalic and atraumatic.     Salivary Glands: Right salivary gland is not diffusely enlarged or tender. Left salivary gland is not diffusely enlarged or tender.     Right Ear: Tympanic membrane, ear canal and external ear normal. No drainage. No middle ear effusion. There is no impacted cerumen. Tympanic membrane is not erythematous or bulging.     Left Ear: Tympanic membrane, ear canal and external ear normal. No drainage.  No middle ear effusion. There is no impacted cerumen. Tympanic membrane is not erythematous or bulging.     Nose: Nose normal. No nasal deformity, septal deviation, mucosal edema, congestion or rhinorrhea.     Right Turbinates: Not enlarged, swollen or pale.     Left Turbinates: Not enlarged, swollen or pale.     Right Sinus: No maxillary sinus tenderness or frontal sinus tenderness.     Left Sinus: No maxillary sinus tenderness or frontal sinus tenderness.      Mouth/Throat:     Lips: Pink. No lesions.     Mouth: Mucous membranes are moist. No oral lesions.     Pharynx: Oropharynx is clear. Uvula midline. No posterior oropharyngeal erythema or uvula swelling.     Tonsils: No tonsillar exudate. 0 on the right. 0 on the left.  Eyes:     General: Lids are normal.        Right eye: No discharge.        Left eye: No discharge.     Extraocular Movements: Extraocular movements intact.     Conjunctiva/sclera: Conjunctivae normal.     Right eye: Right conjunctiva is not injected.     Left eye: Left conjunctiva is not injected.  Neck:     Trachea: Trachea and phonation normal.  Cardiovascular:     Rate and Rhythm: Normal rate and regular rhythm.     Pulses: Normal pulses.     Heart sounds: Normal heart sounds. No murmur heard.    No friction rub. No gallop.  Pulmonary:     Effort: Pulmonary effort is normal. No accessory muscle usage, prolonged expiration or respiratory distress.     Breath sounds: Normal breath sounds. No stridor, decreased air movement or transmitted upper airway sounds. No decreased breath sounds, wheezing, rhonchi or rales.  Chest:     Chest wall: No tenderness.  Musculoskeletal:  General: Normal range of motion.     Cervical back: Normal range of motion and neck supple. Normal range of motion.  Lymphadenopathy:     Cervical: No cervical adenopathy.  Skin:    General: Skin is warm and dry.     Findings: No erythema or rash.  Neurological:     General: No focal deficit present.     Mental Status: She is alert and oriented to person, place, and time.  Psychiatric:        Mood and Affect: Mood normal.        Behavior: Behavior normal.     Visual Acuity Right Eye Distance:   Left Eye Distance:   Bilateral Distance:    Right Eye Near:   Left Eye Near:    Bilateral Near:     UC Couse / Diagnostics / Procedures:     Radiology No results found.  Procedures Procedures (including critical care  time) EKG  Pending results:  Labs Reviewed  POCT URINALYSIS DIP (MANUAL ENTRY) - Normal  SARS CORONAVIRUS 2 (TAT 6-24 HRS)    Medications Ordered in UC: Medications - No data to display  UC Diagnoses / Final Clinical Impressions(s)   I have reviewed the triage vital signs and the nursing notes.  Pertinent labs & imaging results that were available during my care of the patient were reviewed by me and considered in my medical decision making (see chart for details).    Final diagnoses:  Febrile illness, acute  Encounter for screening for COVID-19   Urinalysis today was normal, no concern for urinary tract infection.  Patient was provided with a repeat COVID-19 test at her request.  Patient advised to continue Tylenol and that if her fever persists for more than 48 hours, she should go to the emergency room for more acute work-up.  ED Prescriptions   None    PDMP not reviewed this encounter.  Disposition Upon Discharge:  Condition: stable for discharge home Home: take medications as prescribed; routine discharge instructions as discussed; follow up as advised.  Patient presented with an acute illness with associated systemic symptoms and significant discomfort requiring urgent management. In my opinion, this is a condition that a prudent lay person (someone who possesses an average knowledge of health and medicine) may potentially expect to result in complications if not addressed urgently such as respiratory distress, impairment of bodily function or dysfunction of bodily organs.   Routine symptom specific, illness specific and/or disease specific instructions were discussed with the patient and/or caregiver at length.   As such, the patient has been evaluated and assessed, work-up was performed and treatment was provided in alignment with urgent care protocols and evidence based medicine.  Patient/parent/caregiver has been advised that the patient may require follow up for  further testing and treatment if the symptoms continue in spite of treatment, as clinically indicated and appropriate.  If the patient was tested for COVID-19, Influenza and/or RSV, then the patient/parent/guardian was advised to isolate at home pending the results of his/her diagnostic coronavirus test and potentially longer if they're positive. I have also advised pt that if his/her COVID-19 test returns positive, it's recommended to self-isolate for at least 10 days after symptoms first appeared AND until fever-free for 24 hours without fever reducer AND other symptoms have improved or resolved. Discussed self-isolation recommendations as well as instructions for household member/close contacts as per the [facility] and [place] DHHS, and also gave patient the [place] packet with this information.  Patient/parent/caregiver  has been advised to return to the [facility] or PCP in 3-5 days if no better; to PCP or the Emergency Department if new signs and symptoms develop, or if the current signs or symptoms continue to change or worsen for further workup, evaluation and treatment as clinically indicated and appropriate  The patient will follow up with their current PCP if and as advised. If the patient does not currently have a PCP we will assist them in obtaining one.   The patient may need specialty follow up if the symptoms continue, in spite of conservative treatment and management, for further workup, evaluation, consultation and treatment as clinically indicated and appropriate.  Patient/parent/caregiver verbalized understanding and agreement of plan as discussed.  All questions were addressed during visit.  Please see discharge instructions below for further details of plan.  Discharge Instructions:   Discharge Instructions      Your urinalysis today did not reveal any concerns for urinary tract infection.  We repeated your COVID-19 test and the results should be available to your MyChart tomorrow evening.  If  there is a positive result, you will be contacted by phone.  Please let the nurse know if you are interested in starting Paxlovid for treatment for COVID-19 if your test is positive.  Please continue to monitor for fever.  If your fever persists for more than 2 more days, I recommend that you go to the emergency room for further evaluation.  Thank you for visiting urgent care today.      This office note has been dictated using Museum/gallery curator.  Unfortunately, this method of dictation can sometimes lead to typographical or grammatical errors.  I apologize for your inconvenience in advance if this occurs.  Please do not hesitate to reach out to me if clarification is needed.      Lynden Oxford Scales, PA-C 06/22/22 1312

## 2022-06-22 LAB — SARS CORONAVIRUS 2 (TAT 6-24 HRS): SARS Coronavirus 2: NEGATIVE

## 2022-06-23 ENCOUNTER — Ambulatory Visit (INDEPENDENT_AMBULATORY_CARE_PROVIDER_SITE_OTHER): Payer: Federal, State, Local not specified - PPO | Admitting: Emergency Medicine

## 2022-06-23 ENCOUNTER — Encounter: Payer: Self-pay | Admitting: Emergency Medicine

## 2022-06-23 VITALS — BP 138/82 | HR 64 | Temp 98.0°F | Ht 62.0 in | Wt 218.4 lb

## 2022-06-23 DIAGNOSIS — E119 Type 2 diabetes mellitus without complications: Secondary | ICD-10-CM | POA: Diagnosis not present

## 2022-06-23 DIAGNOSIS — G4733 Obstructive sleep apnea (adult) (pediatric): Secondary | ICD-10-CM

## 2022-06-23 DIAGNOSIS — Z1329 Encounter for screening for other suspected endocrine disorder: Secondary | ICD-10-CM | POA: Diagnosis not present

## 2022-06-23 DIAGNOSIS — Z794 Long term (current) use of insulin: Secondary | ICD-10-CM

## 2022-06-23 DIAGNOSIS — I152 Hypertension secondary to endocrine disorders: Secondary | ICD-10-CM

## 2022-06-23 DIAGNOSIS — Z130 Encounter for screening for diseases of the blood and blood-forming organs and certain disorders involving the immune mechanism: Secondary | ICD-10-CM

## 2022-06-23 DIAGNOSIS — E1159 Type 2 diabetes mellitus with other circulatory complications: Secondary | ICD-10-CM | POA: Diagnosis not present

## 2022-06-23 DIAGNOSIS — Z23 Encounter for immunization: Secondary | ICD-10-CM

## 2022-06-23 DIAGNOSIS — Z13228 Encounter for screening for other metabolic disorders: Secondary | ICD-10-CM | POA: Diagnosis not present

## 2022-06-23 DIAGNOSIS — E039 Hypothyroidism, unspecified: Secondary | ICD-10-CM | POA: Diagnosis not present

## 2022-06-23 DIAGNOSIS — Z0000 Encounter for general adult medical examination without abnormal findings: Secondary | ICD-10-CM

## 2022-06-23 DIAGNOSIS — Z13220 Encounter for screening for lipoid disorders: Secondary | ICD-10-CM | POA: Diagnosis not present

## 2022-06-23 DIAGNOSIS — I10 Essential (primary) hypertension: Secondary | ICD-10-CM

## 2022-06-23 LAB — CBC WITH DIFFERENTIAL/PLATELET
Basophils Absolute: 0.1 10*3/uL (ref 0.0–0.1)
Basophils Relative: 1.4 % (ref 0.0–3.0)
Eosinophils Absolute: 0.3 10*3/uL (ref 0.0–0.7)
Eosinophils Relative: 3.7 % (ref 0.0–5.0)
HCT: 41.4 % (ref 36.0–46.0)
Hemoglobin: 13.9 g/dL (ref 12.0–15.0)
Lymphocytes Relative: 33.5 % (ref 12.0–46.0)
Lymphs Abs: 2.3 10*3/uL (ref 0.7–4.0)
MCHC: 33.5 g/dL (ref 30.0–36.0)
MCV: 85.8 fl (ref 78.0–100.0)
Monocytes Absolute: 0.5 10*3/uL (ref 0.1–1.0)
Monocytes Relative: 7.9 % (ref 3.0–12.0)
Neutro Abs: 3.6 10*3/uL (ref 1.4–7.7)
Neutrophils Relative %: 53.5 % (ref 43.0–77.0)
Platelets: 282 10*3/uL (ref 150.0–400.0)
RBC: 4.83 Mil/uL (ref 3.87–5.11)
RDW: 14.3 % (ref 11.5–15.5)
WBC: 6.8 10*3/uL (ref 4.0–10.5)

## 2022-06-23 LAB — COMPREHENSIVE METABOLIC PANEL
ALT: 43 U/L — ABNORMAL HIGH (ref 0–35)
AST: 34 U/L (ref 0–37)
Albumin: 4.4 g/dL (ref 3.5–5.2)
Alkaline Phosphatase: 90 U/L (ref 39–117)
BUN: 13 mg/dL (ref 6–23)
CO2: 24 mEq/L (ref 19–32)
Calcium: 9.7 mg/dL (ref 8.4–10.5)
Chloride: 102 mEq/L (ref 96–112)
Creatinine, Ser: 0.54 mg/dL (ref 0.40–1.20)
GFR: 96.22 mL/min (ref >60.00)
Glucose, Bld: 85 mg/dL (ref 70–99)
Potassium: 4.1 mEq/L (ref 3.5–5.1)
Sodium: 135 mEq/L (ref 135–145)
Total Bilirubin: 0.4 mg/dL (ref 0.2–1.2)
Total Protein: 7.9 g/dL (ref 6.0–8.3)

## 2022-06-23 LAB — LIPID PANEL
Cholesterol: 175 mg/dL (ref 0–200)
HDL: 46.4 mg/dL (ref >39.00)
LDL Cholesterol: 101 mg/dL — ABNORMAL HIGH (ref 0–99)
NonHDL: 128.98
Total CHOL/HDL Ratio: 4
Triglycerides: 138 mg/dL (ref 0.0–149.0)
VLDL: 27.6 mg/dL (ref 0.0–40.0)

## 2022-06-23 LAB — MICROALBUMIN / CREATININE URINE RATIO
Creatinine,U: 95.5 mg/dL
Microalb Creat Ratio: 1 mg/g (ref 0.0–30.0)
Microalb, Ur: 1 mg/dL (ref 0.0–1.9)

## 2022-06-23 LAB — POCT GLYCOSYLATED HEMOGLOBIN (HGB A1C): Hemoglobin A1C: 6.1 % — AB (ref 4.0–5.6)

## 2022-06-23 LAB — TSH: TSH: 3.22 u[IU]/mL (ref 0.35–5.50)

## 2022-06-23 MED ORDER — TRULICITY 0.75 MG/0.5ML ~~LOC~~ SOAJ: 0.7500 mg | SUBCUTANEOUS | 3 refills | Status: DC

## 2022-06-23 MED ORDER — METFORMIN HCL 1000 MG PO TABS: 1000.0000 mg | ORAL_TABLET | Freq: Two times a day (BID) | ORAL | 3 refills | Status: DC

## 2022-06-23 MED ORDER — FLUTICASONE PROPIONATE 50 MCG/ACT NA SUSP: NASAL | 2 refills | Status: DC

## 2022-06-23 MED ORDER — AMLODIPINE BESY-BENAZEPRIL HCL 10-20 MG PO CAPS: 1.0000 | ORAL_CAPSULE | Freq: Every day | ORAL | 3 refills | Status: DC

## 2022-06-23 NOTE — Progress Notes (Signed)
Katherine Sanchez 66 y.o.   Chief Complaint  Patient presents with   Annual Exam    HISTORY OF PRESENT ILLNESS: This is a 65 y.o. female here for her annual exam. Has history of hypertension and diabetes Hypothyroidism Obstructive sleep apnea  HPI   Prior to Admission medications   Medication Sig Start Date End Date Taking? Authorizing Provider  albuterol (VENTOLIN HFA) 108 (90 Base) MCG/ACT inhaler Inhale 1-2 puffs into the lungs every 6 (six) hours as needed for wheezing or shortness of breath. 10/09/21   Chase Picket, MD  amLODipine-benazepril (LOTREL) 10-20 MG capsule TAKE 1 CAPSULE BY MOUTH EVERY DAY 04/22/22   Horald Pollen, MD  Ascorbic Acid (VITAMIN C) POWD Take 1,000 mg by mouth daily.    [provider]  aspirin 81 MG tablet Take 81 mg by mouth daily.    [provider]  blood glucose meter kit and supplies KIT Dispense based on patient and insurance preference. Use up to one time as directed  (FOR ICD-9 250.00, 250.01). 03/24/15   Le, [name], DO  docusate sodium (COLACE) 100 MG capsule Take 100 mg by mouth 2 (two) times daily as needed.    [provider]  fluticasone (FLONASE) 50 MCG/ACT nasal spray SPRAY 1 SPRAY INTO BOTH NOSTRILS DAILY. 09/26/21   Horald Pollen, MD  glucose blood (ONE TOUCH ULTRA TEST) test strip USE UP TO 1 TIME AS DIRECTED 04/27/19   Horald Pollen, MD  ibuprofen (ADVIL) 600 MG tablet Take 1 tablet (600 mg total) by mouth every 8 (eight) hours as needed. 10/09/21   Lamptey, Myrene Galas, MD  levothyroxine (SYNTHROID) 50 MCG tablet TAKE 1 TABLET BY MOUTH EVERY DAY ANNUAL APPT DUE IN SEPT MUST SEE PROVIDER FOR FUTURE REFILLS 05/21/22   Horald Pollen, MD  meloxicam (MOBIC) 15 MG tablet Take 1 tablet (15 mg total) by mouth daily as needed for pain. 08/05/16   Shawnee Knapp, MD  metFORMIN (GLUCOPHAGE) 1000 MG tablet Take 1 tablet (1,000 mg total) by mouth 2 (two) times daily with a meal. 01/29/21   Horald Pollen, MD  ondansetron (ZOFRAN ODT) 4 MG disintegrating tablet Take 1 tablet (4 mg total) by mouth every 8 (eight) hours as needed for nausea or vomiting. 12/13/20   Volney American, PA-C  OneTouch Delica Lancets 35T MISC USE UP TO 1 TIME AS DIRECTED 12/26/21   Horald Pollen, MD  TRULICITY 0.17 BL/3.9QZ SOPN INJECT 0.75 MG INTO THE SKIN ONCE A WEEK 06/19/22   Horald Pollen, MD    Allergies  Allergen Reactions   Amoxicillin     hives hives hives    Patient Active Problem List   Diagnosis Date Noted   Class 2 severe obesity due to excess calories with serious comorbidity and body mass index (BMI) of 38.0 to 38.9 in adult ([place]) 03/26/2020   Statin intolerance 03/26/2020   Vitamin B12 deficiency 02/22/2018   Type 2 diabetes mellitus without complication, with long-term current use of insulin ([place]) 06/22/2017   Acquired hypothyroidism 06/22/2017   OSA (obstructive sleep apnea) 06/28/2013   Obesity (BMI 30-39.9) 07/08/2011   Essential hypertension, benign 10/17/2007   Chronic ischemic heart disease 10/17/2007   Hypertension associated with type 2 diabetes mellitus ([place]) 10/14/2007   Recurrent ventral incisional hernia, periumbilical 00/92/3300   Osteoarthritis 10/14/2007   ABNORMAL GLUCOSE NEC 10/14/2007    Past Medical History:  Diagnosis Date   Abdominal distension  Allergy    per pt   Anxiety    Arthritis    knees    Constipation    Diabetes mellitus without complication (HCC)    GERD (gastroesophageal reflux disease)    Heart murmur    Hyperlipidemia    Hypertension    Hypothyroidism    Incisional hernia    Pneumonia    hx of 2006    Seasonal allergies    Sleep apnea    cpap x 10 yrs Dr [name]    Thyroid disease     Past Surgical History:  Procedure Laterality Date   [place] and negative   CHOLECYSTECTOMY  2010   lap with open redo umbilical hernia repair.  Dr. [name]   COLONOSCOPY   11/18/2016   HERNIA REPAIR  [id number]   umbilical open w mesh plug. Dr. 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   Occupation: care taker  Tobacco Use   Smoking status: Former    Packs/day: 0.10    Years: 4.00    Total pack years: 0.40    Types: Cigarettes    Quit date: 07/07/1978    Years since quitting: 43.9   Smokeless tobacco: Never   Tobacco comments:    occassional smoker with alcohol consumption  Vaping Use   Vaping Use: Never used  Substance and Sexual Activity   Alcohol use: No    Comment: quit 1979   Drug use: No   Sexual activity: Yes    Birth control/protection: None    Comment: number of sex partners in the last 91 months 1  Other Topics Concern   Not on file  Social History Narrative   Lives with husband and his mother is currently living with them after sustaining a fall in Oct. 2013.   Exercise walking 2 times daily   Social Determinants of Health   Financial Resource Strain: Not on file  Food Insecurity: Not on file  Transportation Needs: Not on file  Physical Activity: Not on file  Stress: Not on file  Social Connections: Not on file  Intimate Partner Violence: Not on file    Family History  Problem Relation Age of Onset   Cancer Mother        colon   Heart disease Mother 53       congestive heart failure   COPD Mother    Colon cancer Mother 69   Other Father 63       car accident   Cancer Maternal Grandmother        ovarian   Asthma  Son    Allergies Son    Hypertension Paternal Grandmother    Cancer Paternal Grandfather        prostate   Breast cancer Neg Hx    Colon polyps Neg Hx    Esophageal cancer Neg Hx  Rectal cancer Neg Hx    Stomach cancer Neg Hx      Review of Systems  Constitutional: Negative.  Negative for chills and fever.  HENT:  Negative for congestion and sore throat.   Eyes: Negative.   Respiratory: Negative.  Negative for cough and shortness of breath.   Cardiovascular: Negative.  Negative for chest pain and palpitations.  Gastrointestinal:  Negative for abdominal pain, nausea and vomiting.  Genitourinary: Negative.  Negative for dysuria and hematuria.  Skin: Negative.  Negative for rash.  Neurological: Negative.  Negative for dizziness and headaches.    Today's Vitals   06/23/22 1305  BP: 138/82  Pulse: 64  Temp: 98 F (36.7 C)  TempSrc: Oral  SpO2: 95%  Weight: 218 lb 6 oz (99.1 kg)  Height: _0  (1.575 m)   Body mass index is 39.94 kg/m. Wt Readings from Last 3 Encounters:  06/23/22 218 lb 6 oz (99.1 kg)  03/18/22 216 lb (98 kg)  04/30/21 211 lb (95.7 kg)    Physical Exam Vitals reviewed.  Constitutional:      Appearance: Normal appearance. She is obese.  HENT:     Head: Normocephalic.     Right Ear: Tympanic membrane, ear canal and external ear normal.     Left Ear: Tympanic membrane, ear canal and external ear normal.     Mouth/Throat:     Mouth: Mucous membranes are moist.     Pharynx: Oropharynx is clear.  Eyes:     Extraocular Movements: Extraocular movements intact.     Conjunctiva/sclera: Conjunctivae normal.     Pupils: Pupils are equal, round, and reactive to light.  Cardiovascular:     Rate and Rhythm: Normal rate and regular rhythm.     Pulses: Normal pulses.     Heart sounds: Normal heart sounds.  Pulmonary:     Effort: Pulmonary effort is normal.     Breath sounds: Normal breath sounds.  Abdominal:     Palpations: Abdomen is soft.      Tenderness: There is no abdominal tenderness.  Musculoskeletal:     Cervical back: No tenderness.  Lymphadenopathy:     Cervical: No cervical adenopathy.  Skin:    General: Skin is warm and dry.     Capillary Refill: Capillary refill takes less than 2 seconds.  Neurological:     General: No focal deficit present.     Mental Status: She is alert and oriented to person, place, and time.  Psychiatric:        Mood and Affect: Mood normal.        Behavior: Behavior normal.     Results for orders placed or performed in visit on 06/23/22 (from the past 24 hour(s))  POCT HgB A1C     Status: Abnormal   Collection Time: 06/23/22  2:02 PM  Result Value Ref Range   Hemoglobin A1C 6.1 (A) 4.0 - 5.6 %   HbA1c POC (<> result, manual entry)     HbA1c, POC (prediabetic range)     HbA1c, POC (controlled diabetic range)      ASSESSMENT & PLAN: Problem List Items Addressed This Visit       Cardiovascular and Mediastinum   Essential hypertension, benign   Relevant Medications   amLODipine-benazepril (LOTREL) 10-20 MG capsule   Hypertension associated with type 2 diabetes mellitus ([place])    Well-controlled hypertension. Continue Lotrel 10-20 once daily Well-controlled diabetes with hemoglobin A1c of 6.1. Metformin 1000 mg twice a day and weekly  Trulicity 2.45 mg. Cardiac vascular risks associated with diabetes and hypertension discussed. Diet and nutrition discussed.      Relevant Medications   Dulaglutide (TRULICITY) 8.09 XI/3.3AS SOPN   amLODipine-benazepril (LOTREL) 10-20 MG capsule   metFORMIN (GLUCOPHAGE) 1000 MG tablet   Other Relevant Orders   CBC with Differential   Comprehensive metabolic panel   Lipid panel   Urine Microalbumin w/creat. ratio     Respiratory   OSA (obstructive sleep apnea)     Endocrine   Type 2 diabetes mellitus without complication, with long-term current use of insulin (HCC)   Relevant Medications   Dulaglutide (TRULICITY) 5.05 LZ/7.6BH SOPN    amLODipine-benazepril (LOTREL) 10-20 MG capsule   metFORMIN (GLUCOPHAGE) 1000 MG tablet   Other Relevant Orders   POCT HgB A1C (Completed)   Acquired hypothyroidism    Clinically euthyroid.  TSH done today. Continue Synthroid 50 mcg daily.      Relevant Orders   TSH   Other Visit Diagnoses     Routine general medical examination at a health care facility    -  Primary   Screening for deficiency anemia       Relevant Orders   CBC with Differential   Screening for lipoid disorders       Relevant Orders   Lipid panel   Screening for endocrine, metabolic and immunity disorder       Relevant Orders   Comprehensive metabolic panel   Need for vaccination       Relevant Orders   Flu Vaccine QUAD High Dose(Fluad) (Completed)   Pneumococcal conjugate vaccine 20-valent (Prevnar 20) (Completed)      Modifiable risk factors discussed with patient. Anticipatory guidance according to age provided. The following topics were also discussed: Social Determinants of Health Smoking.  Non-smoker Diet and nutrition and need to decrease amount of daily carbohydrate intake and daily calories and increase amount of plant based protein in her diet Benefits of exercise and need to walk more often Cancer screening and review of most recent mammogram and colonoscopy reports Vaccinations reviewed and recommendations Cardiovascular risk assessment and need for blood work The 10-year ASCVD risk score (Arnett DK, et al., 2019) is: 16.7%   Values used to calculate the score:     Age: 13 years     Sex: Female     Is Non-Hispanic African American: No     Diabetic: Yes     Tobacco smoker: No     Systolic Blood Pressure: 419 mmHg     Is BP treated: Yes     HDL Cholesterol: 49 mg/dL     Total Cholesterol: 161 mg/dL Review of chronic medical problems and management Review of all medications Mental health including depression and anxiety Fall and accident prevention  Patient Instructions  Health  Maintenance, Female Adopting a healthy lifestyle and getting preventive care are important in promoting health and wellness. Ask your health care provider about: The right schedule for you to have regular tests and exams. Things you can do on your own to prevent diseases and keep yourself healthy. What should I know about diet, weight, and exercise? Eat a healthy diet  Eat a diet that includes plenty of vegetables, fruits, low-fat dairy products, and lean protein. Do not eat a lot of foods that are high in solid fats, added sugars, or sodium. Maintain a healthy weight Body mass index (BMI) is used to identify weight problems. It estimates body fat based on height and weight. Your health care  provider can help determine your BMI and help you achieve or maintain a healthy weight. Get regular exercise Get regular exercise. This is one of the most important things you can do for your health. Most adults should: Exercise for at least 150 minutes each week. The exercise should increase your heart rate and make you sweat (moderate-intensity exercise). Do strengthening exercises at least twice a week. This is in addition to the moderate-intensity exercise. Spend less time sitting. Even light physical activity can be beneficial. Watch cholesterol and blood lipids Have your blood tested for lipids and cholesterol at 66 years of age, then have this test every 5 years. Have your cholesterol levels checked more often if: Your lipid or cholesterol levels are high. You are older than 66 years of age. You are at high risk for heart disease. What should I know about cancer screening? Depending on your health history and family history, you may need to have cancer screening at various ages. This may include screening for: Breast cancer. Cervical cancer. Colorectal cancer. Skin cancer. Lung cancer. What should I know about heart disease, diabetes, and high blood pressure? Blood pressure and heart  disease High blood pressure causes heart disease and increases the risk of stroke. This is more likely to develop in people who have high blood pressure readings or are overweight. Have your blood pressure checked: Every 3-5 years if you are 20-62 years of age. Every year if you are 79 years old or older. Diabetes Have regular diabetes screenings. This checks your fasting blood sugar level. Have the screening done: Once every three years after age 66 if you are at a normal weight and have a low risk for diabetes. More often and at a younger age if you are overweight or have a high risk for diabetes. What should I know about preventing infection? Hepatitis B If you have a higher risk for hepatitis B, you should be screened for this virus. Talk with your health care provider to find out if you are at risk for hepatitis B infection. Hepatitis C Testing is recommended for: Everyone born from [birth year] through 1965. Anyone with known risk factors for hepatitis C. Sexually transmitted infections (STIs) Get screened for STIs, including gonorrhea and chlamydia, if: You are sexually active and are younger than 66 years of age. You are older than 66 years of age and your health care provider tells you that you are at risk for this type of infection. Your sexual activity has changed since you were last screened, and you are at increased risk for chlamydia or gonorrhea. Ask your health care provider if you are at risk. Ask your health care provider about whether you are at high risk for HIV. Your health care provider may recommend a prescription medicine to help prevent HIV infection. If you choose to take medicine to prevent HIV, you should first get tested for HIV. You should then be tested every 3 months for as long as you are taking the medicine. Pregnancy If you are about to stop having your period (premenopausal) and you may become pregnant, seek counseling before you get pregnant. Take 400 to 800  micrograms (mcg) of folic acid every day if you become pregnant. Ask for birth control (contraception) if you want to prevent pregnancy. Osteoporosis and menopause Osteoporosis is a disease in which the bones lose minerals and strength with aging. This can result in bone fractures. If you are 82 years old or older, or if you are at risk for  osteoporosis and fractures, ask your health care provider if you should: Be screened for bone loss. Take a calcium or vitamin D supplement to lower your risk of fractures. Be given hormone replacement therapy (HRT) to treat symptoms of menopause. Follow these instructions at home: Alcohol use Do not drink alcohol if: Your health care provider tells you not to drink. You are pregnant, may be pregnant, or are planning to become pregnant. If you drink alcohol: Limit how much you have to: 0-1 drink a day. Know how much alcohol is in your drink. In the [place] one drink equals one 12 oz bottle of beer (355 mL), one 5 oz glass of wine (148 mL), or one 1 oz glass of hard liquor (44 mL). Lifestyle Do not use any products that contain nicotine or tobacco. These products include cigarettes, chewing tobacco, and vaping devices, such as e-cigarettes. If you need help quitting, ask your health care provider. Do not use street drugs. Do not share needles. Ask your health care provider for help if you need support or information about quitting drugs. General instructions Schedule regular health, dental, and eye exams. Stay current with your vaccines. Tell your health care provider if: You often feel depressed. You have ever been abused or do not feel safe at home. Summary Adopting a healthy lifestyle and getting preventive care are important in promoting health and wellness. Follow your health care provider's instructions about healthy diet, exercising, and getting tested or screened for diseases. Follow your health care provider's instructions on monitoring your  cholesterol and blood pressure. This information is not intended to replace advice given to you by your health care provider. Make sure you discuss any questions you have with your health care provider. Document Revised: 12/30/2020 Document Reviewed: 12/30/2020 Elsevier Patient Education  [place], MD [facility]

## 2022-06-23 NOTE — Patient Instructions (Signed)

## 2022-06-23 NOTE — Assessment & Plan Note (Signed)
Clinically euthyroid.  TSH done today. Continue Synthroid 50 mcg daily. 

## 2022-06-23 NOTE — Assessment & Plan Note (Signed)
Well-controlled hypertension. Continue Lotrel 10-20 once daily Well-controlled diabetes with hemoglobin A1c of 6.1. Metformin 1000 mg twice a day and weekly Trulicity 7.99 mg. Cardiac vascular risks associated with diabetes and hypertension discussed. Diet and nutrition discussed.

## 2022-07-07 ENCOUNTER — Other Ambulatory Visit: Payer: Self-pay | Admitting: Emergency Medicine

## 2022-07-07 DIAGNOSIS — Z1231 Encounter for screening mammogram for malignant neoplasm of breast: Secondary | ICD-10-CM

## 2022-07-12 DIAGNOSIS — G4733 Obstructive sleep apnea (adult) (pediatric): Secondary | ICD-10-CM | POA: Diagnosis not present

## 2022-07-20 DIAGNOSIS — Z96653 Presence of artificial knee joint, bilateral: Secondary | ICD-10-CM | POA: Diagnosis not present

## 2022-08-11 DIAGNOSIS — G4733 Obstructive sleep apnea (adult) (pediatric): Secondary | ICD-10-CM | POA: Diagnosis not present

## 2022-08-12 ENCOUNTER — Ambulatory Visit
Admission: RE | Admit: 2022-08-12 | Discharge: 2022-08-12 | Disposition: A | Discharge status: Home / Self Care | Payer: Federal, State, Local not specified - PPO | Source: Ambulatory Visit | Attending: Emergency Medicine | Admitting: Emergency Medicine

## 2022-08-12 DIAGNOSIS — Z1231 Encounter for screening mammogram for malignant neoplasm of breast: Secondary | ICD-10-CM

## 2022-08-13 ENCOUNTER — Telehealth: Payer: Self-pay | Admitting: Emergency Medicine

## 2022-08-13 DIAGNOSIS — E039 Hypothyroidism, unspecified: Secondary | ICD-10-CM

## 2022-08-13 NOTE — Telephone Encounter (Signed)
Patient wants her levothyroxine to be called in for 90 days.  IT was called in for 30.  [facility] on [address]  Patient #  [phone]

## 2022-08-14 MED ORDER — LEVOTHYROXINE SODIUM 50 MCG PO TABS: ORAL_TABLET | ORAL | 2 refills | Status: DC

## 2022-08-14 NOTE — Telephone Encounter (Signed)
Prescription sent to patient requested pharmacy  

## 2022-10-20 DIAGNOSIS — G4733 Obstructive sleep apnea (adult) (pediatric): Secondary | ICD-10-CM | POA: Diagnosis not present

## 2022-11-09 ENCOUNTER — Other Ambulatory Visit: Payer: Self-pay | Admitting: Emergency Medicine

## 2022-11-18 DIAGNOSIS — G4733 Obstructive sleep apnea (adult) (pediatric): Secondary | ICD-10-CM | POA: Diagnosis not present

## 2022-12-19 DIAGNOSIS — G4733 Obstructive sleep apnea (adult) (pediatric): Secondary | ICD-10-CM | POA: Diagnosis not present

## 2022-12-22 ENCOUNTER — Ambulatory Visit: Payer: Federal, State, Local not specified - PPO | Admitting: Emergency Medicine

## 2022-12-22 ENCOUNTER — Encounter: Payer: Self-pay | Admitting: Emergency Medicine

## 2022-12-22 VITALS — BP 132/78 | HR 67 | Temp 97.8°F | Ht 62.0 in | Wt 216.4 lb

## 2022-12-22 DIAGNOSIS — Z789 Other specified health status: Secondary | ICD-10-CM

## 2022-12-22 DIAGNOSIS — E039 Hypothyroidism, unspecified: Secondary | ICD-10-CM

## 2022-12-22 DIAGNOSIS — I259 Chronic ischemic heart disease, unspecified: Secondary | ICD-10-CM

## 2022-12-22 DIAGNOSIS — E1159 Type 2 diabetes mellitus with other circulatory complications: Secondary | ICD-10-CM | POA: Diagnosis not present

## 2022-12-22 DIAGNOSIS — Z6838 Body mass index (BMI) 38.0-38.9, adult: Secondary | ICD-10-CM

## 2022-12-22 DIAGNOSIS — Z23 Encounter for immunization: Secondary | ICD-10-CM | POA: Diagnosis not present

## 2022-12-22 DIAGNOSIS — I152 Hypertension secondary to endocrine disorders: Secondary | ICD-10-CM | POA: Diagnosis not present

## 2022-12-22 DIAGNOSIS — G4733 Obstructive sleep apnea (adult) (pediatric): Secondary | ICD-10-CM | POA: Diagnosis not present

## 2022-12-22 LAB — POCT GLYCOSYLATED HEMOGLOBIN (HGB A1C): Hemoglobin A1C: 5.5 % (ref 4.0–5.6)

## 2022-12-22 MED ORDER — TRULICITY 1.5 MG/0.5ML ~~LOC~~ SOAJ: 1.5000 mg | SUBCUTANEOUS | 7 refills | Status: DC

## 2022-12-22 NOTE — Assessment & Plan Note (Signed)
Stable.  No recent anginal episodes.  No concerns.

## 2022-12-22 NOTE — Assessment & Plan Note (Signed)
Stable.  On CPAP treatment.  Excellent compliance.

## 2022-12-22 NOTE — Patient Instructions (Signed)
Health Maintenance After Age 67 After age 67, you are at a higher risk for certain long-term diseases and infections as well as injuries from falls. Falls are a major cause of broken bones and head injuries in people who are older than age 67. Getting regular preventive care can help to keep you healthy and well. Preventive care includes getting regular testing and making lifestyle changes as recommended by your health care provider. Talk with your health care provider about: Which screenings and tests you should have. A screening is a test that checks for a disease when you have no symptoms. A diet and exercise plan that is right for you. What should I know about screenings and tests to prevent falls? Screening and testing are the best ways to find a health problem early. Early diagnosis and treatment give you the best chance of managing medical conditions that are common after age 67. Certain conditions and lifestyle choices may make you more likely to have a fall. Your health care provider may recommend: Regular vision checks. Poor vision and conditions such as cataracts can make you more likely to have a fall. If you wear glasses, make sure to get your prescription updated if your vision changes. Medicine review. Work with your health care provider to regularly review all of the medicines you are taking, including over-the-counter medicines. Ask your health care provider about any side effects that may make you more likely to have a fall. Tell your health care provider if any medicines that you take make you feel dizzy or sleepy. Strength and balance checks. Your health care provider may recommend certain tests to check your strength and balance while standing, walking, or changing positions. Foot health exam. Foot pain and numbness, as well as not wearing proper footwear, can make you more likely to have a fall. Screenings, including: Osteoporosis screening. Osteoporosis is a condition that causes  the bones to get weaker and break more easily. Blood pressure screening. Blood pressure changes and medicines to control blood pressure can make you feel dizzy. Depression screening. You may be more likely to have a fall if you have a fear of falling, feel depressed, or feel unable to do activities that you used to do. Alcohol use screening. Using too much alcohol can affect your balance and may make you more likely to have a fall. Follow these instructions at home: Lifestyle Do not drink alcohol if: Your health care provider tells you not to drink. If you drink alcohol: Limit how much you have to: 0-1 drink a day for women. 0-2 drinks a day for men. Know how much alcohol is in your drink. In the [place] one drink equals one 12 oz bottle of beer (355 mL), one 5 oz glass of wine (148 mL), or one 1 oz glass of hard liquor (44 mL). Do not use any products that contain nicotine or tobacco. These products include cigarettes, chewing tobacco, and vaping devices, such as e-cigarettes. If you need help quitting, ask your health care provider. Activity  Follow a regular exercise program to stay fit. This will help you maintain your balance. Ask your health care provider what types of exercise are appropriate for you. If you need a cane or walker, use it as recommended by your health care provider. Wear supportive shoes that have nonskid soles. Safety  Remove any tripping hazards, such as rugs, cords, and clutter. Install safety equipment such as grab bars in bathrooms and safety rails on stairs. Keep rooms and walkways   well-lit. General instructions Talk with your health care provider about your risks for falling. Tell your health care provider if: You fall. Be sure to tell your health care provider about all falls, even ones that seem minor. You feel dizzy, tiredness (fatigue), or off-balance. Take over-the-counter and prescription medicines only as told by your health care provider. These include  supplements. Eat a healthy diet and maintain a healthy weight. A healthy diet includes low-fat dairy products, low-fat (lean) meats, and fiber from whole grains, beans, and lots of fruits and vegetables. Stay current with your vaccines. Schedule regular health, dental, and eye exams. Summary Having a healthy lifestyle and getting preventive care can help to protect your health and wellness after age 67. Screening and testing are the best way to find a health problem early and help you avoid having a fall. Early diagnosis and treatment give you the best chance for managing medical conditions that are more common for people who are older than age 67. Falls are a major cause of broken bones and head injuries in people who are older than age 67. Take precautions to prevent a fall at home. Work with your health care provider to learn what changes you can make to improve your health and wellness and to prevent falls. This information is not intended to replace advice given to you by your health care provider. Make sure you discuss any questions you have with your health care provider. Document Revised: 12/30/2020 Document Reviewed: 12/30/2020 Elsevier Patient Education  2023 [employer]  

## 2022-12-22 NOTE — Progress Notes (Signed)
Lab Results  Component Value Date   HGBA1C 6.1 (A) 06/23/2022   BP Readings from Last 3 Encounters:  06/23/22 138/82  06/21/22 (!) 153/84  03/18/22 133/78   Wt Readings from Last 3 Encounters:  06/23/22 218 lb 6 oz (99.1 kg)  03/18/22 216 lb (98 kg)  04/30/21 211 lb (95.7 kg)   Katherine Sanchez 67 y.o.   Chief Complaint  Patient presents with   Medical Management of Chronic Issues    f/u appt, no concerns     HISTORY OF PRESENT ILLNESS: This is a 67 y.o. female month follow-up of chronic medical conditions including diabetes and hypertension. Wt Readings from Last 3 Encounters:  12/22/22 216 lb 6 oz (98.1 kg)  06/23/22 218 lb 6 oz (99.1 kg)  03/18/22 216 lb (98 kg)     HPI   Prior to Admission medications   Medication Sig Start Date End Date Taking? Authorizing Provider  albuterol (VENTOLIN HFA) 108 (90 Base) MCG/ACT inhaler Inhale 1-2 puffs into the lungs every 6 (six) hours as needed for wheezing or shortness of breath. 10/09/21  Yes Lamptey, Britta Mccreedy, MD  amLODipine-benazepril (LOTREL) 10-20 MG capsule Take 1 capsule by mouth daily. 06/23/22  Yes Ghadeer Kastelic, Eilleen Kempf, MD  Ascorbic Acid (VITAMIN C) POWD Take 1,000 mg by mouth daily.   Yes [provider]  aspirin 81 MG tablet Take 81 mg by mouth daily.   Yes [provider]  blood glucose meter kit and supplies KIT Dispense based on patient and insurance preference. Use up to one time as directed  (FOR ICD-9 250.00, 250.01). 03/24/15  Yes Le, [name], DO  docusate sodium (COLACE) 100 MG capsule Take 100 mg by mouth 2 (two) times daily as needed.   Yes [provider]  Dulaglutide (TRULICITY) 0.75 MG/0.5ML SOPN Inject 0.75 mg into the skin once a week. 06/23/22  Yes Stan Cantave, Eilleen Kempf, MD  fluticasone [facility]) 50 MCG/ACT nasal spray INSTILL 1 SPRAY INTO BOTH NOSTRILS DAILY 11/09/22  Yes Narcisa Ganesh, [place], MD  glucose blood (ONE TOUCH ULTRA TEST) test strip USE UP TO 1 TIME AS  DIRECTED 04/27/19  Yes Dinara Lupu, Eilleen Kempf, MD  ibuprofen (ADVIL) 600 MG tablet Take 1 tablet (600 mg total) by mouth every 8 (eight) hours as needed. 10/09/21  Yes Lamptey, Britta Mccreedy, MD  levothyroxine (SYNTHROID) 50 MCG tablet Take one tablet by mouth daily 08/14/22  Yes Amia Rynders, Eilleen Kempf, MD  meloxicam (MOBIC) 15 MG tablet Take 1 tablet (15 mg total) by mouth daily as needed for pain. 08/05/16  Yes Sherren Mocha, MD  metFORMIN (GLUCOPHAGE) 1000 MG tablet Take 1 tablet (1,000 mg total) by mouth 2 (two) times daily with a meal. 06/23/22  Yes Riccardo Holeman, Eilleen Kempf, MD  OneTouch Delica Lancets 33G MISC USE UP TO 1 TIME AS DIRECTED 12/26/21  Yes Georgina Quint, MD    Allergies  Allergen Reactions   Amoxicillin     hives hives hives    Patient Active Problem List   Diagnosis Date Noted   Class 2 severe obesity due to excess calories with serious comorbidity and body mass index (BMI) of 38.0 to 38.9 in adult (HCC) 03/26/2020   Statin intolerance 03/26/2020   Vitamin B12 deficiency 02/22/2018   Type 2 diabetes mellitus without complication, with long-term current use of insulin (HCC) 06/22/2017   Acquired hypothyroidism 06/22/2017   OSA (obstructive sleep apnea) 06/28/2013   Obesity (BMI 30-39.9) 07/08/2011   Essential hypertension, benign  10/17/2007   Chronic ischemic heart disease 10/17/2007   Hypertension associated with type 2 diabetes mellitus (HCC) 10/14/2007   Recurrent ventral incisional hernia, periumbilical 10/14/2007   Osteoarthritis 10/14/2007   ABNORMAL GLUCOSE NEC 10/14/2007    Past Medical History:  Diagnosis Date   Abdominal distension    Allergy    per pt   Anxiety    Arthritis    knees    Constipation    Diabetes mellitus without complication (HCC)    GERD (gastroesophageal reflux disease)    Heart murmur    Hyperlipidemia    Hypertension    Hypothyroidism    Incisional hernia    Pneumonia    hx of 2006    Seasonal allergies    Sleep apnea     cpap x 10 yrs Dr Shelle Iron    Thyroid disease     Past Surgical History:  Procedure Laterality Date   APPENDECTOMY  1982   CARDIAC CATHETERIZATION     1999 and negative   CHOLECYSTECTOMY  2010   lap with open redo umbilical hernia repair.  Dr. Zachery Dakins   COLONOSCOPY  11/18/2016   HERNIA REPAIR  2004   umbilical open w mesh plug. Dr. 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   Occupation: care taker  Tobacco Use   Smoking status: Former    Packs/day: 0.10    Years: 4.00    Additional pack years: 0.00    Total pack years: 0.40    Types: Cigarettes    Quit date: 07/07/1978    Years since quitting: 44.4   Smokeless tobacco: Never   Tobacco comments:    occassional smoker with alcohol consumption  Vaping Use   Vaping Use: Never used  Substance and Sexual Activity   Alcohol use: No    Comment: quit 1979   Drug use: No   Sexual activity: Yes    Birth control/protection: None    Comment: number of sex partners in the last 12 months 1  Other Topics Concern   Not on file  Social History Narrative   Lives with husband and his mother is currently living with them after sustaining a fall in Oct. 2013.   Exercise walking 2 times daily   Social Determinants of Health   Financial Resource Strain: Not on file  Food Insecurity: Not on file  Transportation Needs: Not on file  Physical Activity: Not on file  Stress: Not on file  Social Connections: Not on file  Intimate  Partner Violence: Not on file    Family History  Problem Relation Age of Onset   Cancer Mother        colon   Heart disease Mother 15       congestive heart failure   COPD Mother    Colon cancer Mother 46   Other Father 110       car accident   Cancer Maternal Grandmother  ovarian   Asthma Son    Allergies Son    Hypertension Paternal Grandmother    Cancer Paternal Grandfather        prostate   Breast cancer Neg Hx    Colon polyps Neg Hx    Esophageal cancer Neg Hx    Rectal cancer Neg Hx    Stomach cancer Neg Hx      Review of Systems  Constitutional: Negative.  Negative for chills and fever.  HENT: Negative.  Negative for congestion and sore throat.   Respiratory: Negative.  Negative for cough and shortness of breath.   Cardiovascular: Negative.  Negative for chest pain and palpitations.  Gastrointestinal:  Negative for abdominal pain, diarrhea, nausea and vomiting.  Genitourinary: Negative.  Negative for dysuria and hematuria.  Skin: Negative.  Negative for rash.  Neurological: Negative.  Negative for dizziness and headaches.  All other systems reviewed and are negative.   Today's Vitals   12/22/22 1301  BP: 132/78  Pulse: 67  Temp: 97.8 F (36.6 C)  TempSrc: Oral  SpO2: 96%  Weight: 216 lb 6 oz (98.1 kg)  Height: 5\' 2"  (1.575 m)   Body mass index is 39.58 kg/m.   Physical Exam Vitals reviewed.  Constitutional:      Appearance: Normal appearance.  HENT:     Head: Normocephalic.     Mouth/Throat:     Mouth: Mucous membranes are moist.     Pharynx: Oropharynx is clear.  Eyes:     Extraocular Movements: Extraocular movements intact.     Pupils: Pupils are equal, round, and reactive to light.  Cardiovascular:     Rate and Rhythm: Normal rate and regular rhythm.     Pulses: Normal pulses.     Heart sounds: Normal heart sounds.  Pulmonary:     Effort: Pulmonary effort is normal.     Breath sounds: Normal breath sounds.  Musculoskeletal:      Cervical back: No tenderness.  Lymphadenopathy:     Cervical: No cervical adenopathy.  Skin:    General: Skin is warm and dry.  Neurological:     Mental Status: She is alert and oriented to person, place, and time.  Psychiatric:        Mood and Affect: Mood normal.        Behavior: Behavior normal.    Results for orders placed or performed in visit on 12/22/22 (from the past 24 hour(s))  POCT HgB A1C     Status: Normal   Collection Time: 12/22/22  2:51 PM  Result Value Ref Range   Hemoglobin A1C 5.5 4.0 - 5.6 %   HbA1c POC (<> result, manual entry)     HbA1c, POC (prediabetic range)     HbA1c, POC (controlled diabetic range)       ASSESSMENT & PLAN: A total of 47 minutes was spent with the patient and counseling/coordination of care regarding preparing for this visit, review of most recent office visit notes, review of most recent blood work results including interpretation of today's hemoglobin A1c, review of multiple chronic medical conditions and their management, review of all medications, cardiovascular risks associated with hypertension and diabetes, education on nutrition, prognosis, review of health maintenance items, documentation, and need for follow-up.  Problem List Items Addressed This Visit       Cardiovascular and Mediastinum   Hypertension associated with type 2 diabetes mellitus (HCC) - Primary    Well-controlled hypertension with normal blood pressure readings at home. Continue Lotrel 10-20  mg daily Well-controlled diabetes with hemoglobin A1c of 5.5 Continue weekly Trulicity 0.75 mg and metformin 1000 mg twice a day Cardiovascular risk associated with hypertension and diabetes discussed Diet and nutrition discussed Benefits of exercise discussed      Relevant Medications   Dulaglutide (TRULICITY) 1.5 MG/0.5ML SOPN   Other Relevant Orders   POCT HgB A1C   Chronic ischemic heart disease    Stable.  No recent anginal episodes.  No concerns.         Respiratory   OSA (obstructive sleep apnea)    Stable.  On CPAP treatment.  Excellent compliance.        Endocrine   Acquired hypothyroidism    Clinically euthyroid.  Continue Synthroid 50 mcg daily        Other   Class 2 severe obesity due to excess calories with serious comorbidity and body mass index (BMI) of 38.0 to 38.9 in adult [facility])    Eating better Diet and nutrition discussed.  Advised to decrease amount of daily carbohydrate intake and daily calories and increase amount of plant-based protein in her diet. Benefits of exercise discussed Recommend to increase Trulicity to 1.5 mg weekly      Relevant Medications   Dulaglutide (TRULICITY) 1.5 MG/0.5ML SOPN   Statin intolerance   Other Visit Diagnoses     Need for vaccination       Relevant Orders   Zoster Recombinant (Shingrix )      Patient Instructions  Health Maintenance After Age 49 After age 28, you are at a higher risk for certain long-term diseases and infections as well as injuries from falls. Falls are a major cause of broken bones and head injuries in people who are older than age 93. Getting regular preventive care can help to keep you healthy and well. Preventive care includes getting regular testing and making lifestyle changes as recommended by your health care provider. Talk with your health care provider about: Which screenings and tests you should have. A screening is a test that checks for a disease when you have no symptoms. A diet and exercise plan that is right for you. What should I know about screenings and tests to prevent falls? Screening and testing are the best ways to find a health problem early. Early diagnosis and treatment give you the best chance of managing medical conditions that are common after age 45. Certain conditions and lifestyle choices may make you more likely to have a fall. Your health care provider may recommend: Regular vision checks. Poor vision and conditions such as  cataracts can make you more likely to have a fall. If you wear glasses, make sure to get your prescription updated if your vision changes. Medicine review. Work with your health care provider to regularly review all of the medicines you are taking, including over-the-counter medicines. Ask your health care provider about any side effects that may make you more likely to have a fall. Tell your health care provider if any medicines that you take make you feel dizzy or sleepy. Strength and balance checks. Your health care provider may recommend certain tests to check your strength and balance while standing, walking, or changing positions. Foot health exam. Foot pain and numbness, as well as not wearing proper footwear, can make you more likely to have a fall. Screenings, including: Osteoporosis screening. Osteoporosis is a condition that causes the bones to get weaker and break more easily. Blood pressure screening. Blood pressure changes and medicines to control blood  pressure can make you feel dizzy. Depression screening. You may be more likely to have a fall if you have a fear of falling, feel depressed, or feel unable to do activities that you used to do. Alcohol use screening. Using too much alcohol can affect your balance and may make you more likely to have a fall. Follow these instructions at home: Lifestyle Do not drink alcohol if: Your health care provider tells you not to drink. If you drink alcohol: Limit how much you have to: 0-1 drink a day for women. 0-2 drinks a day for men. Know how much alcohol is in your drink. In the [place] one drink equals one 12 oz bottle of beer (355 mL), one 5 oz glass of wine (148 mL), or one 1 oz glass of hard liquor (44 mL). Do not use any products that contain nicotine or tobacco. These products include cigarettes, chewing tobacco, and vaping devices, such as e-cigarettes. If you need help quitting, ask your health care provider. Activity  Follow a  regular exercise program to stay fit. This will help you maintain your balance. Ask your health care provider what types of exercise are appropriate for you. If you need a cane or walker, use it as recommended by your health care provider. Wear supportive shoes that have nonskid soles. Safety  Remove any tripping hazards, such as rugs, cords, and clutter. Install safety equipment such as grab bars in bathrooms and safety rails on stairs. Keep rooms and walkways well-lit. General instructions Talk with your health care provider about your risks for falling. Tell your health care provider if: You fall. Be sure to tell your health care provider about all falls, even ones that seem minor. You feel dizzy, tiredness (fatigue), or off-balance. Take over-the-counter and prescription medicines only as told by your health care provider. These include supplements. Eat a healthy diet and maintain a healthy weight. A healthy diet includes low-fat dairy products, low-fat (lean) meats, and fiber from whole grains, beans, and lots of fruits and vegetables. Stay current with your vaccines. Schedule regular health, dental, and eye exams. Summary Having a healthy lifestyle and getting preventive care can help to protect your health and wellness after age 59. Screening and testing are the best way to find a health problem early and help you avoid having a fall. Early diagnosis and treatment give you the best chance for managing medical conditions that are more common for people who are older than age 70. Falls are a major cause of broken bones and head injuries in people who are older than age 72. Take precautions to prevent a fall at home. Work with your health care provider to learn what changes you can make to improve your health and wellness and to prevent falls. This information is not intended to replace advice given to you by your health care provider. Make sure you discuss any questions you have with your  health care provider. Document Revised: 12/30/2020 Document Reviewed: 12/30/2020 Elsevier Patient Education  2023 Elsevier Inc.      Edwina Barth, MD [facility]

## 2022-12-22 NOTE — Assessment & Plan Note (Signed)
Eating better Diet and nutrition discussed.  Advised to decrease amount of daily carbohydrate intake and daily calories and increase amount of plant-based protein in her diet. Benefits of exercise discussed Recommend to increase Trulicity to 1.5 mg weekly

## 2022-12-22 NOTE — Assessment & Plan Note (Signed)
Clinically euthyroid.  Continue Synthroid 50 mcg daily. 

## 2022-12-22 NOTE — Assessment & Plan Note (Signed)
Well-controlled hypertension with normal blood pressure readings at home. Continue Lotrel 10-20 mg daily Well-controlled diabetes with hemoglobin A1c of 5.5 Continue weekly Trulicity 0.75 mg and metformin 1000 mg twice a day Cardiovascular risk associated with hypertension and diabetes discussed Diet and nutrition discussed Benefits of exercise discussed

## 2023-02-16 DIAGNOSIS — G4733 Obstructive sleep apnea (adult) (pediatric): Secondary | ICD-10-CM | POA: Diagnosis not present

## 2023-03-18 DIAGNOSIS — G4733 Obstructive sleep apnea (adult) (pediatric): Secondary | ICD-10-CM | POA: Diagnosis not present

## 2023-03-23 NOTE — Patient Instructions (Incomplete)
Please continue using your CPAP regularly. While your insurance requires that you use CPAP at least 4 hours each night on 70% of the nights, I recommend, that you not skip any nights and use it throughout the night if you can. Getting used to CPAP and staying with the treatment long term does take time and patience and discipline. Untreated obstructive sleep apnea when it is moderate to severe can have an adverse impact on cardiovascular health and raise her risk for heart disease, arrhythmias, hypertension, congestive heart failure, stroke and diabetes. Untreated obstructive sleep apnea causes sleep disruption, nonrestorative sleep, and sleep deprivation. This can have an impact on your day to day functioning and cause daytime sleepiness and impairment of cognitive function, memory loss, mood disturbance, and problems focussing. Using CPAP regularly can improve these symptoms.  We will update supply orders, today. I would like for you to try to sleep on your side as much as possible.   Follow up in 1 year

## 2023-03-23 NOTE — Progress Notes (Unsigned)
PATIENT: Katherine Sanchez DOB: 06/29/1956  REASON FOR VISIT: follow up HISTORY FROM: patient PRIMARY NEUROLOGIST: Dr. Frances Furbish  HISTORY OF PRESENT ILLNESS:  03/23/23 ALL: Katherine Sanchez returns for follow up for OSA on CPAP.   03/18/2022 ALL: Katherine Sanchez returns for follow up for OSA on CPAP. She continues to do well on therapy. She is using her machine nightly for about 8 hours. She does not benefit of using CPAP. She has noticed a leak in her mask. She reports needing to change mask and headgear. She will monitor at home.      03/12/2021 MM: Katherine Sanchez is a 67 year old female with a history of obstructive sleep apnea on CPAP.  She returns today for follow-up.  She reports that the CPAP is working well.  She really enjoys her new CPAP.  She states occasionally during the night that pressure may feel too high but she does not want to adjust it at this time.  She returns today for an evaluation.      REVIEW OF SYSTEMS: Out of a complete 14 system review of symptoms, the patient complains only of the following symptoms, and all other reviewed systems are negative.   ESS 7  ALLERGIES: Allergies  Allergen Reactions   Amoxicillin     hives hives hives    HOME MEDICATIONS: Outpatient Medications Prior to Visit  Medication Sig Dispense Refill   albuterol (VENTOLIN HFA) 108 (90 Base) MCG/ACT inhaler Inhale 1-2 puffs into the lungs every 6 (six) hours as needed for wheezing or shortness of breath. 18 g 0   amLODipine-benazepril (LOTREL) 10-20 MG capsule Take 1 capsule by mouth daily. 90 capsule 3   Ascorbic Acid (VITAMIN C) POWD Take 1,000 mg by mouth daily.     aspirin 81 MG tablet Take 81 mg by mouth daily.     blood glucose meter kit and supplies KIT Dispense based on patient and insurance preference. Use up to one time as directed  (FOR ICD-9 250.00, 250.01). 1 each 0   docusate sodium (COLACE) 100 MG capsule Take 100 mg by mouth 2 (two) times daily as needed.     Dulaglutide  (TRULICITY) 1.5 MG/0.5ML SOPN Inject 1.5 mg into the skin once a week. 2 mL 7   fluticasone (FLONASE) 50 MCG/ACT nasal spray INSTILL 1 SPRAY INTO BOTH NOSTRILS DAILY 48 mL 1   glucose blood (ONE TOUCH ULTRA TEST) test strip USE UP TO 1 TIME AS DIRECTED 25 each 3   ibuprofen (ADVIL) 600 MG tablet Take 1 tablet (600 mg total) by mouth every 8 (eight) hours as needed. 21 tablet 0   levothyroxine (SYNTHROID) 50 MCG tablet Take one tablet by mouth daily 90 tablet 2   meloxicam (MOBIC) 15 MG tablet Take 1 tablet (15 mg total) by mouth daily as needed for pain. 90 tablet 0   metFORMIN (GLUCOPHAGE) 1000 MG tablet Take 1 tablet (1,000 mg total) by mouth 2 (two) times daily with a meal. 180 tablet 3   OneTouch Delica Lancets 33G MISC USE UP TO 1 TIME AS DIRECTED 100 each 11   No facility-administered medications prior to visit.    PAST MEDICAL HISTORY: Past Medical History:  Diagnosis Date   Abdominal distension    Allergy    per pt   Anxiety    Arthritis    knees    Constipation    Diabetes mellitus without complication (HCC)    GERD (gastroesophageal reflux disease)    Heart murmur  Hyperlipidemia    Hypertension    Hypothyroidism    Incisional hernia    Pneumonia    hx of 2006    Seasonal allergies    Sleep apnea    cpap x 10 yrs Dr Shelle Iron    Thyroid disease     PAST SURGICAL HISTORY: Past Surgical History:  Procedure Laterality Date   APPENDECTOMY  1982   CARDIAC CATHETERIZATION     1999 and negative   CHOLECYSTECTOMY  2010   lap with open redo umbilical hernia repair.  Dr. Zachery Dakins   COLONOSCOPY  11/18/2016   HERNIA REPAIR  2004   umbilical open w mesh plug. Dr. Lurene Shadow and 2010 Dr Zachery Dakins    KNEE SURGERY  2006   left - arthroscopic   VENTRAL HERNIA REPAIR  08/13/2011   Procedure: LAPAROSCOPIC VENTRAL HERNIA;  Surgeon: Ardeth Sportsman, MD;  Location: [facility];  Service: General;  Laterality: N/A;  Laparoscopic Ventral Hernia Repair with Mesh    FAMILY  HISTORY: Family History  Problem Relation Age of Onset   Cancer Mother        colon   Heart disease Mother 27       congestive heart failure   COPD Mother    Colon cancer Mother 28   Other Father 27       car accident   Cancer Maternal Grandmother        ovarian   Asthma Son    Allergies Son    Hypertension Paternal Grandmother    Cancer Paternal Grandfather        prostate   Breast cancer Neg Hx    Colon polyps Neg Hx    Esophageal cancer Neg Hx    Rectal cancer Neg Hx    Stomach cancer Neg Hx     SOCIAL HISTORY: Social History   Socioeconomic History   Marital status: Married    Spouse name: Not on file   Number of children: 2   Years of education: Not on file   Highest education level: Not on file  Occupational History   Occupation: Conservation officer, 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   Occupation: care taker  Tobacco Use   Smoking status: Former    Current packs/day: 0.00    Average packs/day: 0.1 packs/day for 4.0 years (0.4 ttl pk-yrs)    Types: Cigarettes    Start date: 07/07/1974    Quit date: 07/07/1978    Years since quitting: 44.7   Smokeless tobacco: Never   Tobacco comments:    occassional smoker with alcohol consumption  Vaping Use   Vaping status: Never Used  Substance and Sexual Activity   Alcohol use: No    Comment: quit 1979   Drug use: No   Sexual activity: Yes    Birth control/protection: None    Comment: number of sex partners in the last 12 months 1  Other Topics Concern   Not on file  Social History Narrative   Lives with husband and his mother is currently living with them after sustaining a fall in Oct. 2013.   Exercise walking 2 times daily   Social Determinants of Health   Financial Resource Strain: Not on file  Food Insecurity: Not on file  Transportation Needs: Not on file  Physical Activity: Not on file  Stress: Not on file  Social Connections: Not on file  Intimate Partner Violence: Not on file      PHYSICAL EXAM  There were  no vitals filed for this visit.  There is no height or weight on file to calculate BMI.  Generalized: Well developed, in no acute distress  Chest: Lungs clear to auscultation bilaterally  Neurological examination  Mentation: Alert oriented to time, place, history taking. Follows all commands speech and language fluent Cranial nerve II-XII: Extraocular movements were full, visual field were full on confrontational test Head turning and shoulder shrug  were normal and symmetric. Motor: The motor testing reveals 5 over 5 strength of all 4 extremities. Good symmetric motor tone is noted throughout.  Sensory: Sensory testing is intact to soft touch on all 4 extremities. No evidence of extinction is noted.  Gait and station: Gait is normal.    DIAGNOSTIC DATA (LABS, IMAGING, TESTING) - I reviewed patient records, labs, notes, testing and imaging myself where available.  Lab Results  Component Value Date   WBC 6.8 06/23/2022   HGB 13.9 06/23/2022   HCT 41.4 06/23/2022   MCV 85.8 06/23/2022   PLT 282.0 06/23/2022      Component Value Date/Time   NA 135 06/23/2022 1350   NA 139 12/14/2020 1937   K 4.1 06/23/2022 1350   CL 102 06/23/2022 1350   CO2 24 06/23/2022 1350   GLUCOSE 85 06/23/2022 1350   BUN 13 06/23/2022 1350   BUN 17 12/14/2020 1937   CREATININE 0.54 06/23/2022 1350   CREATININE 0.69 06/12/2016 1311   CALCIUM 9.7 06/23/2022 1350   PROT 7.9 06/23/2022 1350   PROT 7.6 12/14/2020 1937   ALBUMIN 4.4 06/23/2022 1350   ALBUMIN 4.5 12/14/2020 1937   AST 34 06/23/2022 1350   ALT 43 (H) 06/23/2022 1350   ALKPHOS 90 06/23/2022 1350   BILITOT 0.4 06/23/2022 1350   BILITOT 0.3 12/14/2020 1937   GFRNONAA 96 08/29/2020 1556   GFRNONAA >89 03/24/2015 1125   GFRAA 111 08/29/2020 1556   GFRAA >89 03/24/2015 1125   Lab Results  Component Value Date   CHOL 175 06/23/2022   HDL 46.40 06/23/2022   LDLCALC 101 (H) 06/23/2022   TRIG 138.0 06/23/2022   CHOLHDL 4 06/23/2022    Lab Results  Component Value Date   HGBA1C 5.5 12/22/2022   Lab Results  Component Value Date   VITAMINB12 181 (L) 02/22/2018   Lab Results  Component Value Date   TSH 3.22 06/23/2022      ASSESSMENT AND PLAN 67 y.o. year old female  has a past medical history of Abdominal distension, Allergy, Anxiety, Arthritis, Constipation, Diabetes mellitus without complication (HCC), GERD (gastroesophageal reflux disease), Heart murmur, Hyperlipidemia, Hypertension, Hypothyroidism, Incisional hernia, Pneumonia, Seasonal allergies, Sleep apnea, and Thyroid disease. here with:  OSA on CPAP  - CPAP compliance excellent - Good treatment of AHI  - continue to monitor leak at home  - Encourage patient to use CPAP nightly and > 4 hours each night - F/U in 1 year or sooner if needed    Shawnie Dapper, MSN, FNP-C 03/23/2023, 10:57 AM [facility] [address] [place], [place] [address] [phone]

## 2023-03-24 ENCOUNTER — Ambulatory Visit: Payer: Federal, State, Local not specified - PPO | Admitting: Family Medicine

## 2023-03-24 ENCOUNTER — Encounter: Payer: Self-pay | Admitting: Family Medicine

## 2023-03-24 VITALS — BP 138/75 | HR 62 | Ht 62.0 in | Wt 216.0 lb

## 2023-03-24 DIAGNOSIS — G4733 Obstructive sleep apnea (adult) (pediatric): Secondary | ICD-10-CM

## 2023-04-15 ENCOUNTER — Other Ambulatory Visit: Payer: Self-pay | Admitting: Emergency Medicine

## 2023-04-15 DIAGNOSIS — Z794 Long term (current) use of insulin: Secondary | ICD-10-CM

## 2023-04-18 DIAGNOSIS — G4733 Obstructive sleep apnea (adult) (pediatric): Secondary | ICD-10-CM | POA: Diagnosis not present

## 2023-05-10 ENCOUNTER — Telehealth: Payer: Self-pay | Admitting: Family Medicine

## 2023-05-10 ENCOUNTER — Encounter: Payer: Self-pay | Admitting: Family Medicine

## 2023-05-10 NOTE — Telephone Encounter (Signed)
Will you please attach 30 day report for review. TY!

## 2023-06-04 DIAGNOSIS — G4733 Obstructive sleep apnea (adult) (pediatric): Secondary | ICD-10-CM | POA: Diagnosis not present

## 2023-06-15 ENCOUNTER — Ambulatory Visit: Payer: Federal, State, Local not specified - PPO | Admitting: Emergency Medicine

## 2023-06-16 ENCOUNTER — Telehealth: Payer: Self-pay

## 2023-06-16 ENCOUNTER — Encounter: Payer: Self-pay | Admitting: Emergency Medicine

## 2023-06-16 ENCOUNTER — Other Ambulatory Visit (HOSPITAL_COMMUNITY): Payer: Self-pay

## 2023-06-16 ENCOUNTER — Ambulatory Visit: Payer: Federal, State, Local not specified - PPO | Admitting: Emergency Medicine

## 2023-06-16 VITALS — BP 130/78 | HR 70 | Temp 97.8°F | Ht 62.0 in | Wt 211.5 lb

## 2023-06-16 DIAGNOSIS — Z7984 Long term (current) use of oral hypoglycemic drugs: Secondary | ICD-10-CM | POA: Diagnosis not present

## 2023-06-16 DIAGNOSIS — I152 Hypertension secondary to endocrine disorders: Secondary | ICD-10-CM

## 2023-06-16 DIAGNOSIS — Z1211 Encounter for screening for malignant neoplasm of colon: Secondary | ICD-10-CM

## 2023-06-16 DIAGNOSIS — E1159 Type 2 diabetes mellitus with other circulatory complications: Secondary | ICD-10-CM

## 2023-06-16 DIAGNOSIS — E66812 Obesity, class 2: Secondary | ICD-10-CM | POA: Diagnosis not present

## 2023-06-16 DIAGNOSIS — Z6838 Body mass index (BMI) 38.0-38.9, adult: Secondary | ICD-10-CM

## 2023-06-16 DIAGNOSIS — Z23 Encounter for immunization: Secondary | ICD-10-CM | POA: Diagnosis not present

## 2023-06-16 DIAGNOSIS — G4733 Obstructive sleep apnea (adult) (pediatric): Secondary | ICD-10-CM

## 2023-06-16 DIAGNOSIS — E039 Hypothyroidism, unspecified: Secondary | ICD-10-CM

## 2023-06-16 LAB — POCT GLYCOSYLATED HEMOGLOBIN (HGB A1C): Hemoglobin A1C: 5.7 % — AB (ref 4.0–5.6)

## 2023-06-16 LAB — CBC WITH DIFFERENTIAL/PLATELET
Basophils Absolute: 0.1 10*3/uL (ref 0.0–0.1)
Basophils Relative: 1.1 % (ref 0.0–3.0)
Eosinophils Absolute: 0.2 10*3/uL (ref 0.0–0.7)
Eosinophils Relative: 3.9 % (ref 0.0–5.0)
HCT: 43.3 % (ref 36.0–46.0)
Hemoglobin: 14 g/dL (ref 12.0–15.0)
Lymphocytes Relative: 36.4 % (ref 12.0–46.0)
Lymphs Abs: 2.3 10*3/uL (ref 0.7–4.0)
MCHC: 32.4 g/dL (ref 30.0–36.0)
MCV: 87.9 fL (ref 78.0–100.0)
Monocytes Absolute: 0.5 10*3/uL (ref 0.1–1.0)
Monocytes Relative: 7.1 % (ref 3.0–12.0)
Neutro Abs: 3.3 10*3/uL (ref 1.4–7.7)
Neutrophils Relative %: 51.5 % (ref 43.0–77.0)
Platelets: 271 10*3/uL (ref 150.0–400.0)
RBC: 4.93 Mil/uL (ref 3.87–5.11)
RDW: 14 % (ref 11.5–15.5)
WBC: 6.4 10*3/uL (ref 4.0–10.5)

## 2023-06-16 LAB — LIPID PANEL
Cholesterol: 180 mg/dL (ref 0–200)
HDL: 53.5 mg/dL (ref >39.00)
LDL Cholesterol: 94 mg/dL (ref 0–99)
NonHDL: 126.66
Total CHOL/HDL Ratio: 3
Triglycerides: 163 mg/dL — ABNORMAL HIGH (ref 0.0–149.0)
VLDL: 32.6 mg/dL (ref 0.0–40.0)

## 2023-06-16 LAB — COMPREHENSIVE METABOLIC PANEL
ALT: 45 U/L — ABNORMAL HIGH (ref 0–35)
AST: 38 U/L — ABNORMAL HIGH (ref 0–37)
Albumin: 4.5 g/dL (ref 3.5–5.2)
Alkaline Phosphatase: 88 U/L (ref 39–117)
BUN: 15 mg/dL (ref 6–23)
CO2: 27 meq/L (ref 19–32)
Calcium: 9.5 mg/dL (ref 8.4–10.5)
Chloride: 103 meq/L (ref 96–112)
Creatinine, Ser: 0.59 mg/dL (ref 0.40–1.20)
GFR: 93.54 mL/min (ref >60.00)
Glucose, Bld: 93 mg/dL (ref 70–99)
Potassium: 4.1 meq/L (ref 3.5–5.1)
Sodium: 136 meq/L (ref 135–145)
Total Bilirubin: 0.5 mg/dL (ref 0.2–1.2)
Total Protein: 7.6 g/dL (ref 6.0–8.3)

## 2023-06-16 LAB — MICROALBUMIN / CREATININE URINE RATIO
Creatinine,U: 62.1 mg/dL
Microalb Creat Ratio: 1.1 mg/g (ref 0.0–30.0)
Microalb, Ur: <0.7 mg/dL (ref 0.0–1.9)

## 2023-06-16 MED ORDER — AMLODIPINE BESY-BENAZEPRIL HCL 10-20 MG PO CAPS: 1.0000 | ORAL_CAPSULE | Freq: Every day | ORAL | 3 refills | Status: DC

## 2023-06-16 MED ORDER — METFORMIN HCL 1000 MG PO TABS: 1000.0000 mg | ORAL_TABLET | Freq: Two times a day (BID) | ORAL | 3 refills | Status: DC

## 2023-06-16 MED ORDER — ONETOUCH DELICA LANCETS 33G MISC: 11 refills | Status: AC

## 2023-06-16 MED ORDER — GLUCOSE BLOOD VI STRP: ORAL_STRIP | 3 refills | Status: AC

## 2023-06-16 NOTE — Assessment & Plan Note (Signed)
Clinically euthyroid. TSH done today Continue Synthroid 50 mcg daily.

## 2023-06-16 NOTE — Assessment & Plan Note (Signed)
Stable and well-controlled on CPAP treatment.

## 2023-06-16 NOTE — Progress Notes (Signed)
Katherine Sanchez 67 y.o.   Chief Complaint  Patient presents with   Medical Management of Chronic Issues    f/u appt, no concerns     HISTORY OF PRESENT ILLNESS: This is a 67 y.o. female A1A here for 76-month follow-up of chronic medical conditions Overall doing well.  Has no complaints or medical concerns today. BP Readings from Last 3 Encounters:  03/24/23 138/75  12/22/22 132/78  06/23/22 138/82   Lab Results  Component Value Date   HGBA1C 5.5 12/22/2022   Wt Readings from Last 3 Encounters:  06/16/23 211 lb 8 oz (95.9 kg)  03/24/23 216 lb (98 kg)  12/22/22 216 lb 6 oz (98.1 kg)     HPI   Prior to Admission medications   Medication Sig Start Date End Date Taking? Authorizing Provider  albuterol (VENTOLIN HFA) 108 (90 Base) MCG/ACT inhaler Inhale 1-2 puffs into the lungs every 6 (six) hours as needed for wheezing or shortness of breath. 10/09/21  Yes Lamptey, Britta Mccreedy, MD  amLODipine-benazepril (LOTREL) 10-20 MG capsule Take 1 capsule by mouth daily. 06/23/22  Yes Ursala Cressy, Eilleen Kempf, MD  Ascorbic Acid (VITAMIN C) POWD Take 1,000 mg by mouth daily.   Yes [provider]  aspirin 81 MG tablet Take 81 mg by mouth daily.   Yes [provider]  blood glucose meter kit and supplies KIT Dispense based on patient and insurance preference. Use up to one time as directed  (FOR ICD-9 250.00, 250.01). 03/24/15  Yes Le, [name], DO  docusate sodium (COLACE) 100 MG capsule Take 100 mg by mouth 2 (two) times daily as needed.   Yes [provider]  Dulaglutide (TRULICITY) 1.5 MG/0.5ML SOPN Inject 1.5 mg into the skin once a week. 12/22/22  Yes Fredy Gladu, Eilleen Kempf, MD  fluticasone [facility]) 50 MCG/ACT nasal spray INSTILL 1 SPRAY INTO BOTH NOSTRILS DAILY 11/09/22  Yes Hartleigh Edmonston, Cren[place], MD  glucose blood (ONE TOUCH ULTRA TEST) test strip USE UP TO 1 TIME AS DIRECTED 04/27/19  Yes Toshika Parrow, Eilleen Kempf, MD  ibuprofen (ADVIL) 600 MG tablet Take 1 tablet  (600 mg total) by mouth every 8 (eight) hours as needed. 10/09/21  Yes Lamptey, Britta Mccreedy, MD  levothyroxine (SYNTHROID) 50 MCG tablet Take one tablet by mouth daily 08/14/22  Yes Deyonna Fitzsimmons, Eilleen Kempf, MD  meloxicam (MOBIC) 15 MG tablet Take 1 tablet (15 mg total) by mouth daily as needed for pain. 08/05/16  Yes Sherren Mocha, MD  metFORMIN (GLUCOPHAGE) 1000 MG tablet Take 1 tablet (1,000 mg total) by mouth 2 (two) times daily with a meal. 06/23/22  Yes Yaritza Leist, Eilleen Kempf, MD  OneTouch Delica Lancets 33G MISC USE UP TO 1 TIME AS DIRECTED 12/26/21  Yes Georgina Quint, MD    Allergies  Allergen Reactions   Amoxicillin     hives hives hives    Patient Active Problem List   Diagnosis Date Noted   Class 2 severe obesity due to excess calories with serious comorbidity and body mass index (BMI) of 38.0 to 38.9 in adult (HCC) 03/26/2020   Statin intolerance 03/26/2020   Vitamin B12 deficiency 02/22/2018   Type 2 diabetes mellitus without complication, with long-term current use of insulin (HCC) 06/22/2017   Acquired hypothyroidism 06/22/2017   OSA (obstructive sleep apnea) 06/28/2013   Obesity (BMI 30-39.9) 07/08/2011   Essential hypertension, benign 10/17/2007   Chronic ischemic heart disease 10/17/2007   Hypertension associated with type 2 diabetes mellitus (HCC) 10/14/2007  Recurrent ventral incisional hernia, periumbilical 10/14/2007   Osteoarthritis 10/14/2007   ABNORMAL GLUCOSE NEC 10/14/2007    Past Medical History:  Diagnosis Date   Abdominal distension    Allergy    per pt   Anxiety    Arthritis    knees    Constipation    Diabetes mellitus without complication (HCC)    GERD (gastroesophageal reflux disease)    Heart murmur    Hyperlipidemia    Hypertension    Hypothyroidism    Incisional hernia    Pneumonia    hx of 2006    Seasonal allergies    Sleep apnea    cpap x 10 yrs Dr Shelle Iron    Thyroid disease     Past Surgical History:  Procedure Laterality  Date   APPENDECTOMY  1982   CARDIAC CATHETERIZATION     1999 and negative   CHOLECYSTECTOMY  2010   lap with open redo umbilical hernia repair.  Dr. Zachery Dakins   COLONOSCOPY  11/18/2016   HERNIA REPAIR  2004   umbilical open w mesh plug. Dr. 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   Occupation: care taker  Tobacco Use   Smoking status: Former    Current packs/day: 0.00    Average packs/day: 0.1 packs/day for 4.0 years (0.4 ttl pk-yrs)    Types: Cigarettes    Start date: 07/07/1974    Quit date: 07/07/1978    Years since quitting: 44.9   Smokeless tobacco: Never   Tobacco comments:    occassional smoker with alcohol consumption  Vaping Use   Vaping status: Never Used  Substance and Sexual Activity   Alcohol use: No    Comment: quit 1979   Drug use: No   Sexual activity: Yes    Birth control/protection: None    Comment: number of sex partners in the last 12 months 1  Other Topics Concern   Not on file  Social History Narrative   Lives with husband and his mother is currently living with them after sustaining a fall in Oct. 2013.   Exercise walking 2 times daily   Social Determinants of Health   Financial Resource Strain: Not on file  Food Insecurity: Not on file  Transportation Needs: Not on file  Physical Activity: Not on file  Stress: Not on file  Social Connections: Not on file  Intimate Partner Violence: Not on file    Family History  Problem Relation  Age of Onset   Cancer Mother        colon   Heart disease Mother 68       congestive heart failure   COPD Mother    Colon cancer Mother 34   Other Father 24       car accident   Cancer Maternal Grandmother        ovarian   Asthma Son    Allergies Son  Hypertension Paternal Grandmother    Cancer Paternal Grandfather        prostate   Breast cancer Neg Hx    Colon polyps Neg Hx    Esophageal cancer Neg Hx    Rectal cancer Neg Hx    Stomach cancer Neg Hx      Review of Systems  Constitutional: Negative.  Negative for chills and fever.  HENT: Negative.  Negative for congestion and sore throat.   Respiratory: Negative.  Negative for cough and shortness of breath.   Cardiovascular: Negative.  Negative for chest pain and palpitations.  Gastrointestinal:  Negative for abdominal pain, diarrhea, nausea and vomiting.  Genitourinary: Negative.  Negative for dysuria and hematuria.  Skin: Negative.  Negative for rash.  Neurological: Negative.  Negative for dizziness and headaches.  All other systems reviewed and are negative.   Today's Vitals   06/16/23 1451  BP: 130/78  Pulse: 70  Temp: 97.8 F (36.6 C)  TempSrc: Oral  SpO2: 98%  Weight: 211 lb 8 oz (95.9 kg)  Height: 5\' 2"  (1.575 m)   Body mass index is 38.68 kg/m.   Physical Exam Vitals reviewed.  Constitutional:      Appearance: Normal appearance.  HENT:     Head: Normocephalic.     Mouth/Throat:     Mouth: Mucous membranes are moist.     Pharynx: Oropharynx is clear.  Eyes:     Extraocular Movements: Extraocular movements intact.     Pupils: Pupils are equal, round, and reactive to light.  Cardiovascular:     Rate and Rhythm: Normal rate and regular rhythm.     Pulses: Normal pulses.     Heart sounds: Murmur heard.  Pulmonary:     Effort: Pulmonary effort is normal.     Breath sounds: Normal breath sounds.  Abdominal:     Palpations: Abdomen is soft.     Tenderness: There is no abdominal tenderness.   Musculoskeletal:     Cervical back: No tenderness.     Right lower leg: No edema.     Left lower leg: No edema.  Lymphadenopathy:     Cervical: No cervical adenopathy.  Skin:    General: Skin is warm and dry.  Neurological:     General: No focal deficit present.     Mental Status: She is alert and oriented to person, place, and time.  Psychiatric:        Mood and Affect: Mood normal.        Behavior: Behavior normal.    Results for orders placed or performed in visit on 06/16/23 (from the past 24 hour(s))  POCT HgB A1C     Status: Abnormal   Collection Time: 06/16/23  3:33 PM  Result Value Ref Range   Hemoglobin A1C 5.7 (A) 4.0 - 5.6 %   HbA1c POC (<> result, manual entry)     HbA1c, POC (prediabetic range)     HbA1c, POC (controlled diabetic range)       ASSESSMENT & PLAN: A total of 45 minutes was spent with the patient and counseling/coordination of care regarding preparing for this visit, review of most recent office notes, review of multiple chronic medical conditions under management, review of most recent blood work results including interpretation of today's hemoglobin A1c, review of all medications, cardiovascular risks associated with hypertension and diabetes, education on nutrition, prognosis, documentation, and need for follow-up.  Problem List Items Addressed This Visit       Cardiovascular and Mediastinum  Hypertension associated with type 2 diabetes mellitus (HCC) - Primary    BP Readings from Last 3 Encounters:  06/16/23 130/78  03/24/23 138/75  12/22/22 132/78   Lab Results  Component Value Date   HGBA1C 5.7 (A) 06/16/2023   Well-controlled hypertension and diabetes with hemoglobin A1c of 5.7 Cardiovascular risks associated with hypertension and diabetes discussed Diet and nutrition discussed Continue Lotrel 10-20 mg daily Continue weekly Trulicity 1.5 mg and metformin 1000 mg twice a day Blood work done today Intolerant to statins       Relevant Medications   amLODipine-benazepril (LOTREL) 10-20 MG capsule   metFORMIN (GLUCOPHAGE) 1000 MG tablet   glucose blood (ONE TOUCH ULTRA TEST) test strip   OneTouch Delica Lancets 33G MISC   Other Relevant Orders   POCT HgB A1C (Completed)   Urine Microalbumin w/creat. ratio   CBC with Differential/Platelet   Comprehensive metabolic panel   Lipid panel     Respiratory   OSA (obstructive sleep apnea)    Stable and well-controlled on CPAP treatment        Endocrine   Acquired hypothyroidism    Clinically euthyroid. TSH done today. Continue Synthroid 50 mcg daily      Relevant Orders   TSH     Other   Class 2 severe obesity due to excess calories with serious comorbidity and body mass index (BMI) of 38.0 to 38.9 in adult [facility])    Eating better and losing weight Continue weekly Trulicity 1.5 mg Diet and nutrition discussed Benefits of exercise discussed Advised to decrease amount of daily carbohydrate intake and daily calories and increase amount of plant-based protein in her diet      Relevant Medications   metFORMIN (GLUCOPHAGE) 1000 MG tablet   Other Visit Diagnoses     Need for vaccination       Relevant Orders   Flu Vaccine Trivalent High Dose (Fluad) (Completed)   Zoster Recombinant (Shingrix ) (Completed)   Screening for colon cancer       Relevant Orders   Ambulatory referral to Gastroenterology        Patient Instructions  Health Maintenance After Age 82 After age 26, you are at a higher risk for certain long-term diseases and infections as well as injuries from falls. Falls are a major cause of broken bones and head injuries in people who are older than age 88. Getting regular preventive care can help to keep you healthy and well. Preventive care includes getting regular testing and making lifestyle changes as recommended by your health care provider. Talk with your health care provider about: Which screenings and tests you should have. A  screening is a test that checks for a disease when you have no symptoms. A diet and exercise plan that is right for you. What should I know about screenings and tests to prevent falls? Screening and testing are the best ways to find a health problem early. Early diagnosis and treatment give you the best chance of managing medical conditions that are common after age 2. Certain conditions and lifestyle choices may make you more likely to have a fall. Your health care provider may recommend: Regular vision checks. Poor vision and conditions such as cataracts can make you more likely to have a fall. If you wear glasses, make sure to get your prescription updated if your vision changes. Medicine review. Work with your health care provider to regularly review all of the medicines you are taking, including over-the-counter medicines. Ask your health  care provider about any side effects that may make you more likely to have a fall. Tell your health care provider if any medicines that you take make you feel dizzy or sleepy. Strength and balance checks. Your health care provider may recommend certain tests to check your strength and balance while standing, walking, or changing positions. Foot health exam. Foot pain and numbness, as well as not wearing proper footwear, can make you more likely to have a fall. Screenings, including: Osteoporosis screening. Osteoporosis is a condition that causes the bones to get weaker and break more easily. Blood pressure screening. Blood pressure changes and medicines to control blood pressure can make you feel dizzy. Depression screening. You may be more likely to have a fall if you have a fear of falling, feel depressed, or feel unable to do activities that you used to do. Alcohol use screening. Using too much alcohol can affect your balance and may make you more likely to have a fall. Follow these instructions at home: Lifestyle Do not drink alcohol if: Your health care  provider tells you not to drink. If you drink alcohol: Limit how much you have to: 0-1 drink a day for women. 0-2 drinks a day for men. Know how much alcohol is in your drink. In the [place] one drink equals one 12 oz bottle of beer (355 mL), one 5 oz glass of wine (148 mL), or one 1 oz glass of hard liquor (44 mL). Do not use any products that contain nicotine or tobacco. These products include cigarettes, chewing tobacco, and vaping devices, such as e-cigarettes. If you need help quitting, ask your health care provider. Activity  Follow a regular exercise program to stay fit. This will help you maintain your balance. Ask your health care provider what types of exercise are appropriate for you. If you need a cane or walker, use it as recommended by your health care provider. Wear supportive shoes that have nonskid soles. Safety  Remove any tripping hazards, such as rugs, cords, and clutter. Install safety equipment such as grab bars in bathrooms and safety rails on stairs. Keep rooms and walkways well-lit. General instructions Talk with your health care provider about your risks for falling. Tell your health care provider if: You fall. Be sure to tell your health care provider about all falls, even ones that seem minor. You feel dizzy, tiredness (fatigue), or off-balance. Take over-the-counter and prescription medicines only as told by your health care provider. These include supplements. Eat a healthy diet and maintain a healthy weight. A healthy diet includes low-fat dairy products, low-fat (lean) meats, and fiber from whole grains, beans, and lots of fruits and vegetables. Stay current with your vaccines. Schedule regular health, dental, and eye exams. Summary Having a healthy lifestyle and getting preventive care can help to protect your health and wellness after age 53. Screening and testing are the best way to find a health problem early and help you avoid having a fall. Early  diagnosis and treatment give you the best chance for managing medical conditions that are more common for people who are older than age 69. Falls are a major cause of broken bones and head injuries in people who are older than age 26. Take precautions to prevent a fall at home. Work with your health care provider to learn what changes you can make to improve your health and wellness and to prevent falls. This information is not intended to replace advice given to you by your health  care provider. Make sure you discuss any questions you have with your health care provider. Document Revised: 12/30/2020 Document Reviewed: 12/30/2020 Elsevier Patient Education  2024 Elsevier Inc.      Edwina Barth, MD [facility]

## 2023-06-16 NOTE — Telephone Encounter (Signed)
Pharmacy Patient Advocate Encounter   Received notification from CoverMyMeds that prior authorization for Trulicity is required/requested.   Insurance verification completed.   The patient is insured through [facility] .   Per test claim: PA required; PA submitted to [facility] via CoverMyMeds Key/confirmation #/EOC Key: [id number] Status is pending

## 2023-06-16 NOTE — Assessment & Plan Note (Signed)
Eating better and losing weight Continue weekly Trulicity 1.5 mg Diet and nutrition discussed Benefits of exercise discussed Advised to decrease amount of daily carbohydrate intake and daily calories and increase amount of plant-based protein in her diet

## 2023-06-16 NOTE — Patient Instructions (Signed)
Health Maintenance After Age 67 After age 67, you are at a higher risk for certain long-term diseases and infections as well as injuries from falls. Falls are a major cause of broken bones and head injuries in people who are older than age 67. Getting regular preventive care can help to keep you healthy and well. Preventive care includes getting regular testing and making lifestyle changes as recommended by your health care provider. Talk with your health care provider about: Which screenings and tests you should have. A screening is a test that checks for a disease when you have no symptoms. A diet and exercise plan that is right for you. What should I know about screenings and tests to prevent falls? Screening and testing are the best ways to find a health problem early. Early diagnosis and treatment give you the best chance of managing medical conditions that are common after age 67. Certain conditions and lifestyle choices may make you more likely to have a fall. Your health care provider may recommend: Regular vision checks. Poor vision and conditions such as cataracts can make you more likely to have a fall. If you wear glasses, make sure to get your prescription updated if your vision changes. Medicine review. Work with your health care provider to regularly review all of the medicines you are taking, including over-the-counter medicines. Ask your health care provider about any side effects that may make you more likely to have a fall. Tell your health care provider if any medicines that you take make you feel dizzy or sleepy. Strength and balance checks. Your health care provider may recommend certain tests to check your strength and balance while standing, walking, or changing positions. Foot health exam. Foot pain and numbness, as well as not wearing proper footwear, can make you more likely to have a fall. Screenings, including: Osteoporosis screening. Osteoporosis is a condition that causes  the bones to get weaker and break more easily. Blood pressure screening. Blood pressure changes and medicines to control blood pressure can make you feel dizzy. Depression screening. You may be more likely to have a fall if you have a fear of falling, feel depressed, or feel unable to do activities that you used to do. Alcohol use screening. Using too much alcohol can affect your balance and may make you more likely to have a fall. Follow these instructions at home: Lifestyle Do not drink alcohol if: Your health care provider tells you not to drink. If you drink alcohol: Limit how much you have to: 0-1 drink a day for women. 0-2 drinks a day for men. Know how much alcohol is in your drink. In the [place] one drink equals one 12 oz bottle of beer (355 mL), one 5 oz glass of wine (148 mL), or one 1 oz glass of hard liquor (44 mL). Do not use any products that contain nicotine or tobacco. These products include cigarettes, chewing tobacco, and vaping devices, such as e-cigarettes. If you need help quitting, ask your health care provider. Activity  Follow a regular exercise program to stay fit. This will help you maintain your balance. Ask your health care provider what types of exercise are appropriate for you. If you need a cane or walker, use it as recommended by your health care provider. Wear supportive shoes that have nonskid soles. Safety  Remove any tripping hazards, such as rugs, cords, and clutter. Install safety equipment such as grab bars in bathrooms and safety rails on stairs. Keep rooms and walkways   well-lit. General instructions Talk with your health care provider about your risks for falling. Tell your health care provider if: You fall. Be sure to tell your health care provider about all falls, even ones that seem minor. You feel dizzy, tiredness (fatigue), or off-balance. Take over-the-counter and prescription medicines only as told by your health care provider. These include  supplements. Eat a healthy diet and maintain a healthy weight. A healthy diet includes low-fat dairy products, low-fat (lean) meats, and fiber from whole grains, beans, and lots of fruits and vegetables. Stay current with your vaccines. Schedule regular health, dental, and eye exams. Summary Having a healthy lifestyle and getting preventive care can help to protect your health and wellness after age 67. Screening and testing are the best way to find a health problem early and help you avoid having a fall. Early diagnosis and treatment give you the best chance for managing medical conditions that are more common for people who are older than age 67. Falls are a major cause of broken bones and head injuries in people who are older than age 67. Take precautions to prevent a fall at home. Work with your health care provider to learn what changes you can make to improve your health and wellness and to prevent falls. This information is not intended to replace advice given to you by your health care provider. Make sure you discuss any questions you have with your health care provider. Document Revised: 12/30/2020 Document Reviewed: 12/30/2020 Elsevier Patient Education  2024 Elsevier Inc.  

## 2023-06-16 NOTE — Assessment & Plan Note (Signed)
BP Readings from Last 3 Encounters:  06/16/23 130/78  03/24/23 138/75  12/22/22 132/78   Lab Results  Component Value Date   HGBA1C 5.7 (A) 06/16/2023   Well-controlled hypertension and diabetes with hemoglobin A1c of 5.7 Cardiovascular risks associated with hypertension and diabetes discussed Diet and nutrition discussed Continue Lotrel 10-20 mg daily Continue weekly Trulicity 1.5 mg and metformin 1000 mg twice a day Blood work done today Intolerant to statins

## 2023-06-17 NOTE — Telephone Encounter (Signed)
Pharmacy Patient Advocate Encounter  Received notification from [facility] that Prior Authorization for Trulicity 1.5MG /0.5ML auto-injectors has been APPROVED from 05/17/2023 to 06/15/2024   PA #/Case ID/Reference #:  [id number]

## 2023-07-05 DIAGNOSIS — G4733 Obstructive sleep apnea (adult) (pediatric): Secondary | ICD-10-CM | POA: Diagnosis not present

## 2023-07-07 ENCOUNTER — Other Ambulatory Visit: Payer: Self-pay | Admitting: Emergency Medicine

## 2023-07-07 DIAGNOSIS — Z794 Long term (current) use of insulin: Secondary | ICD-10-CM

## 2023-08-04 DIAGNOSIS — G4733 Obstructive sleep apnea (adult) (pediatric): Secondary | ICD-10-CM | POA: Diagnosis not present

## 2023-09-02 ENCOUNTER — Other Ambulatory Visit: Payer: Self-pay | Admitting: Emergency Medicine

## 2023-09-02 ENCOUNTER — Other Ambulatory Visit: Payer: Self-pay | Admitting: Radiology

## 2023-09-02 DIAGNOSIS — E1159 Type 2 diabetes mellitus with other circulatory complications: Secondary | ICD-10-CM

## 2023-09-02 MED ORDER — TRULICITY 1.5 MG/0.5ML ~~LOC~~ SOAJ: 1.5000 mg | SUBCUTANEOUS | 7 refills | Status: DC

## 2023-09-04 ENCOUNTER — Other Ambulatory Visit: Payer: Self-pay | Admitting: Emergency Medicine

## 2023-12-15 ENCOUNTER — Ambulatory Visit: Payer: Federal, State, Local not specified - PPO | Admitting: Emergency Medicine

## 2024-01-10 ENCOUNTER — Other Ambulatory Visit: Payer: Self-pay | Admitting: Emergency Medicine

## 2024-01-10 DIAGNOSIS — E039 Hypothyroidism, unspecified: Secondary | ICD-10-CM

## 2024-01-20 ENCOUNTER — Ambulatory Visit: Admitting: Emergency Medicine

## 2024-01-20 ENCOUNTER — Encounter: Payer: Self-pay | Admitting: Emergency Medicine

## 2024-01-20 VITALS — BP 126/84 | HR 71 | Temp 98.4°F | Ht 62.0 in | Wt 223.0 lb

## 2024-01-20 DIAGNOSIS — E66812 Obesity, class 2: Secondary | ICD-10-CM

## 2024-01-20 DIAGNOSIS — E1159 Type 2 diabetes mellitus with other circulatory complications: Secondary | ICD-10-CM

## 2024-01-20 DIAGNOSIS — I259 Chronic ischemic heart disease, unspecified: Secondary | ICD-10-CM | POA: Diagnosis not present

## 2024-01-20 DIAGNOSIS — E119 Type 2 diabetes mellitus without complications: Secondary | ICD-10-CM

## 2024-01-20 DIAGNOSIS — Z794 Long term (current) use of insulin: Secondary | ICD-10-CM

## 2024-01-20 DIAGNOSIS — G4733 Obstructive sleep apnea (adult) (pediatric): Secondary | ICD-10-CM | POA: Diagnosis not present

## 2024-01-20 DIAGNOSIS — I152 Hypertension secondary to endocrine disorders: Secondary | ICD-10-CM

## 2024-01-20 DIAGNOSIS — Z6838 Body mass index (BMI) 38.0-38.9, adult: Secondary | ICD-10-CM

## 2024-01-20 DIAGNOSIS — E039 Hypothyroidism, unspecified: Secondary | ICD-10-CM

## 2024-01-20 LAB — POCT GLYCOSYLATED HEMOGLOBIN (HGB A1C): Hemoglobin A1C: 6 % — AB (ref 4.0–5.6)

## 2024-01-20 NOTE — Assessment & Plan Note (Signed)
Stable and well-controlled on CPAP treatment.

## 2024-01-20 NOTE — Assessment & Plan Note (Signed)
 BP Readings from Last 3 Encounters:  01/20/24 126/84  06/16/23 130/78  03/24/23 138/75  Well-controlled hypertension and diabetes with hemoglobin A1c of 6.0 Cardiovascular risks associated with hypertension and diabetes discussed Diet and nutrition discussed Continue Lotrel 10-20 mg daily Continue weekly Trulicity  1.5 mg and metformin  1000 mg twice a day Intolerant to statin Follow-up in 6 months

## 2024-01-20 NOTE — Progress Notes (Signed)
 Katherine Sanchez 69 y.o.   Chief Complaint  Patient presents with  . Follow-up    6 month f/u HTN / DM. Patient states no other concerns    HISTORY OF PRESENT ILLNESS: This is a 68 y.o. female here for 52-month follow-up of chronic medical conditions including hypertension and diabetes Has been under increased amount of stress due to family illnesses. No other complaints or medical concerns today. Lab Results  Component Value Date   HGBA1C 5.7 (A) 06/16/2023   Wt Readings from Last 3 Encounters:  01/20/24 223 lb (101.2 kg)  06/16/23 211 lb 8 oz (95.9 kg)  03/24/23 216 lb (98 kg)   BP Readings from Last 3 Encounters:  01/20/24 126/84  06/16/23 130/78  03/24/23 138/75     HPI   Prior to Admission medications   Medication Sig Start Date End Date Taking? Authorizing Provider  albuterol  (VENTOLIN  HFA) 108 (90 Base) MCG/ACT inhaler Inhale 1-2 puffs into the lungs every 6 (six) hours as needed for wheezing or shortness of breath. 10/09/21  Yes Lamptey, Donley Furth, MD  amLODipine -benazepril  (LOTREL) 10-20 MG capsule Take 1 capsule by mouth daily. 06/16/23  Yes Severino Paolo, Isidro Margo, MD  Ascorbic Acid  (VITAMIN C ) POWD Take 1,000 mg by mouth daily.   Yes [provider]  aspirin 81 MG tablet Take 81 mg by mouth daily.   Yes [provider]  blood glucose meter kit and supplies KIT Dispense based on patient and insurance preference. Use up to one time as directed  (FOR ICD-9 250.00, 250.01). 03/24/15  Yes Le, [name], DO  docusate sodium (COLACE) 100 MG capsule Take 100 mg by mouth 2 (two) times daily as needed.   Yes [provider]  Dulaglutide  (TRULICITY ) 1.5 MG/0.5ML SOAJ Inject 1.5 mg into the skin once a week. 09/02/23  Yes Rodriguez Aguinaldo, Isidro Margo, MD  fluticasone  (FLONASE ) 50 MCG/ACT nasal spray SPRAY 1 SPRAY INTO EACH NOSTRIL DAILY 09/04/23  Yes Lindsey Demonte Jose, MD  glucose blood (ONE TOUCH ULTRA TEST) test strip USE UP TO 1 TIME AS DIRECTED 06/16/23   Yes Momoka Stringfield, Isidro Margo, MD  ibuprofen  (ADVIL ) 600 MG tablet Take 1 tablet (600 mg total) by mouth every 8 (eight) hours as needed. 10/09/21  Yes Lamptey, Donley Furth, MD  levothyroxine  (SYNTHROID ) 50 MCG tablet TAKE 1 TABLET BY MOUTH EVERY DAY 01/10/24  Yes Dev Dhondt, Isidro Margo, MD  meloxicam  (MOBIC ) 15 MG tablet Take 1 tablet (15 mg total) by mouth daily as needed for pain. 08/05/16  Yes Cranston Dk, MD  metFORMIN  (GLUCOPHAGE ) 1000 MG tablet Take 1 tablet (1,000 mg total) by mouth 2 (two) times daily with a meal. 06/16/23  Yes Samanda Buske, Isidro Margo, MD  OneTouch Delica Lancets 33G MISC USE UP TO 1 TIME AS DIRECTED 06/16/23  Yes Elvira Hammersmith, MD    Allergies  Allergen Reactions  . Amoxicillin     hives hives hives    Patient Active Problem List   Diagnosis Date Noted  . Class 2 severe obesity due to excess calories with serious comorbidity and body mass index (BMI) of 38.0 to 38.9 in adult (HCC) 03/26/2020  . Statin intolerance 03/26/2020  . Vitamin B12 deficiency 02/22/2018  . Type 2 diabetes mellitus without complication, with long-term current use of insulin (HCC) 06/22/2017  . Acquired hypothyroidism 06/22/2017  . OSA (obstructive sleep apnea) 06/28/2013  . Obesity (BMI 30-39.9) 07/08/2011  . Essential hypertension, benign 10/17/2007  . Chronic ischemic heart disease 10/17/2007  .  Hypertension associated with type 2 diabetes mellitus (HCC) 10/14/2007  . Recurrent ventral incisional hernia, periumbilical 10/14/2007  . Osteoarthritis 10/14/2007  . ABNORMAL GLUCOSE NEC 10/14/2007    Past Medical History:  Diagnosis Date  . Abdominal distension   . Allergy    per pt  . Anxiety   . Arthritis    knees   . Constipation   . Diabetes mellitus without complication (HCC)   . GERD (gastroesophageal reflux disease)   . Heart murmur   . Hyperlipidemia   . Hypertension   . Hypothyroidism   . Incisional hernia   . Pneumonia    hx of 2006   . Seasonal allergies   .  Sleep apnea    cpap x 10 yrs Dr Bennetta Braun   . Thyroid  disease     Past Surgical History:  Procedure Laterality Date  . APPENDECTOMY  1982  . CARDIAC CATHETERIZATION     1999 and negative  . CHOLECYSTECTOMY  2010   lap with open redo umbilical hernia repair.  Dr. Toniann Franklin  . COLONOSCOPY  11/18/2016  . HERNIA REPAIR  2004   umbilical open w mesh plug. Dr. Ballen and 2010 Dr Toniann Franklin   . KNEE SURGERY  2006   left - arthroscopic  . VENTRAL HERNIA REPAIR  08/13/2011   Procedure: LAPAROSCOPIC VENTRAL HERNIA;  Surgeon: Eddye Goodie, MD;  Location: [facility];  Service: General;  Laterality: N/A;  Laparoscopic Ventral Hernia Repair with Mesh    Social History   Socioeconomic History  . Marital status: Married    Spouse name: Not on file  . Number of children: 2  . Years of education: Not on file  . Highest education level: Not on file  Occupational History  . 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 Occupation: care taker  Tobacco Use  . Smoking status: Former    Current packs/day: 0.00    Average packs/day: 0.1 packs/day for 4.0 years (0.4 ttl pk-yrs)    Types: Cigarettes    Start date: 07/07/1974    Quit date: 07/07/1978    Years since quitting: 45.5  . Smokeless tobacco: Never  . Tobacco comments:    occassional smoker with alcohol consumption  Vaping Use  . Vaping status: Never Used  Substance and Sexual Activity  . Alcohol use: No    Comment: quit 1979  . Drug use: No  . Sexual activity: Yes    Birth control/protection: None    Comment: number of sex partners in the last 12 months 1  Other Topics Concern  . Not on file  Social History Narrative   Lives with husband and his mother is currently living with them after sustaining a fall in Oct. 2013.   Exercise walking 2 times daily   Social Drivers of Health   Financial Resource Strain: Not on file  Food Insecurity: Not on file  Transportation Needs: Not on file  Physical Activity: Not on file   Stress: Not on file  Social Connections: Not on file  Intimate Partner Violence: Not on file    Family History  Problem Relation Age of Onset  . Cancer Mother        colon  . Heart disease Mother 75       congestive heart failure  . COPD Mother   . Colon cancer Mother 45  . Other Father 106       car accident  . Cancer Maternal Grandmother        ovarian  .  Asthma Son   . Allergies Son   . Hypertension Paternal Grandmother   . Cancer Paternal Grandfather        prostate  . Breast cancer Neg Hx   . Colon polyps Neg Hx   . Esophageal cancer Neg Hx   . Rectal cancer Neg Hx   . Stomach cancer Neg Hx      Review of Systems  Constitutional: Negative.  Negative for fever.  HENT: Negative.  Negative for congestion and sore throat.   Respiratory: Negative.  Negative for cough and shortness of breath.   Cardiovascular: Negative.  Negative for chest pain and palpitations.  Gastrointestinal:  Negative for abdominal pain, diarrhea, nausea and vomiting.  Genitourinary: Negative.  Negative for dysuria and hematuria.  Skin: Negative.  Negative for rash.  Neurological: Negative.  Negative for dizziness and headaches.  All other systems reviewed and are negative.   Vitals:   01/20/24 1103  BP: 126/84  Pulse: 71  Temp: 98.4 F (36.9 C)  SpO2: 99%    Physical Exam Vitals reviewed.  Constitutional:      Appearance: Normal appearance.  HENT:     Head: Normocephalic.  Eyes:     Extraocular Movements: Extraocular movements intact.  Cardiovascular:     Rate and Rhythm: Normal rate and regular rhythm.     Pulses: Normal pulses.     Heart sounds: Normal heart sounds.  Pulmonary:     Effort: Pulmonary effort is normal.     Breath sounds: Normal breath sounds.  Skin:    General: Skin is warm and dry.     Capillary Refill: Capillary refill takes less than 2 seconds.  Neurological:     General: No focal deficit present.     Mental Status: She is alert and oriented to person,  place, and time.  Psychiatric:        Mood and Affect: Mood normal.        Behavior: Behavior normal.   Results for orders placed or performed in visit on 01/20/24 (from the past 24 hours)  POCT HgB A1C     Status: Abnormal   Collection Time: 01/20/24 11:28 AM  Result Value Ref Range   Hemoglobin A1C 6.0 (A) 4.0 - 5.6 %   HbA1c POC (<> result, manual entry)     HbA1c, POC (prediabetic range)     HbA1c, POC (controlled diabetic range)       ASSESSMENT & PLAN: A total of 45 minutes was spent with the patient and counseling/coordination of care regarding preparing for this visit, review of most recent office visit notes, review of multiple chronic medical conditions and their management, cardiovascular risks associated with hypertension and diabetes, review of all medications, review of most recent bloodwork results including interpretation of today's hemoglobin A1c, review of health maintenance items, education on nutrition, prognosis, documentation, and need for follow up.   Problem List Items Addressed This Visit       Cardiovascular and Mediastinum   Hypertension associated with type 2 diabetes mellitus (HCC) - Primary   BP Readings from Last 3 Encounters:  01/20/24 126/84  06/16/23 130/78  03/24/23 138/75  Well-controlled hypertension and diabetes with hemoglobin A1c of 6.0 Cardiovascular risks associated with hypertension and diabetes discussed Diet and nutrition discussed Continue Lotrel 10-20 mg daily Continue weekly Trulicity  1.5 mg and metformin  1000 mg twice a day Intolerant to statin Follow-up in 6 months      Chronic ischemic heart disease   Stable. No recent anginal  episodes. No concerns         Respiratory   OSA (obstructive sleep apnea)   Stable and well-controlled on CPAP treatment         Endocrine   Type 2 diabetes mellitus without complication, with long-term current use of insulin (HCC)   Relevant Orders   POCT HgB A1C (Completed)   Acquired  hypothyroidism   Clinically euthyroid. Continue Synthroid  50 mcg daily        Other   Class 2 severe obesity due to excess calories with serious comorbidity and body mass index (BMI) of 38.0 to 38.9 in adult [facility])   Continue weekly Trulicity  1.5 mg Diet and nutrition discussed Benefits of exercise discussed Advised to decrease amount of daily carbohydrate intake and daily calories and increase amount of plant-based protein in her diet      Patient Instructions  Diabetes Mellitus and Nutrition, Adult When you have diabetes, or diabetes mellitus, it is very important to have healthy eating habits because your blood sugar (glucose) levels are greatly affected by what you eat and drink. Eating healthy foods in the right amounts, at about the same times every day, can help you: Manage your blood glucose. Lower your risk of heart disease. Improve your blood pressure. Reach or maintain a healthy weight. What can affect my meal plan? Every person with diabetes is different, and each person has different needs for a meal plan. Your health care provider may recommend that you work with a dietitian to make a meal plan that is best for you. Your meal plan may vary depending on factors such as: The calories you need. The medicines you take. Your weight. Your blood glucose, blood pressure, and cholesterol levels. Your activity level. Other health conditions you have, such as heart or kidney disease. How do carbohydrates affect me? Carbohydrates, also called carbs, affect your blood glucose level more than any other type of food. Eating carbs raises the amount of glucose in your blood. It is important to know how many carbs you can safely have in each meal. This is different for every person. Your dietitian can help you calculate how many carbs you should have at each meal and for each snack. How does alcohol affect me? Alcohol can cause a decrease in blood glucose (hypoglycemia), especially if  you use insulin or take certain diabetes medicines by mouth. Hypoglycemia can be a life-threatening condition. Symptoms of hypoglycemia, such as sleepiness, dizziness, and confusion, are similar to symptoms of having too much alcohol. Do not drink alcohol if: Your health care provider tells you not to drink. You are pregnant, may be pregnant, or are planning to become pregnant. If you drink alcohol: Limit how much you have to: 0-1 drink a day for women. 0-2 drinks a day for men. Know how much alcohol is in your drink. In the [place] one drink equals one 12 oz bottle of beer (355 mL), one 5 oz glass of wine (148 mL), or one 1 oz glass of hard liquor (44 mL). Keep yourself hydrated with water , diet soda, or unsweetened iced tea. Keep in mind that regular soda, juice, and other mixers may contain a lot of sugar and must be counted as carbs. What are tips for following this plan?  Reading food labels Start by checking the serving size on the Nutrition Facts label of packaged foods and drinks. The number of calories and the amount of carbs, fats, and other nutrients listed on the label are based on one  serving of the item. Many items contain more than one serving per package. Check the total grams (g) of carbs in one serving. Check the number of grams of saturated fats and trans fats in one serving. Choose foods that have a low amount or none of these fats. Check the number of milligrams (mg) of salt (sodium) in one serving. Most people should limit total sodium intake to less than 2,300 mg per day. Always check the nutrition information of foods labeled as "low-fat" or "nonfat." These foods may be higher in added sugar or refined carbs and should be avoided. Talk to your dietitian to identify your daily goals for nutrients listed on the label. Shopping Avoid buying canned, pre-made, or processed foods. These foods tend to be high in fat, sodium, and added sugar. Shop around the outside edge of the  grocery store. This is where you will most often find fresh fruits and vegetables, bulk grains, fresh meats, and fresh dairy products. Cooking Use low-heat cooking methods, such as baking, instead of high-heat cooking methods, such as deep frying. Cook using healthy oils, such as olive, canola, or sunflower oil. Avoid cooking with butter, cream, or high-fat meats. Meal planning Eat meals and snacks regularly, preferably at the same times every day. Avoid going long periods of time without eating. Eat foods that are high in fiber, such as fresh fruits, vegetables, beans, and whole grains. Eat 4-6 oz (112-168 g) of lean protein each day, such as lean meat, chicken, fish, eggs, or tofu. One ounce (oz) (28 g) of lean protein is equal to: 1 oz (28 g) of meat, chicken, or fish. 1 egg.  cup (62 g) of tofu. Eat some foods each day that contain healthy fats, such as avocado, nuts, seeds, and fish. What foods should I eat? Fruits Berries. Apples. Oranges. Peaches. Apricots. Plums. Grapes. Mangoes. Papayas. Pomegranates. Kiwi. Cherries. Vegetables Leafy greens, including lettuce, spinach, kale, chard, collard greens, mustard greens, and cabbage. Beets. Cauliflower. Broccoli. Carrots. Green beans. Tomatoes. Peppers. Onions. Cucumbers. Brussels sprouts. Grains Whole grains, such as whole-wheat or whole-grain bread, crackers, tortillas, cereal, and pasta. Unsweetened oatmeal. Quinoa. Brown or wild rice. Meats and other proteins Seafood. Poultry without skin. Lean cuts of poultry and beef. Tofu. Nuts. Seeds. Dairy Low-fat or fat-free dairy products such as milk, yogurt, and cheese. The items listed above may not be a complete list of foods and beverages you can eat and drink. Contact a dietitian for more information. What foods should I avoid? Fruits Fruits canned with syrup. Vegetables Canned vegetables. Frozen vegetables with butter or cream sauce. Grains Refined white flour and flour products  such as bread, pasta, snack foods, and cereals. Avoid all processed foods. Meats and other proteins Fatty cuts of meat. Poultry with skin. Breaded or fried meats. Processed meat. Avoid saturated fats. Dairy Full-fat yogurt, cheese, or milk. Beverages Sweetened drinks, such as soda or iced tea. The items listed above may not be a complete list of foods and beverages you should avoid. Contact a dietitian for more information. Questions to ask a health care provider Do I need to meet with a certified diabetes care and education specialist? Do I need to meet with a dietitian? What number can I call if I have questions? When are the best times to check my blood glucose? Where to find more information: American Diabetes Association: diabetes.org Academy of Nutrition and Dietetics: eatright.[employer] of Diabetes and Digestive and Kidney Diseases: StageSync.si Association of Diabetes Care & Education Specialists:  diabeteseducator.org Summary It is important to have healthy eating habits because your blood sugar (glucose) levels are greatly affected by what you eat and drink. It is important to use alcohol carefully. A healthy meal plan will help you manage your blood glucose and lower your risk of heart disease. Your health care provider may recommend that you work with a dietitian to make a meal plan that is best for you. This information is not intended to replace advice given to you by your health care provider. Make sure you discuss any questions you have with your health care provider. Document Revised: 03/12/2020 Document Reviewed: 03/13/2020 Elsevier Patient Education  2024 Elsevier Inc.     Maryagnes Small, MD  Primary [facility] at [facility]

## 2024-01-20 NOTE — Assessment & Plan Note (Signed)
 Continue weekly Trulicity  1.5 mg Diet and nutrition discussed Benefits of exercise discussed Advised to decrease amount of daily carbohydrate intake and daily calories and increase amount of plant-based protein in her diet

## 2024-01-20 NOTE — Patient Instructions (Signed)

## 2024-01-20 NOTE — Assessment & Plan Note (Signed)
Clinically euthyroid. Continue Synthroid 50 mcg daily. 

## 2024-01-20 NOTE — Assessment & Plan Note (Signed)
Stable.  No recent anginal episodes.  No concerns.

## 2024-01-24 ENCOUNTER — Other Ambulatory Visit: Payer: Self-pay | Admitting: Emergency Medicine

## 2024-01-24 DIAGNOSIS — Z1231 Encounter for screening mammogram for malignant neoplasm of breast: Secondary | ICD-10-CM

## 2024-02-16 ENCOUNTER — Ambulatory Visit
Admission: RE | Admit: 2024-02-16 | Discharge: 2024-02-16 | Disposition: A | Discharge status: Home / Self Care | Source: Ambulatory Visit | Attending: Emergency Medicine

## 2024-02-16 DIAGNOSIS — Z1231 Encounter for screening mammogram for malignant neoplasm of breast: Secondary | ICD-10-CM

## 2024-03-22 ENCOUNTER — Telehealth: Payer: Self-pay | Admitting: Family Medicine

## 2024-03-22 NOTE — Progress Notes (Unsigned)
 PATIENT: Katherine Sanchez DOB: 01/07/1956  REASON FOR VISIT: follow up HISTORY FROM: patient PRIMARY NEUROLOGIST: Dr. Buck  No chief complaint on file.    HISTORY OF PRESENT ILLNESS:  03/22/24 ALL: Katherine Sanchez returns for follow up for OSA on CPAP.   03/24/2023 ALL:  Katherine Sanchez returns for follow up for OSA on CPAP. She continues to do well on therapy. She uses her machine anytime she is asleep. She does note improvement in sleep quality. She is using an Airfit F30i in medium and feels it fits well. She has been trying to sleep on her back more to prevent it from leaking. No concerns with machine or supplies.     03/18/2022 ALL: Katherine Sanchez returns for follow up for OSA on CPAP. She continues to do well on therapy. She is using her machine nightly for about 8 hours. She does not benefit of using CPAP. She has noticed a leak in her mask. She reports needing to change mask and headgear. She will monitor at home.      03/12/2021 MM: Katherine Sanchez is a 68 year old female with a history of obstructive sleep apnea on CPAP.  She returns today for follow-up.  She reports that the CPAP is working well.  She really enjoys her new CPAP.  She states occasionally during the night that pressure may feel too high but she does not want to adjust it at this time.  She returns today for an evaluation.      REVIEW OF SYSTEMS: Out of a complete 14 system review of symptoms, the patient complains only of the following symptoms, none and all other reviewed systems are negative.   ESS 4/24, previously 7   ALLERGIES: Allergies  Allergen Reactions   Amoxicillin     hives hives hives    HOME MEDICATIONS: Outpatient Medications Prior to Visit  Medication Sig Dispense Refill   albuterol  (VENTOLIN  HFA) 108 (90 Base) MCG/ACT inhaler Inhale 1-2 puffs into the lungs every 6 (six) hours as needed for wheezing or shortness of breath. 18 g 0   amLODipine -benazepril  (LOTREL) 10-20 MG capsule Take 1 capsule  by mouth daily. 90 capsule 3   Ascorbic Acid  (VITAMIN C ) POWD Take 1,000 mg by mouth daily.     aspirin 81 MG tablet Take 81 mg by mouth daily.     blood glucose meter kit and supplies KIT Dispense based on patient and insurance preference. Use up to one time as directed  (FOR ICD-9 250.00, 250.01). 1 each 0   docusate sodium (COLACE) 100 MG capsule Take 100 mg by mouth 2 (two) times daily as needed.     Dulaglutide  (TRULICITY ) 1.5 MG/0.5ML SOAJ Inject 1.5 mg into the skin once a week. 2 mL 7   fluticasone  (FLONASE ) 50 MCG/ACT nasal spray SPRAY 1 SPRAY INTO EACH NOSTRIL DAILY 16 mL 5   glucose blood (ONE TOUCH ULTRA TEST) test strip USE UP TO 1 TIME AS DIRECTED 25 each 3   ibuprofen  (ADVIL ) 600 MG tablet Take 1 tablet (600 mg total) by mouth every 8 (eight) hours as needed. 21 tablet 0   levothyroxine  (SYNTHROID ) 50 MCG tablet TAKE 1 TABLET BY MOUTH EVERY DAY 90 tablet 2   meloxicam  (MOBIC ) 15 MG tablet Take 1 tablet (15 mg total) by mouth daily as needed for pain. 90 tablet 0   metFORMIN  (GLUCOPHAGE ) 1000 MG tablet Take 1 tablet (1,000 mg total) by mouth 2 (two) times daily with a meal. 180 tablet 3   OneTouch  Delica Lancets 33G MISC USE UP TO 1 TIME AS DIRECTED 100 each 11   No facility-administered medications prior to visit.    PAST MEDICAL HISTORY: Past Medical History:  Diagnosis Date   Abdominal distension    Allergy    per pt   Anxiety    Arthritis    knees    Constipation    Diabetes mellitus without complication (HCC)    GERD (gastroesophageal reflux disease)    Heart murmur    Hyperlipidemia    Hypertension    Hypothyroidism    Incisional hernia    Pneumonia    hx of 2006    Seasonal allergies    Sleep apnea    cpap x 10 yrs Dr Corrie    Thyroid  disease     PAST SURGICAL HISTORY: Past Surgical History:  Procedure Laterality Date   APPENDECTOMY  1982   CARDIAC CATHETERIZATION     1999 and negative   CHOLECYSTECTOMY  2010   lap with open redo umbilical hernia  repair.  Dr. Lorriane   COLONOSCOPY  11/18/2016   HERNIA REPAIR  2004   umbilical open w mesh plug. Dr. Adel and 2010 Dr Lorriane    KNEE SURGERY  2006   left - arthroscopic   VENTRAL HERNIA REPAIR  08/13/2011   Procedure: LAPAROSCOPIC VENTRAL HERNIA;  Surgeon: Elspeth KYM Schultze, MD;  Location: [facility];  Service: General;  Laterality: N/A;  Laparoscopic Ventral Hernia Repair with Mesh    FAMILY HISTORY: Family History  Problem Relation Age of Onset   Cancer Mother        colon   Heart disease Mother 59       congestive heart failure   COPD Mother    Colon cancer Mother 88   Other Father 71       car accident   Cancer Maternal Grandmother        ovarian   Asthma Son    Allergies Son    Hypertension Paternal Grandmother    Cancer Paternal Grandfather        prostate   Breast cancer Neg Hx    Colon polyps Neg Hx    Esophageal cancer Neg Hx    Rectal cancer Neg Hx    Stomach cancer Neg Hx     SOCIAL HISTORY: Social History   Socioeconomic History   Marital status: Married    Spouse name: Not on file   Number of children: 2   Years of education: Not on file   Highest education level: Not on file  Occupational History   Occupation: Conservation officer, 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   Occupation: care taker  Tobacco Use   Smoking status: Former    Current packs/day: 0.00    Average packs/day: 0.1 packs/day for 4.0 years (0.4 ttl pk-yrs)    Types: Cigarettes    Start date: 07/07/1974    Quit date: 07/07/1978    Years since quitting: 45.7   Smokeless tobacco: Never   Tobacco comments:    occassional smoker with alcohol consumption  Vaping Use   Vaping status: Never Used  Substance and Sexual Activity   Alcohol use: No    Comment: quit 1979   Drug use: No   Sexual activity: Yes    Birth control/protection: None    Comment: number of sex partners in the last 12 months 1  Other Topics Concern   Not on file  Social History Narrative   Lives with husband and his mother is  currently living with them after sustaining a fall in Oct. 2013.   Exercise walking 2 times daily   Social Drivers of Health   Financial Resource Strain: Not on file  Food Insecurity: Not on file  Transportation Needs: Not on file  Physical Activity: Not on file  Stress: Not on file  Social Connections: Not on file  Intimate Partner Violence: Not on file      PHYSICAL EXAM  There were no vitals filed for this visit.    There is no height or weight on file to calculate BMI.  Generalized: Well developed, in no acute distress  Chest: Lungs clear to auscultation bilaterally  Neurological examination  Mentation: Alert oriented to time, place, history taking. Follows all commands speech and language fluent Cranial nerve II-XII: Extraocular movements were full, visual field were full on confrontational test Head turning and shoulder shrug  were normal and symmetric. Motor: The motor testing reveals 5 over 5 strength of all 4 extremities. Good symmetric motor tone is noted throughout.  Sensory: Sensory testing is intact to soft touch on all 4 extremities. No evidence of extinction is noted.  Gait and station: Gait is arthritic, out-toeing    DIAGNOSTIC DATA (LABS, IMAGING, TESTING) - I reviewed patient records, labs, notes, testing and imaging myself where available.  Lab Results  Component Value Date   WBC 6.4 06/16/2023   HGB 14.0 06/16/2023   HCT 43.3 06/16/2023   MCV 87.9 06/16/2023   PLT 271.0 06/16/2023      Component Value Date/Time   NA 136 06/16/2023 1542   NA 139 12/14/2020 1937   K 4.1 06/16/2023 1542   CL 103 06/16/2023 1542   CO2 27 06/16/2023 1542   GLUCOSE 93 06/16/2023 1542   BUN 15 06/16/2023 1542   BUN 17 12/14/2020 1937   CREATININE 0.59 06/16/2023 1542   CREATININE 0.69 06/12/2016 1311   CALCIUM 9.5 06/16/2023 1542   PROT 7.6 06/16/2023 1542   PROT 7.6 12/14/2020 1937   ALBUMIN 4.5 06/16/2023 1542   ALBUMIN 4.5 12/14/2020 1937   AST 38 (H)  06/16/2023 1542   ALT 45 (H) 06/16/2023 1542   ALKPHOS 88 06/16/2023 1542   BILITOT 0.5 06/16/2023 1542   BILITOT 0.3 12/14/2020 1937   GFRNONAA 96 08/29/2020 1556   GFRNONAA >89 03/24/2015 1125   GFRAA 111 08/29/2020 1556   GFRAA >89 03/24/2015 1125   Lab Results  Component Value Date   CHOL 180 06/16/2023   HDL 53.50 06/16/2023   LDLCALC 94 06/16/2023   TRIG 163.0 (H) 06/16/2023   CHOLHDL 3 06/16/2023   Lab Results  Component Value Date   HGBA1C 6.0 (A) 01/20/2024   Lab Results  Component Value Date   VITAMINB12 181 (L) 02/22/2018   Lab Results  Component Value Date   TSH 3.22 06/23/2022      ASSESSMENT AND PLAN 67 y.o. year old female  has a past medical history of Abdominal distension, Allergy, Anxiety, Arthritis, Constipation, Diabetes mellitus without complication (HCC), GERD (gastroesophageal reflux disease), Heart murmur, Hyperlipidemia, Hypertension, Hypothyroidism, Incisional hernia, Pneumonia, Seasonal allergies, Sleep apnea, and Thyroid  disease. here with:  OSA on CPAP  - CPAP compliance excellent - AHI slightly elevated, encouraged to sleep on side as much as possible. Will recheck download   in 6-8 weeks.  - continue to monitor leak at home  - Encourage patient to use CPAP nightly and > 4 hours each night - F/U in 1 year or sooner if needed  Greig Forbes, MSN, FNP-C 03/22/2024, 2:03 PM [facility] [address] [place], [place] [address] [phone]

## 2024-03-22 NOTE — Progress Notes (Unsigned)
 Katherine Sanchez

## 2024-03-22 NOTE — Telephone Encounter (Signed)
 Appointment details confirmed

## 2024-03-22 NOTE — Patient Instructions (Signed)

## 2024-03-23 ENCOUNTER — Encounter: Payer: Self-pay | Admitting: Family Medicine

## 2024-03-23 ENCOUNTER — Ambulatory Visit: Payer: Federal, State, Local not specified - PPO | Admitting: Family Medicine

## 2024-03-23 VITALS — BP 134/75 | HR 73 | Ht 62.0 in | Wt 220.5 lb

## 2024-03-23 DIAGNOSIS — G4733 Obstructive sleep apnea (adult) (pediatric): Secondary | ICD-10-CM

## 2024-06-07 ENCOUNTER — Other Ambulatory Visit: Payer: Self-pay | Admitting: Emergency Medicine

## 2024-06-07 DIAGNOSIS — I152 Hypertension secondary to endocrine disorders: Secondary | ICD-10-CM

## 2024-07-11 ENCOUNTER — Other Ambulatory Visit: Payer: Self-pay | Admitting: Emergency Medicine

## 2024-07-11 DIAGNOSIS — E1159 Type 2 diabetes mellitus with other circulatory complications: Secondary | ICD-10-CM

## 2024-07-11 NOTE — Telephone Encounter (Unsigned)
 Copied from CRM [id number]. Topic: Clinical - Medication Refill >> Jul 11, 2024  9:22 AM Katherine Sanchez wrote: Medication: Dulaglutide  (TRULICITY ) 1.5 MG/0.5ML Katherine Sanchez [[id number]]  Has the patient contacted their pharmacy? YES (Agent: If no, request that the patient contact the pharmacy for the refill. If patient does not wish to contact the pharmacy document the reason why and proceed with request.) (Agent: If yes, when and what did the pharmacy advise?) They said to contact provider  This is the patient's preferred pharmacy:  [facility] [address], [place] - [address] [place] [address] Phone: [phone] Fax: [phone]  Is this the correct pharmacy for this prescription? Yes If no, delete pharmacy and type the correct one.   Has the prescription been filled recently? Yes  Is the patient out of the medication? Yes  Has the patient been seen for an appointment in the last year OR does the patient have an upcoming appointment? Yes  Can we respond through MyChart? Yes  Agent: Please be advised that Rx refills may take up to 3 business days. We ask that you follow-up with your pharmacy.

## 2024-07-12 NOTE — Telephone Encounter (Unsigned)
 Copied from CRM [id number]. Topic: Clinical - Medication Refill >> Jul 12, 2024 10:37 AM Franky GRADE wrote: Patient Is calling to follow up on the refill request, advised patient of the turn around time;however, she wanted to advise she is completely out and would like to know if there is anyway to expedite the process.

## 2024-07-13 MED ORDER — TRULICITY 1.5 MG/0.5ML ~~LOC~~ SOAJ: 1.5000 mg | SUBCUTANEOUS | 7 refills | Status: AC

## 2024-07-17 ENCOUNTER — Telehealth: Payer: Self-pay

## 2024-07-17 ENCOUNTER — Other Ambulatory Visit (HOSPITAL_COMMUNITY): Payer: Self-pay

## 2024-07-17 NOTE — Telephone Encounter (Signed)
 Copied from CRM #[id number]. Topic: Clinical - Medication Prior Auth >> Jul 14, 2024  5:17 PM Lauren C wrote: Reason for CRM: Pt calling to let us  know prior auth is needed for Dulaglutide  (TRULICITY ) 1.5 MG/0.5ML SOAJ. She says the number for the PA department with her insurance is [phone] and they are open until 9PM.

## 2024-07-17 NOTE — Telephone Encounter (Signed)
 Called patient and infomred her that it has been approved and she may have it filled at her pharmacy

## 2024-07-17 NOTE — Telephone Encounter (Signed)
 Pharmacy Patient Advocate Encounter  Received notification from [facility] that Prior Authorization for Trulicity  1.5mg /0.20ml has been APPROVED from 07/17/24 to 07/17/25   PA #/Case ID/Reference #: [id number]

## 2024-07-17 NOTE — Telephone Encounter (Signed)
 Copied from CRM [id number]. Topic: General - Other >> Jul 17, 2024 11:11 AM Nessti S wrote: Reason for CRM: pt called to check on status of prior auth. She would like call back to number [phone]

## 2024-07-17 NOTE — Telephone Encounter (Signed)
 Pharmacy Patient Advocate Encounter   Received notification from Pt Calls Messages that prior authorization for Trulicity  1.5mg /0.51ml is required/requested.   Insurance verification completed.   The patient is insured through [facility].   Per test claim: PA required; PA submitted to above mentioned insurance via Latent Key/confirmation #/EOC [id number] Status is pending

## 2024-07-24 ENCOUNTER — Ambulatory Visit: Admitting: Emergency Medicine

## 2024-07-25 ENCOUNTER — Ambulatory Visit: Payer: Self-pay

## 2024-07-25 NOTE — Telephone Encounter (Signed)
 FYI Only or Action Required?: FYI only for provider: Home care.  Patient was last seen in primary care on 01/20/2024 by Purcell Emil Schanz, MD.  Called Nurse Triage reporting Diarrhea.  Symptoms began several days ago.  Interventions attempted: OTC medications: Imodium, theraflu.  Symptoms are: completely resolved.  Triage Disposition: Home Care  Patient/caregiver understands and will follow disposition?:    Reason for Disposition  MILD-MODERATE diarrhea (e.g., 1-6 times / day more than normal)  Answer Assessment - Initial Assessment Questions Patient states she was unsure if it was the stomach bug or from something she ate. Went out to eat Saturday , but also went to nursing home to visit her father and father in law. Took OTC theraflu and imodium. Patient has not had a bowel movement yet today.   1. DIARRHEA SEVERITY: How bad is the diarrhea? How many more stools have you had in the past 24 hours than normal?      No diarrhea since yesterday at 11am  2. ONSET: When did the diarrhea begin?      Sunday  3. STOOL DESCRIPTION:  How loose or watery is the diarrhea? What is the stool color? Is there any blood or mucous in the stool?     Loose  4. VOMITING: Are you also vomiting? If Yes, ask: How many times in the past 24 hours?      Denies  5. ABDOMEN PAIN: Are you having any abdomen pain? If Yes, ask: What does it feel like? (e.g., crampy, dull, intermittent, constant)      Mild cramps   6. ABDOMEN PAIN SEVERITY: If present, ask: How bad is the pain?  (e.g., Scale 1-10; mild, moderate, or severe)     Mild  7. ORAL INTAKE: If vomiting, Have you been able to drink liquids? How much liquids have you had in the past 24 hours?     Yes  8. HYDRATION: Any signs of dehydration? (e.g., dry mouth [not just dry lips], too weak to stand, dizziness, new weight loss) When did you last urinate?     Denies signs of dehydration  9. ANTIBIOTIC USE: Are you  taking antibiotics now or have you taken antibiotics in the past 2 months?       Denies  10. OTHER SYMPTOMS: Do you have any other symptoms? (e.g., fever, blood in stool)       Slight nausea, which has subsided.  Protocols used: [facility]  Message from Haysi C sent at 07/25/2024 11:18 AM EST  Reason for Triage: Patient states may have stomach but with bad diarrhea, an request a call back.

## 2024-07-28 ENCOUNTER — Other Ambulatory Visit: Payer: Self-pay | Admitting: Emergency Medicine

## 2024-08-09 ENCOUNTER — Encounter: Payer: Self-pay | Admitting: Family Medicine

## 2024-08-22 NOTE — Telephone Encounter (Signed)
 Pt called  stating that she spoke with DME  and they have been trying to reach out to Our office they need new order with Pressure written on order  to give Pt supple's

## 2024-08-28 NOTE — Telephone Encounter (Signed)
 Amy do you mind placing a new order with cpap pressure on order?  Thanks.

## 2024-08-28 NOTE — Telephone Encounter (Signed)
Community message sent to [employer]. 

## 2024-08-29 NOTE — Telephone Encounter (Signed)
 Pt called to request to speak to CMA  Pt doesn't have access to Mychart

## 2024-08-29 NOTE — Telephone Encounter (Signed)
 Please call patient back and get scheduled for office required by insurance. She can be scheduled with any NP for cpap follow.  See note in my chart message from me

## 2024-09-03 ENCOUNTER — Other Ambulatory Visit: Payer: Self-pay | Admitting: Emergency Medicine

## 2024-09-03 DIAGNOSIS — E1159 Type 2 diabetes mellitus with other circulatory complications: Secondary | ICD-10-CM

## 2024-09-04 NOTE — Telephone Encounter (Signed)
 Pt has been scheduled for her needed CPAP f/u

## 2024-09-07 NOTE — Progress Notes (Unsigned)
 Katherine Sanchez

## 2024-09-08 ENCOUNTER — Ambulatory Visit (INDEPENDENT_AMBULATORY_CARE_PROVIDER_SITE_OTHER): Payer: No Typology Code available for payment source | Admitting: Family Medicine

## 2024-09-08 ENCOUNTER — Encounter: Payer: Self-pay | Admitting: Family Medicine

## 2024-09-08 VITALS — BP 136/74 | HR 77 | Ht 62.0 in | Wt 227.2 lb

## 2024-09-08 DIAGNOSIS — G4733 Obstructive sleep apnea (adult) (pediatric): Secondary | ICD-10-CM | POA: Diagnosis not present

## 2024-09-08 NOTE — Progress Notes (Signed)
 SABRA

## 2024-09-08 NOTE — Patient Instructions (Signed)

## 2024-09-08 NOTE — Progress Notes (Signed)
 "   PATIENT: Katherine Sanchez DOB: 1955-09-04  REASON FOR VISIT: follow up HISTORY FROM: patient PRIMARY NEUROLOGIST: Dr. Buck  Chief Complaint  Patient presents with   RM 2     Patient is here for a CPAP follow up - patient needs supplies and due to her new insurance she needed another appointment. ( Medicare since Nov. With [employer] as a secondary)      HISTORY OF PRESENT ILLNESS:  09/08/24 ALL: Katherine Sanchez return for follow up for OSA on CPAP. She was last seen 02/2024 and doing well. She has switched insurance plans and they required new visit for supplies. She continues to do very well. She does not sleep without her machine. She does feel pressure is strong when she wakes at night but this has not been overly bothersome. Data reviewed on patient app and shows 100% compliance with AHI around 4/hr. No significant leak.    03/23/2024 ALL:  Katherine Sanchez returns for follow up for OSA on CPAP. She continues to do well on therapy. She is using CPAP nightly for about 7.5 hours, on average. She denies concerns with machine or supplies. She does note benefit of using therapy. She is followed regularly by PCP.     03/24/2023 ALL:  Katherine Sanchez returns for follow up for OSA on CPAP. She continues to do well on therapy. She uses her machine anytime she is asleep. She does note improvement in sleep quality. She is using an Airfit F30i in medium and feels it fits well. She has been trying to sleep on her back more to prevent it from leaking. No concerns with machine or supplies.     03/18/2022 ALL: Katherine Sanchez returns for follow up for OSA on CPAP. She continues to do well on therapy. She is using her machine nightly for about 8 hours. She does not benefit of using CPAP. She has noticed a leak in her mask. She reports needing to change mask and headgear. She will monitor at home.      03/12/2021 MM: [name] is a 69 year old female with a history of obstructive sleep apnea on CPAP.  She returns today  for follow-up.  She reports that the CPAP is working well.  She really enjoys her new CPAP.  She states occasionally during the night that pressure may feel too high but she does not want to adjust it at this time.  She returns today for an evaluation.      REVIEW OF SYSTEMS: Out of a complete 14 system review of symptoms, the patient complains only of the following symptoms, none and all other reviewed systems are negative.   ESS 4/24, previously 7   ALLERGIES: Allergies  Allergen Reactions   Amoxicillin     hives hives hives    HOME MEDICATIONS: Outpatient Medications Prior to Visit  Medication Sig Dispense Refill   amLODipine -benazepril  (LOTREL) 10-20 MG capsule TAKE 1 CAPSULE BY MOUTH EVERY DAY 90 capsule 3   Ascorbic Acid  (VITAMIN C ) POWD Take 1,000 mg by mouth daily.     aspirin 81 MG tablet Take 81 mg by mouth daily.     blood glucose meter kit and supplies KIT Dispense based on patient and insurance preference. Use up to one time as directed  (FOR ICD-9 250.00, 250.01). 1 each 0   docusate sodium (COLACE) 100 MG capsule Take 100 mg by mouth 2 (two) times daily as needed.     Dulaglutide  (TRULICITY ) 1.5 MG/0.5ML SOAJ Inject 1.5 mg as directed once  a week. 2 mL 7   fluticasone  (FLONASE ) 50 MCG/ACT nasal spray SPRAY 1 SPRAY INTO EACH NOSTRIL DAILY 48 mL 1   glucose blood (ONE TOUCH ULTRA TEST) test strip USE UP TO 1 TIME AS DIRECTED 25 each 3   ibuprofen  (ADVIL ) 600 MG tablet Take 1 tablet (600 mg total) by mouth every 8 (eight) hours as needed. 21 tablet 0   levothyroxine  (SYNTHROID ) 50 MCG tablet TAKE 1 TABLET BY MOUTH EVERY DAY 90 tablet 2   meloxicam  (MOBIC ) 15 MG tablet Take 1 tablet (15 mg total) by mouth daily as needed for pain. 90 tablet 0   metFORMIN  (GLUCOPHAGE ) 1000 MG tablet TAKE 1 TABLET (1,000 MG TOTAL) BY MOUTH TWICE A DAY WITH FOOD 180 tablet 3   OneTouch Delica Lancets 33G MISC USE UP TO 1 TIME AS DIRECTED 100 each 11   albuterol  (VENTOLIN  HFA) 108 (90  Base) MCG/ACT inhaler Inhale 1-2 puffs into the lungs every 6 (six) hours as needed for wheezing or shortness of breath. (Patient not taking: Reported on 09/08/2024) 18 g 0   No facility-administered medications prior to visit.    PAST MEDICAL HISTORY: Past Medical History:  Diagnosis Date   Abdominal distension    Allergy    per pt   Anxiety    Arthritis    knees    Constipation    Diabetes mellitus without complication (HCC)    GERD (gastroesophageal reflux disease)    Heart murmur    Hyperlipidemia    Hypertension    Hypothyroidism    Incisional hernia    Pneumonia    hx of 2006    Seasonal allergies    Sleep apnea    cpap x 10 yrs Dr Corrie    Thyroid  disease     PAST SURGICAL HISTORY: Past Surgical History:  Procedure Laterality Date   APPENDECTOMY  1982   CARDIAC CATHETERIZATION     1999 and negative   CHOLECYSTECTOMY  2010   lap with open redo umbilical hernia repair.  Dr. Lorriane   COLONOSCOPY  11/18/2016   HERNIA REPAIR  2004   umbilical open w mesh plug. Dr. Adel and 2010 Dr Lorriane    KNEE SURGERY  2006   left - arthroscopic   REPLACEMENT TOTAL KNEE Bilateral 2023   VENTRAL HERNIA REPAIR  08/13/2011   Procedure: LAPAROSCOPIC VENTRAL HERNIA;  Surgeon: Elspeth KYM Schultze, MD;  Location: [facility];  Service: General;  Laterality: N/A;  Laparoscopic Ventral Hernia Repair with Mesh    FAMILY HISTORY: Family History  Problem Relation Age of Onset   Cancer Mother        colon   Heart disease Mother 40       congestive heart failure   COPD Mother    Colon cancer Mother 5   Other Father 22       car accident   Cancer Maternal Grandmother        ovarian   Hypertension Paternal Grandmother    Cancer Paternal Grandfather        prostate   Asthma Son    Allergies Son    Breast cancer Neg Hx    Colon polyps Neg Hx    Esophageal cancer Neg Hx    Rectal cancer Neg Hx    Stomach cancer Neg Hx    Seizures Neg Hx    Stroke Neg Hx    Migraines Neg Hx     Sleep apnea Neg Hx  SOCIAL HISTORY: Social History   Socioeconomic History   Marital status: Married    Spouse name: Not on file   Number of children: 2   Years of education: Not on file   Highest education level: Not on file  Occupational History   Occupation: conservation officer, 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   Occupation: care taker  Tobacco Use   Smoking status: Former    Current packs/day: 0.00    Average packs/day: 0.1 packs/day for 4.0 years (0.4 ttl pk-yrs)    Types: Cigarettes    Start date: 07/07/1974    Quit date: 07/07/1978    Years since quitting: 46.2   Smokeless tobacco: Never   Tobacco comments:    occassional smoker with alcohol consumption  Vaping Use   Vaping status: Never Used  Substance and Sexual Activity   Alcohol use: No    Comment: quit 1979   Drug use: No   Sexual activity: Yes    Birth control/protection: None    Comment: number of sex partners in the last 12 months 1  Other Topics Concern   Not on file  Social History Narrative   Lives with husband and his mother is currently living with them after sustaining a fall in Oct. 2013.Exercise walking 2 times daily   1 cup of coffee in the morning    Social Drivers of Health   Tobacco Use: Medium Risk (09/08/2024)   Patient History    Smoking Tobacco Use: Former    Smokeless Tobacco Use: Never    Passive Exposure: Not on Actuary Strain: Not on file  Food Insecurity: Not on file  Transportation Needs: Not on file  Physical Activity: Not on file  Stress: Not on file  Social Connections: Not on file  Intimate Partner Violence: Not on file  Depression (PHQ2-9): Low Risk (01/20/2024)   Depression (PHQ2-9)    PHQ-2 Score: 0  Alcohol Screen: Not on file  Housing: Not on file  Utilities: Not on file  Health Literacy: Not on file      PHYSICAL EXAM  Vitals:   09/08/24 0805  BP: 136/74  Pulse: 77  SpO2: 99%  Weight: 227 lb 3.2 oz (103.1 kg)  Height: 5' 2 (1.575 m)        Body mass index is 41.56 kg/m.  Generalized: Well developed, in no acute distress  Chest: Lungs clear to auscultation bilaterally  Neurological examination  Mentation: Alert oriented to time, place, history taking. Follows all commands speech and language fluent Cranial nerve II-XII: Extraocular movements were full, visual field were full on confrontational test Head turning and shoulder shrug  were normal and symmetric. Motor: The motor testing reveals 5 over 5 strength of all 4 extremities. Good symmetric motor tone is noted throughout.  Sensory: Sensory testing is intact to soft touch on all 4 extremities. No evidence of extinction is noted.  Gait and station: Gait is arthritic, out-toeing    DIAGNOSTIC DATA (LABS, IMAGING, TESTING) - I reviewed patient records, labs, notes, testing and imaging myself where available.  Lab Results  Component Value Date   WBC 6.4 06/16/2023   HGB 14.0 06/16/2023   HCT 43.3 06/16/2023   MCV 87.9 06/16/2023   PLT 271.0 06/16/2023      Component Value Date/Time   NA 136 06/16/2023 1542   NA 139 12/14/2020 1937   K 4.1 06/16/2023 1542   CL 103 06/16/2023 1542   CO2 27 06/16/2023 1542   GLUCOSE 93 06/16/2023 1542  BUN 15 06/16/2023 1542   BUN 17 12/14/2020 1937   CREATININE 0.59 06/16/2023 1542   CREATININE 0.69 06/12/2016 1311   CALCIUM 9.5 06/16/2023 1542   PROT 7.6 06/16/2023 1542   PROT 7.6 12/14/2020 1937   ALBUMIN 4.5 06/16/2023 1542   ALBUMIN 4.5 12/14/2020 1937   AST 38 (H) 06/16/2023 1542   ALT 45 (H) 06/16/2023 1542   ALKPHOS 88 06/16/2023 1542   BILITOT 0.5 06/16/2023 1542   BILITOT 0.3 12/14/2020 1937   GFRNONAA 96 08/29/2020 1556   GFRNONAA >89 03/24/2015 1125   GFRAA 111 08/29/2020 1556   GFRAA >89 03/24/2015 1125   Lab Results  Component Value Date   CHOL 180 06/16/2023   HDL 53.50 06/16/2023   LDLCALC 94 06/16/2023   TRIG 163.0 (H) 06/16/2023   CHOLHDL 3 06/16/2023   Lab Results  Component Value  Date   HGBA1C 6.0 (A) 01/20/2024   Lab Results  Component Value Date   VITAMINB12 181 (L) 02/22/2018   Lab Results  Component Value Date   TSH 3.22 06/23/2022      ASSESSMENT AND PLAN 69 y.o. year old female  has a past medical history of Abdominal distension, Allergy, Anxiety, Arthritis, Constipation, Diabetes mellitus without complication (HCC), GERD (gastroesophageal reflux disease), Heart murmur, Hyperlipidemia, Hypertension, Hypothyroidism, Incisional hernia, Pneumonia, Seasonal allergies, Sleep apnea, and Thyroid  disease. here with:  OSA on CPAP  - CPAP compliance excellent - AHI well managed. Will update supplies.  - Encourage patient to use CPAP nightly and > 4 hours each night - F/U in 1 year or sooner if needed    Greig Forbes, MSN, FNP-C 09/08/2024, 8:15 AM [facility] [address], [address] [place], [place] [address] [phone]   "

## 2024-09-11 ENCOUNTER — Telehealth: Payer: Self-pay

## 2024-09-11 NOTE — Telephone Encounter (Signed)
-----   Message from CMA Adreka W sent at 09/11/2024  9:18 AM EST -----  ----- Message ----- From: Cary No, NP Sent: 09/08/2024   8:37 AM EST To: Sherrod LITTIE Climes, CMA; Adreka D With#  Diandra, can you please make sure that we get orders signed and over to [employer], today. Patient is in need of supplies ASAP. Adreka, we had a report from 2022. Can you please attach current 30 day report? TY.

## 2024-09-11 NOTE — Telephone Encounter (Signed)
 Katherine Sanchez

## 2024-09-12 NOTE — Telephone Encounter (Signed)
 SABRA

## 2024-09-13 NOTE — Telephone Encounter (Signed)
 I called the patient to follow-up and her DME did contact the patient supplies will be arrive next week.

## 2025-03-26 ENCOUNTER — Ambulatory Visit: Admitting: Family Medicine

## 2025-09-03 ENCOUNTER — Ambulatory Visit: Admitting: Family Medicine
